# Patient Record
Sex: Female | Born: 1985 | Race: Black or African American | Hispanic: No | Marital: Single | Smoking: Current some day smoker
Health system: Southern US, Community
[De-identification: ages and names within clinical notes are randomized; demographics above are authoritative.]

## PROBLEM LIST (undated history)

## (undated) ENCOUNTER — Inpatient Hospital Stay (HOSPITAL_COMMUNITY): Payer: Self-pay

## (undated) DIAGNOSIS — R569 Unspecified convulsions: Secondary | ICD-10-CM

## (undated) DIAGNOSIS — J45909 Unspecified asthma, uncomplicated: Secondary | ICD-10-CM

## (undated) DIAGNOSIS — R87629 Unspecified abnormal cytological findings in specimens from vagina: Secondary | ICD-10-CM

## (undated) DIAGNOSIS — Z8619 Personal history of other infectious and parasitic diseases: Secondary | ICD-10-CM

## (undated) HISTORY — DX: Personal history of other infectious and parasitic diseases: Z86.19

## (undated) HISTORY — DX: Unspecified abnormal cytological findings in specimens from vagina: R87.629

---
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
## 2006-02-21 ENCOUNTER — Emergency Department (HOSPITAL_COMMUNITY): Admission: EM | Admit: 2006-02-21 | Discharge: 2006-02-21 | Payer: Self-pay | Admitting: Emergency Medicine

## 2006-07-02 ENCOUNTER — Emergency Department (HOSPITAL_COMMUNITY): Admission: EM | Admit: 2006-07-02 | Discharge: 2006-07-02 | Payer: Self-pay | Admitting: Emergency Medicine

## 2006-08-29 ENCOUNTER — Emergency Department (HOSPITAL_COMMUNITY): Admission: EM | Admit: 2006-08-29 | Discharge: 2006-08-29 | Payer: Self-pay | Admitting: Emergency Medicine

## 2006-09-16 ENCOUNTER — Ambulatory Visit (HOSPITAL_COMMUNITY): Admission: EM | Admit: 2006-09-16 | Discharge: 2006-09-16 | Payer: Self-pay | Admitting: Obstetrics and Gynecology

## 2006-09-20 ENCOUNTER — Emergency Department (HOSPITAL_COMMUNITY): Admission: EM | Admit: 2006-09-20 | Discharge: 2006-09-20 | Payer: Self-pay | Admitting: Emergency Medicine

## 2006-10-30 ENCOUNTER — Emergency Department (HOSPITAL_COMMUNITY): Admission: EM | Admit: 2006-10-30 | Discharge: 2006-10-30 | Payer: Self-pay | Admitting: Emergency Medicine

## 2006-11-19 ENCOUNTER — Emergency Department (HOSPITAL_COMMUNITY): Admission: EM | Admit: 2006-11-19 | Discharge: 2006-11-19 | Payer: Self-pay | Admitting: Physician Assistant

## 2007-08-25 ENCOUNTER — Emergency Department (HOSPITAL_COMMUNITY): Admission: EM | Admit: 2007-08-25 | Discharge: 2007-08-25 | Payer: Self-pay | Admitting: Emergency Medicine

## 2007-09-16 ENCOUNTER — Emergency Department (HOSPITAL_COMMUNITY): Admission: EM | Admit: 2007-09-16 | Discharge: 2007-09-16 | Payer: Self-pay | Admitting: Emergency Medicine

## 2007-10-01 ENCOUNTER — Emergency Department (HOSPITAL_COMMUNITY): Admission: EM | Admit: 2007-10-01 | Discharge: 2007-10-01 | Payer: Self-pay | Admitting: Emergency Medicine

## 2008-03-24 ENCOUNTER — Emergency Department (HOSPITAL_COMMUNITY): Admission: EM | Admit: 2008-03-24 | Discharge: 2008-03-24 | Payer: Self-pay | Admitting: Emergency Medicine

## 2008-07-22 ENCOUNTER — Emergency Department (HOSPITAL_COMMUNITY): Admission: EM | Admit: 2008-07-22 | Discharge: 2008-07-22 | Payer: Self-pay | Admitting: Emergency Medicine

## 2008-11-19 ENCOUNTER — Other Ambulatory Visit: Admission: RE | Admit: 2008-11-19 | Discharge: 2008-11-19 | Payer: Self-pay | Admitting: Obstetrics and Gynecology

## 2009-01-20 ENCOUNTER — Other Ambulatory Visit: Payer: Self-pay | Admitting: Emergency Medicine

## 2009-01-20 ENCOUNTER — Inpatient Hospital Stay (HOSPITAL_COMMUNITY): Admission: AD | Admit: 2009-01-20 | Discharge: 2009-01-20 | Payer: Self-pay | Admitting: Obstetrics & Gynecology

## 2009-01-20 ENCOUNTER — Ambulatory Visit: Payer: Self-pay | Admitting: Obstetrics and Gynecology

## 2009-02-20 ENCOUNTER — Ambulatory Visit: Payer: Self-pay | Admitting: Obstetrics and Gynecology

## 2009-02-20 ENCOUNTER — Inpatient Hospital Stay (HOSPITAL_COMMUNITY): Admission: AD | Admit: 2009-02-20 | Discharge: 2009-02-23 | Payer: Self-pay | Admitting: Obstetrics and Gynecology

## 2009-10-10 ENCOUNTER — Emergency Department (HOSPITAL_COMMUNITY): Admission: EM | Admit: 2009-10-10 | Discharge: 2009-10-10 | Payer: Self-pay | Admitting: Emergency Medicine

## 2010-02-11 ENCOUNTER — Ambulatory Visit: Payer: Self-pay | Admitting: Family Medicine

## 2010-02-11 ENCOUNTER — Observation Stay (HOSPITAL_COMMUNITY): Admission: AD | Admit: 2010-02-11 | Discharge: 2010-02-12 | Payer: Self-pay | Admitting: Obstetrics and Gynecology

## 2010-03-26 ENCOUNTER — Inpatient Hospital Stay (HOSPITAL_COMMUNITY)
Admission: AD | Admit: 2010-03-26 | Discharge: 2010-03-26 | Payer: Self-pay | Source: Home / Self Care | Attending: Obstetrics & Gynecology | Admitting: Obstetrics & Gynecology

## 2010-04-06 ENCOUNTER — Inpatient Hospital Stay (HOSPITAL_COMMUNITY)
Admission: RE | Admit: 2010-04-06 | Discharge: 2010-04-09 | Payer: Self-pay | Source: Home / Self Care | Attending: Obstetrics and Gynecology | Admitting: Obstetrics and Gynecology

## 2010-05-03 NOTE — Discharge Summary (Addendum)
  NAMEMERLIN, EGE                ACCOUNT NO.:  [id number]  MEDICAL RECORD NO.:  [id number]          PATIENT TYPE:  INP  LOCATION:  0802                          FACILITY:  [facility]  PHYSICIAN:  Emeterio Reeve, MD       DATE OF BIRTH:  03-Feb-1986  DATE OF ADMISSION:  04/06/2010 DATE OF DISCHARGE:  04/09/2010                              DISCHARGE SUMMARY   ADMITTING DIAGNOSIS:  Admission for repeat cesarean section.  HISTORY OF PRESENT ILLNESS:  The patient is a 25 year old gravida 97, now para 29, female who at 59 weeks estimated gestational age, who was admitted through Short Stay and subsequently postoperatively for a repeat low transverse cesarean section.  The patient has had 4 previous cesarean section, this is her fifth repeat, scheduled.  She had been offered a tubal ligation, but had declined that at the time of this cesarean section.  The several operative report is dictated by Dr. Mallory Shirk.  The patient had a preop evaluation which found her hemoglobin and hematocrit to be 11 and 34 respectively.  A postop hemoglobin and hematocrit on April 07, 2010, was 11.3 and 32.8.  The patient delivered a viable infant female and she is breast feeding, desires an IUD and is agreeable to Depo bridge.  DISCHARGE DIAGNOSIS:  Status post repeat low-transverse cesarean section at term pregnancy which is delivered.  Postoperative complications include none.  PLAN:  The patient will be discharged home on Percocet, Motrin, prenatal vitamins, Colace, and iron.  The patient is asked to follow up in approximately 4 weeks postpartum to [facility], so she can discuss and orient her plan to have her IUD placed.  She agrees to having a Depo shot at the time of discharge.    ______________________________ Lynelle Smoke, DO   ______________________________ Emeterio Reeve, MD    SH/MEDQ  D:  04/09/2010  T:  04/10/2010  Job:  [id number]  Electronically Signed by Lynelle Smoke MD on  05/03/2010 09:13:44 AM Electronically Signed by Emeterio Reeve MD on 05/03/2010 11:42:37 AM

## 2010-06-09 ENCOUNTER — Emergency Department (HOSPITAL_COMMUNITY)
Admission: EM | Admit: 2010-06-09 | Discharge: 2010-06-09 | Disposition: A | Discharge status: Home / Self Care | Payer: Medicaid Other | Attending: Emergency Medicine | Admitting: Emergency Medicine

## 2010-06-09 DIAGNOSIS — IMO0001 Reserved for inherently not codable concepts without codable children: Secondary | ICD-10-CM | POA: Insufficient documentation

## 2010-06-09 DIAGNOSIS — J111 Influenza due to unidentified influenza virus with other respiratory manifestations: Secondary | ICD-10-CM | POA: Insufficient documentation

## 2010-06-09 DIAGNOSIS — R112 Nausea with vomiting, unspecified: Secondary | ICD-10-CM | POA: Insufficient documentation

## 2010-06-09 LAB — URINALYSIS, ROUTINE W REFLEX MICROSCOPIC
Bilirubin Urine: NEGATIVE
Nitrite: NEGATIVE
Protein, ur: NEGATIVE mg/dL
Specific Gravity, Urine: 1.025 (ref 1.005–1.030)
Urine Glucose, Fasting: NEGATIVE mg/dL
pH: 6 (ref 5.0–8.0)

## 2010-06-09 LAB — URINE MICROSCOPIC-ADD ON

## 2010-06-21 LAB — CBC
HCT: 32.8 % — ABNORMAL LOW (ref 36.0–46.0)
Hemoglobin: 11.3 g/dL — ABNORMAL LOW (ref 12.0–15.0)
MCH: 32.8 pg (ref 26.0–34.0)
MCH: 33.4 pg (ref 26.0–34.0)
MCHC: 33.4 g/dL (ref 30.0–36.0)
MCV: 98 fL (ref 78.0–100.0)
Platelets: 197 10*3/uL (ref 150–400)
Platelets: 212 10*3/uL (ref 150–400)
RBC: 3.38 MIL/uL — ABNORMAL LOW (ref 3.87–5.11)
WBC: 10 10*3/uL (ref 4.0–10.5)

## 2010-06-21 LAB — SURGICAL PCR SCREEN
MRSA, PCR: NEGATIVE
Staphylococcus aureus: NEGATIVE

## 2010-06-21 LAB — GC/CHLAMYDIA PROBE AMP, GENITAL
Chlamydia, DNA Probe: POSITIVE — AB
GC Probe Amp, Genital: NEGATIVE

## 2010-06-21 LAB — TYPE AND SCREEN: Antibody Screen: NEGATIVE

## 2010-06-21 LAB — WET PREP, GENITAL
Trich, Wet Prep: NONE SEEN
Yeast Wet Prep HPF POC: NONE SEEN

## 2010-06-22 LAB — COMPREHENSIVE METABOLIC PANEL
ALT: 20 U/L (ref 0–35)
AST: 29 U/L (ref 0–37)
Albumin: 3 g/dL — ABNORMAL LOW (ref 3.5–5.2)
Alkaline Phosphatase: 85 U/L (ref 39–117)
BUN: 3 mg/dL — ABNORMAL LOW (ref 6–23)
Chloride: 109 mEq/L (ref 96–112)
Creatinine, Ser: 0.51 mg/dL (ref 0.4–1.2)
GFR calc Af Amer: >60 mL/min (ref >60)
Sodium: 139 mEq/L (ref 135–145)
Total Protein: 6.7 g/dL (ref 6.0–8.3)

## 2010-06-22 LAB — DIFFERENTIAL
Basophils Relative: 0 % (ref 0–1)
Lymphocytes Relative: 24 % (ref 12–46)
Monocytes Absolute: 0.9 10*3/uL (ref 0.1–1.0)
Neutro Abs: 6.5 10*3/uL (ref 1.7–7.7)

## 2010-06-22 LAB — URINALYSIS, DIPSTICK ONLY
Bilirubin Urine: NEGATIVE
Specific Gravity, Urine: 1.01 (ref 1.005–1.030)
Urobilinogen, UA: 0.2 mg/dL (ref 0.0–1.0)
pH: 6.5 (ref 5.0–8.0)

## 2010-06-22 LAB — CBC
Hemoglobin: 12.2 g/dL (ref 12.0–15.0)
MCH: 33.2 pg (ref 26.0–34.0)
MCV: 97.8 fL (ref 78.0–100.0)
Platelets: 266 10*3/uL (ref 150–400)
WBC: 9.9 10*3/uL (ref 4.0–10.5)

## 2010-06-27 LAB — HCG, QUANTITATIVE, PREGNANCY: hCG, Beta Chain, Quant, S: 69783 m[IU]/mL — ABNORMAL HIGH (ref <5)

## 2010-06-27 LAB — URINALYSIS, ROUTINE W REFLEX MICROSCOPIC
Glucose, UA: NEGATIVE mg/dL
Ketones, ur: NEGATIVE mg/dL
Urobilinogen, UA: 0.2 mg/dL (ref 0.0–1.0)
pH: 6 (ref 5.0–8.0)

## 2010-06-27 LAB — URINE CULTURE

## 2010-06-27 LAB — PREGNANCY, URINE: Preg Test, Ur: POSITIVE

## 2010-06-27 LAB — URINE MICROSCOPIC-ADD ON

## 2010-07-10 ENCOUNTER — Emergency Department (HOSPITAL_COMMUNITY)
Admission: EM | Admit: 2010-07-10 | Discharge: 2010-07-10 | Disposition: A | Discharge status: Home / Self Care | Payer: Medicaid Other | Attending: Emergency Medicine | Admitting: Emergency Medicine

## 2010-07-10 DIAGNOSIS — T783XXA Angioneurotic edema, initial encounter: Secondary | ICD-10-CM | POA: Insufficient documentation

## 2010-07-10 DIAGNOSIS — X58XXXA Exposure to other specified factors, initial encounter: Secondary | ICD-10-CM | POA: Insufficient documentation

## 2010-07-10 DIAGNOSIS — R220 Localized swelling, mass and lump, head: Secondary | ICD-10-CM | POA: Insufficient documentation

## 2010-07-14 LAB — CBC
Hemoglobin: 10.6 g/dL — ABNORMAL LOW (ref 12.0–15.0)
MCV: 102.6 fL — ABNORMAL HIGH (ref 78.0–100.0)
Platelets: 266 10*3/uL (ref 150–400)
RDW: 13.4 % (ref 11.5–15.5)
WBC: 10.7 10*3/uL — ABNORMAL HIGH (ref 4.0–10.5)
WBC: 11.4 10*3/uL — ABNORMAL HIGH (ref 4.0–10.5)

## 2010-07-14 LAB — TYPE AND SCREEN
ABO/RH(D): O POS
Antibody Screen: NEGATIVE

## 2010-07-14 LAB — RPR: RPR Ser Ql: NONREACTIVE

## 2010-07-15 LAB — URINALYSIS, ROUTINE W REFLEX MICROSCOPIC
Nitrite: POSITIVE — AB
Specific Gravity, Urine: 1.01 (ref 1.005–1.030)
Urobilinogen, UA: 0.2 mg/dL (ref 0.0–1.0)
pH: 7 (ref 5.0–8.0)

## 2010-07-15 LAB — URINE MICROSCOPIC-ADD ON

## 2010-07-15 LAB — URINE CULTURE

## 2010-07-21 LAB — URINE CULTURE: Colony Count: >=100000

## 2010-07-21 LAB — URINALYSIS, ROUTINE W REFLEX MICROSCOPIC
Bilirubin Urine: NEGATIVE
Glucose, UA: NEGATIVE mg/dL
Ketones, ur: NEGATIVE mg/dL
Nitrite: NEGATIVE
Protein, ur: >300 mg/dL — AB
Specific Gravity, Urine: >1.03 — ABNORMAL HIGH (ref 1.005–1.030)
Urobilinogen, UA: 0.2 mg/dL (ref 0.0–1.0)
pH: 6 (ref 5.0–8.0)

## 2010-07-21 LAB — PREGNANCY, URINE: Preg Test, Ur: POSITIVE

## 2010-07-21 LAB — URINE MICROSCOPIC-ADD ON

## 2010-08-24 NOTE — Consult Note (Signed)
NAME:  Janet Dixon, Janet Dixon                ACCOUNT NO.:  [id number]   MEDICAL RECORD NO.:  [id number]          PATIENT TYPE:  OBV   LOCATION:  LDR2                          FACILITY:  [facility]   PHYSICIAN:  Jonnie Kind, M.D. DATE OF BIRTH:  11-Dec-1985   DATE OF CONSULTATION:  DATE OF DISCHARGE:  09/16/2006                                 CONSULTATION   HISTORY OF PRESENT ILLNESS:  A 25 year old gravida 3, para 1, prior C-  section x2 at approximately 16 weeks, 6 days pregnancy, presents with  acute onset of abdominal pain while incarcerated for an FTA (failure to  appear.) She developed pain and discomfort, became hyper-ventilatory and  due to the perceived discomfort, is brought via EMS to the emergency  room and brought to labor delivery. She denies bleeding, gushing fluid,  or any vaginal changes. The pain is in the suprapubic area. Abdomen is  mildly uncomfortable in the upper abdomen, attributable by patient of  her stomach upset earlier today. There is no CVA tenderness. The  uterus itself was not tender but suprapubic area around the area of the  incision is more tender. The fetal heart rate is documented in normal  range. Pelvic exam deferred. Urinalysis shows grossly positive  contamination, malodorous, indicating urinary tract infection. Will  treat with Macrobid 100 mg b.i.d. x7 days and Tylenol #3 for headache,  status post hyperventilation.   FOLLOWUP:  The patient is being released on her own recognizance by  court and will be going home with the many family members who visited  her.    Addendum:  Urinalysis returns showing + TRICHOMONAS after patient went  home, and will require followup through office at followup appts.  Angelyn Punt      Jonnie Kind, M.D.  Electronically Signed     JVF/MEDQ  D:  09/16/2006  T:  09/16/2006  Job:  [id number]

## 2010-08-27 NOTE — Op Note (Signed)
Janet Dixon, Janet Dixon                ACCOUNT NO.:  [id number]   MEDICAL RECORD NO.:  [id number]          PATIENT TYPE:  INP   LOCATION:  A413                          FACILITY:  [facility]   PHYSICIAN:  Jonnie Kind, M.D. DATE OF BIRTH:  1985/12/01   DATE OF PROCEDURE:  03/22/2005  DATE OF DISCHARGE:                                 OPERATIVE REPORT   PREOPERATIVE DIAGNOSIS:  Pregnancy, 38 weeks 2 days, early labor, prior  Cesarean section, not for trial of labor.   POSTOPERATIVE DIAGNOSIS:  Pregnancy, 38 weeks 2 days, delivered.   PROCEDURE:  Repeat low transverse cervical Cesarean section.   SURGEON:  Jonnie Kind, M.D.   ASSISTANTPhilipp Ovens, C.S.T.   ANESTHESIA:  Spinal, Olena Heckle, C.R.N.A.   COMPLICATIONS:  None.   FINDINGS:  A 6-pound 4.9-ounce female, Apgars 9 and 9, clear amniotic fluid.  Vertex out of pelvis. Thi patient is not a VBAC candidate ever.   DETAILS OF PROCEDURE:  The patient was taken to the operating room, prepped  and draped for lower abdominal surgery after spinal anesthesia was  introduced, Foley catheter was inserted. Transverse lower abdominal incision  was performed, removing the old cicatrix. She has minimal subcutaneous fat.  The fascia was opened transversely, peritoneum opened in the midline, and  bladder flap developed. The bladder was quite high on the lower uterine  segment but can be pulled inferiorly enough to open the transverse lower  uterine segment incision using index finger traction to extend the initial  nick transversely. Fetal vertex was rotated through the incision, required  vacuum extraction to deliver it through the abdominal wall, and the infant  was passed to Dr. Micah Flesher. Halm in excellent condition. Apgars of 9 and 9  were assigned; see his notes for further details.   Cord blood samples were obtained including a blood gas and the placenta  delivered intact, crede massage with central insertion of the cord into the  placenta, three-vessel cord being confirmed.   Uterine irrigation with antibiotic solution was performed. The uterus had  minimal bleeding during this portion of the procedure. The uterine incision  was closed using single layer of running locking closure 0 chromic with good  hemostasis. Two-0 chromic was used to reapproximate the bladder. Anterior  peritoneum was closed with 2-0 chromic, the fascia closed with 0 Vicryl. The  subcutaneous tissue approximated with three interrupted sutures of 2-0 plain  and then stable closure of the skin completed the procedure. Estimated blood  loss was very low at 450 cc.      Jonnie Kind, M.D.  Electronically Signed     JVF/MEDQ  D:  03/22/2005  T:  03/22/2005  Job:  [id number]   cc:   [place]

## 2010-08-27 NOTE — Op Note (Signed)
NAME:  Janet Dixon, CONDRON                          ACCOUNT NO.:  [id number]   MEDICAL RECORD NO.:  [id number]                   PATIENT TYPE:  INP   LOCATION:  A403                                 FACILITY:  [facility]   PHYSICIAN:  Jonnie Kind, M.D.              DATE OF BIRTH:  29-Sep-1985   DATE OF PROCEDURE:  09/08/2003  DATE OF DISCHARGE:                                 OPERATIVE REPORT   PREOPERATIVE DIAGNOSES:  Pregnancy, 40-1/[redacted] weeks gestation, failure to  progress, (failed induction).  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   POSTOPERATIVE DIAGNOSES:  Pregnancy, 40-1/[redacted] weeks gestation, failure to  progress, (failed induction).  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   PROCEDURE:  Primary low transverse cervical cesarean section.   SURGEON:  Jonnie Kind, M.D.   ASSISTANT:  None.   ANESTHESIA:  Spinal, Lovell Sheehan, CRNA.   COMPLICATIONS:  None.   FINDINGS:  Healthy female infant, 7 pounds 8.1 ounces.  Apgars 9 and 9.   INDICATIONS:  A 25 year old female admitted the night before for induction  of labor, given Cytotec to improve a prodromal labor pattern.  She was begun  on Pitocin induction on the morning at 4 a.m. and by 7 a.m., the baby was  not tolerating labor with possible lates noted by the nurses.  Given that no  absolutely no cervical change has occurred despite good and strong non  amenable contractions, we felt like this confirmed our prior suspected  pelvic disproportion.  She was taken therefore to the operating room.  The  vertex never entered the pelvis.   DETAILS OF PROCEDURE:  The patient was taken to the operating room, prepped  and draped in the usual fashion with the Foley catheter in place.  Spinal  anesthesia was introduced and we used a Pfannenstiel-type incision was  performed with a bladder flap developed anteriorly.  The lower uterine  segment was extremely well developed and thinned  out.  A transverse incision  was made, extended laterally using index finger traction and the fetal  vertex rotated into the incision and guided by vacuum extractor while fundal  pressure was applied.  The patient tolerated the procedure well and was  delivered easily.  Cord was clamped.  The infant passed to Dr. Anitra Lauth for  subsequent care.  See his notes dictated elsewhere.   Placenta delivered intact.  She also at presentation membranes removed  intact.  The uterine irrigation was followed by a single layer of running  locking closure of the bladder flap.  The incision was quite low just inside  the internal os.  A 2-0 chromic closure of the bladder flap was performed  next.  The abdomen again irrigated as the uterus had  been, then we closed the anterior peritoneum with 2-0 chromic, the fascia  with 0 Vicryl, the subcutaneous fatty tissues with interrupted 2-0 plain and  the staple closure of the skin completed the procedure.  The patient  tolerated things well, went to the recovery room in good condition.      ___________________________________________                                            Jonnie Kind, M.D.   JVF/MEDQ  D:  09/08/2003  T:  09/08/2003  Job:  [id number]   cc:   Tammi Sou, M.D.  [place]  [place] [address]  Fax: [phone]

## 2010-08-27 NOTE — H&P (Signed)
Janet Dixon, Janet Dixon                ACCOUNT NO.:  [id number]   MEDICAL RECORD NO.:  [id number]          PATIENT TYPE:  OIB   LOCATION:  LDR2                          FACILITY:  [facility]   PHYSICIAN:  Jonnie Kind, M.D. DATE OF BIRTH:  08-20-1985   DATE OF ADMISSION:  12/22/2004  DATE OF DISCHARGE:  LH                                HISTORY & PHYSICAL   REASON FOR ADMISSION:  Pregnancy at 25 weeks with headache.  States that  headache is in the temporal area.  It began this morning.  She was nauseated  from it earlier, but since admission and being in a darkened room, headache  has improved.  The patient was given two Tylox approximately 5 minutes ago  and has yet to be able to see if it is going to completely abate the  headache at present time.  She is positive for GC in August and has yet to  take her medication, so we are going to give her Rocephin 500 IM and her  significant other was incarcerated in the prison system, and I discussed  with her the very high risk of HIV in the prison system and the fact that  she has a sexually transmitted disease further increases her risk for HIV,  that she needed to be tested now and again in six months.  Also, her partner  needs to be tested and again in six months.  She needs to make her partner  wear a condom.   PHYSICAL EXAMINATION:  VITAL SIGNS:  Stable.  GENERAL APPEARANCE:  Alert and oriented x3.   PLAN:  We are going to Rocephin 500 IM. Gave her two Tylox for the headache  which is already improving and discharge her home in one hour.      Daiva Nakayama, Tonita Phoenix      Jonnie Kind, M.D.  Electronically Signed    DL/MEDQ  D:  12/22/2004  T:  12/22/2004  Job:  [id number]   cc:   [facility]

## 2010-08-27 NOTE — H&P (Signed)
NAME:  Janet Dixon, CAGE                          ACCOUNT NO.:  [id number]   MEDICAL RECORD NO.:  [id number]                   PATIENT TYPE:  INP   LOCATION:  A417                                 FACILITY:  [facility]   PHYSICIAN:  Jonnie Kind, M.D.              DATE OF BIRTH:  [birth year]   DATE OF ADMISSION:  09/07/2003  DATE OF DISCHARGE:                                HISTORY & PHYSICAL   ADMISSION DIAGNOSES:  1. Pregnancy at 40-1/[redacted] weeks gestation.  2. Impending post dates.  3. Borderline pelvis.  4. Uncertain prognosis for vaginal delivery.   HISTORY OF PRESENT ILLNESS:  This 25 year old female gravida 1, para 0, AB  0, LMP November 12, 2002 ?, placing menstrual EDC ? Aug 19, 2003.  Ultrasound  [facility] Sep 04, 2003 was based on 22-week ultrasound, which was patient's  initial prenatal care in January.  The patient is now 40-1/2 weeks by that  ultrasound criteria.  She has an adolescent pelvis.  The presenting part is  at the pelvic inlet.  The estimated fetal weight is 7-1/2 to 8 pounds.  She  is admitted to attempt to put her into labor.  Given the clinically  suspicious elevation of the presenting parts, start of labor, cervix closed,  -3, ballottable vertex, our plans are to attempt Cytotec tonight x 2 and  Pitocin in the a.m.  If evidence of inadequate potential for vaginal  delivery occurs, we have discussed the potential for cesarean section.   PAST MEDICAL HISTORY:  Benign.   PAST SURGICAL HISTORY:  Negative.   ALLERGIES:  No known drug allergies.   HABITS:  Cigarettes:  1 to 2 cigarettes per day.  Alcohol and recreational  drugs denied.   PHYSICAL EXAMINATION:  VITAL SIGNS:  Height 5 feet 1 inch, weight 175 which  is a 27-pound weight gain.  Blood pressure 120/70.  GENERAL:  Healthy-appearing, anxious African-American female.  Alert and  oriented x 3.  PELVIC:  Fundal height 40 cm, vertex and presenting part high.  There is a  flat sensitivus with the presenting part at  the pelvic inlet.  Cervix is  soft, closed, -3, ballottable, vertex.   LABORATORY DATA:  Blood type O positive. Rubella immunity present.  Hemoglobin 11, hematocrit 35.  Hepatitis, HIV, GC, chlamydia all negative.  Group B strep negative.  Glucose tolerance test 85 mg at 28 weeks.   PLAN:  The patient plans to bottle feed, and baby will go to Dr. Karie Kirks  after hospital care.  The patient did not attend childbirth classes and is  quite anxious.     ___________________________________________                                         Jonnie Kind, M.D.   JVF/MEDQ  D:  09/07/2003  T:  09/07/2003  Job:  [id number]   cc:   Tammi Sou, M.D.  [place]  [place] [address]  Fax: [place] Karie Kirks, M.D.  [address] [place], [place] [address]  Fax: [phone]

## 2010-08-27 NOTE — Discharge Summary (Signed)
NAMESERENNA, DEROY                ACCOUNT NO.:  [id number]   MEDICAL RECORD NO.:  [id number]          PATIENT TYPE:  INP   LOCATION:  A413                          FACILITY:  [facility]   PHYSICIAN:  Jonnie Kind, M.D. DATE OF BIRTH:  [birth year]   DATE OF ADMISSION:  03/22/2005  DATE OF DISCHARGE:  12/15/2006LH                                 DISCHARGE SUMMARY   ADMISSION DIAGNOSES:  Pregnancy, 38-1/[redacted] weeks gestation, repeat Cesarean  section, not for trial of labor, early labor.   DISCHARGE DIAGNOSES:  Pregnancy, 38-1/[redacted] weeks gestation, repeat Cesarean  section, not for trial of labor, early labor, delivered.   PROCEDURE:  March 22, 2005, repeat low transverse cervical Cesarean  section, delivering a healthy female infant, Apgars 9 and 9.   DISCHARGE MEDICATIONS:  1.  Tylox 1 q.4h. p.r.n. pain, dispensed 20.  2.  Motrin 800 mg 30 tablets 1 q.8h. for cramps and discomfort.   FOLLOW UP:  Follow up in three days for staple removal and incision check.  The patient is breast feeding. Contraceptive undecided.   HOSPITAL SUMMARY:  An 25 year old female scheduled for Cesarean section on  December 14, presented instead  in early labor on the morning of March 22, 2005. Received terbutaline doses x2, was subsequently taken to the OR 6  a.m. on December 12 for repeat Cesarean section. Infant was a healthy female  infant, Apgars 9 and 9.   EBL 450 cc at Cesarean section. Hemoglobin preoperative was 12.7, hematocrit  37.6; postoperative was 11.4 and 32.5% with normal white count. She had  excellent connection with the baby. Her partner was very supportive. Breast  feeding went well. She was stable for discharge on postoperative day #3 for  followup in routine fashion in three days.      Jonnie Kind, M.D.  Electronically Signed     JVF/MEDQ  D:  03/25/2005  T:  03/25/2005  Job:  [id number]   cc:   [facility]

## 2010-08-27 NOTE — Discharge Summary (Signed)
NAME:  Janet Dixon, Janet Dixon                          ACCOUNT NO.:  [id number]   MEDICAL RECORD NO.:  [id number]                   PATIENT TYPE:  INP   LOCATION:  A403                                 FACILITY:  [facility]   PHYSICIAN:  Jonnie Kind, M.D.              DATE OF BIRTH:  Mar 24, 1986   DATE OF ADMISSION:  09/07/2003  DATE OF DISCHARGE:  09/10/2003                                 DISCHARGE SUMMARY   ADMISSION DIAGNOSES:  1. Pregnancy at 40-1/2 weeks' gestation impending postdates.  2. Borderline pelvis with uncertain prognosis for vaginal delivery.   DISCHARGE DIAGNOSES:  1. Pregnancy at 40-1/2 weeks' gestation, delivered, impending postdates.  2. 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   PROCEDURES:  1. Cytotec cervical ripening, unsuccessful.  2. Pitocin induction of labor.  3. Primary low transverse Cesarean section on Sep 08, 2003.   HOSPITAL COURSE:  A 25 year old, primiparous female at 40-1/2 weeks'  gestation was admitted to attempt cervical ripening even though her  prognosis for delivery was doubtful.  She is 5 feet 1 inch, 175 pounds with  a narrow pelvis and a very flat symphysis pubis.  Efforts at cervical  ripening with Cytotec were performed and then Pitocin begun in the morning.  The patient and the baby did not tolerate the Pitocin with a good  contraction resulting in decelerations in the fetal heart rate.  This was  felt due to head compression on the pelvic inlet.  She was taken to the OR  where a Cesarean section was performed showing the very thinned out lower  uterine segment complete with closed cervix.  Vertex never really entered  the pelvic inlet.  A healthy infant was delivered with Apgar's 9 and 9,  weight 7 pounds 8.1 ounces.   It is felt that she would not be a candidate for vaginal birth after  Cesarean in the future based on the clinical presentation and outcome at  this time.   Postpartum course was uneventful with the  patient stable for discharge on  postpartum day #2, tolerating a regular diet, bottle-feeding with maternal  blood type O positive and antibody screen negative.  Discharge hemoglobin 12  and hematocrit 35.  Mother and infant were in good condition with good  family support ordered.  Staple removal in five days.     ___________________________________________                                         Jonnie Kind, M.D.   JVF/MEDQ  D:  09/10/2003  T:  09/10/2003  Job:  [id number]   cc:   Tammi Sou, M.D.  [place]  [place] [address]  Fax: [phone]

## 2010-08-27 NOTE — H&P (Signed)
Janet Dixon, Janet Dixon                ACCOUNT NO.:  [id number]   MEDICAL RECORD NO.:  [id number]          PATIENT TYPE:  INP   LOCATION:  A413                          FACILITY:  [facility]   PHYSICIAN:  Jonnie Kind, M.D. DATE OF BIRTH:  12/21/1985   DATE OF ADMISSION:  DATE OF DISCHARGE:  LH                                HISTORY & PHYSICAL   ADMITTING DIAGNOSIS:  Repeat cesarean section, pregnancy 38.5 weeks'  gestation.   DATE OF SURGERY:  March 24, 2005 at [facility].   HISTORY OF PRESENT ILLNESS:  This 25 year old female, gravida 2, para 1,  AB0, LMP June 27, 2004, placing [facility] at April 03, 2005, with an  ultrasound suggesting [facility] of March 26, 2005 and March 26, 2005 based  on first trimester scans.  She is admitted after pregnancy course followed  through ten prenatal visits with appropriate weight gain and fundal  height/growth.  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  She is not considered a candidate for attempted VBAC.  She  understands the technical aspects of the procedure.   PAST MEDICAL HISTORY:  Benign.   SURGICAL HISTORY:  C-section in 2005, vertex never entered the pelvis.   ALLERGIES:  None known.   SOCIAL HISTORY:  Single.  Partner Bufford Spikes, this is his second also,  age 41.   PRENATAL LABORATORY:  Include blood type O positive.  Urine drug screen  positive for THC initially.  Rubella immunity present.  Hemoglobin 12,  hematocrit 37.  Hepatitis, HIV, Chlamydia all negative.  HSV-2 negative.  INITIAL GONORRHEA CULTURE WAS POSITIVE WITH THE PATIENT TREATED, PROOF OF  CURE NEGATIVE AND SUBSEQUENT NEGATIVE CULTURE, February 15, 2005.   PHYSICAL EXAMINATION:  VITAL SIGNS:  Height 5 feet 3 inches, weight 144,  blood pressure 98/60.  GENERAL:  Shows a petite, small framed, African American female, alert.  HEENT:  Pupils equal, round, and reactive.  Extraocular movements intact.  CARDIOVASCULAR:  Unremarkable.  ABDOMEN:  With a 37-cm fundal height.  Vertex presentation, ballotable.  Easily palpable above symphysis pubis.  GENITOURINARY:  External genitalia within normal limits.  PELVIC:  Deferred at this time but negative cultures at 33 weeks and in  August.   PLAN:  Repeat cesarean section, spinal anesthesia - March 24, 2005.      Jonnie Kind, M.D.  Electronically Signed     JVF/MEDQ  D:  03/15/2005  T:  03/15/2005  Job:  [id number]   cc:   Jonnie Kind, M.D.  Fax: [phone]

## 2011-01-05 LAB — URINALYSIS, ROUTINE W REFLEX MICROSCOPIC
Bilirubin Urine: NEGATIVE
Nitrite: POSITIVE — AB
Specific Gravity, Urine: 1.025
Urobilinogen, UA: 0.2

## 2011-01-05 LAB — URINE MICROSCOPIC-ADD ON

## 2011-01-05 LAB — PREGNANCY, URINE: Preg Test, Ur: NEGATIVE

## 2011-01-06 LAB — URINALYSIS, ROUTINE W REFLEX MICROSCOPIC
Glucose, UA: NEGATIVE
Ketones, ur: NEGATIVE
pH: 6

## 2011-01-06 LAB — URINE MICROSCOPIC-ADD ON

## 2011-01-13 LAB — URINALYSIS, ROUTINE W REFLEX MICROSCOPIC
Glucose, UA: NEGATIVE mg/dL
Specific Gravity, Urine: 1.025 (ref 1.005–1.030)
pH: 7.5 (ref 5.0–8.0)

## 2011-01-13 LAB — URINE CULTURE: Colony Count: >=100000

## 2011-01-13 LAB — URINE MICROSCOPIC-ADD ON

## 2011-01-24 LAB — BASIC METABOLIC PANEL
BUN: 3 — ABNORMAL LOW
Chloride: 108
Glucose, Bld: 79
Potassium: 3.8
Sodium: 133 — ABNORMAL LOW

## 2011-01-24 LAB — CBC
HCT: 35.8 — ABNORMAL LOW
Hemoglobin: 12.1
MCV: 97.8
Platelets: 283
RDW: 13.8
WBC: 13.4 — ABNORMAL HIGH

## 2011-01-24 LAB — URINALYSIS, ROUTINE W REFLEX MICROSCOPIC
Bilirubin Urine: NEGATIVE
Ketones, ur: NEGATIVE
Nitrite: POSITIVE — AB
Urobilinogen, UA: 0.2

## 2011-01-24 LAB — DIFFERENTIAL
Eosinophils Absolute: 0.1
Eosinophils Relative: 1
Lymphs Abs: 2
Monocytes Absolute: 0.7

## 2011-01-24 LAB — URINE CULTURE

## 2011-01-27 LAB — URINALYSIS, ROUTINE W REFLEX MICROSCOPIC
Glucose, UA: NEGATIVE
Hgb urine dipstick: NEGATIVE
Hgb urine dipstick: NEGATIVE
Nitrite: POSITIVE — AB
Specific Gravity, Urine: 1.025
Specific Gravity, Urine: >1.03 — ABNORMAL HIGH
Urobilinogen, UA: 1
Urobilinogen, UA: 1
pH: 6
pH: 6.5

## 2011-01-27 LAB — CBC
MCHC: 34.9
MCV: 96
Platelets: 374
WBC: 8

## 2011-01-27 LAB — DIFFERENTIAL
Eosinophils Relative: 1
Lymphocytes Relative: 24
Lymphs Abs: 1.9
Monocytes Absolute: 0.5

## 2011-01-27 LAB — RAPID URINE DRUG SCREEN, HOSP PERFORMED
Amphetamines: NOT DETECTED
Barbiturates: NOT DETECTED

## 2011-01-27 LAB — URINE MICROSCOPIC-ADD ON

## 2011-01-27 LAB — COMPREHENSIVE METABOLIC PANEL
AST: 21
Albumin: 3.1 — ABNORMAL LOW
Calcium: 9
Creatinine, Ser: 0.5
GFR calc Af Amer: >60

## 2011-07-01 ENCOUNTER — Emergency Department (HOSPITAL_COMMUNITY)
Admission: EM | Admit: 2011-07-01 | Discharge: 2011-07-02 | Disposition: A | Discharge status: Home / Self Care | Payer: Self-pay | Attending: Emergency Medicine | Admitting: Emergency Medicine

## 2011-07-01 ENCOUNTER — Encounter (HOSPITAL_COMMUNITY): Payer: Self-pay | Admitting: *Deleted

## 2011-07-01 DIAGNOSIS — A499 Bacterial infection, unspecified: Secondary | ICD-10-CM | POA: Insufficient documentation

## 2011-07-01 DIAGNOSIS — F17200 Nicotine dependence, unspecified, uncomplicated: Secondary | ICD-10-CM | POA: Insufficient documentation

## 2011-07-01 DIAGNOSIS — R109 Unspecified abdominal pain: Secondary | ICD-10-CM | POA: Insufficient documentation

## 2011-07-01 DIAGNOSIS — R300 Dysuria: Secondary | ICD-10-CM | POA: Insufficient documentation

## 2011-07-01 DIAGNOSIS — R350 Frequency of micturition: Secondary | ICD-10-CM | POA: Insufficient documentation

## 2011-07-01 DIAGNOSIS — N390 Urinary tract infection, site not specified: Secondary | ICD-10-CM | POA: Insufficient documentation

## 2011-07-01 DIAGNOSIS — N760 Acute vaginitis: Secondary | ICD-10-CM | POA: Insufficient documentation

## 2011-07-01 DIAGNOSIS — R3915 Urgency of urination: Secondary | ICD-10-CM | POA: Insufficient documentation

## 2011-07-01 DIAGNOSIS — B9689 Other specified bacterial agents as the cause of diseases classified elsewhere: Secondary | ICD-10-CM | POA: Insufficient documentation

## 2011-07-01 NOTE — ED Notes (Signed)
Pt reports bilateral flank pain and whitish vag d/c for the past 3 week, pt also thinks she might be pregnant

## 2011-07-02 LAB — URINALYSIS, ROUTINE W REFLEX MICROSCOPIC
Bilirubin Urine: NEGATIVE
Glucose, UA: NEGATIVE mg/dL
pH: 6 (ref 5.0–8.0)

## 2011-07-02 LAB — URINE MICROSCOPIC-ADD ON

## 2011-07-02 LAB — WET PREP, GENITAL: Yeast Wet Prep HPF POC: NONE SEEN

## 2011-07-02 MED ORDER — HYDROCODONE-ACETAMINOPHEN 5-325 MG PO TABS: 2.0000 | ORAL_TABLET | ORAL | Status: AC | PRN

## 2011-07-02 MED ORDER — METRONIDAZOLE 500 MG PO TABS: 500.0000 mg | ORAL_TABLET | Freq: Two times a day (BID) | ORAL | Status: AC

## 2011-07-02 MED ORDER — SULFAMETHOXAZOLE-TMP DS 800-160 MG PO TABS
1.0000 | ORAL_TABLET | Freq: Once | ORAL | Status: AC
  Administered 2011-07-02: 1 via ORAL
  Filled 2011-07-02: qty 1

## 2011-07-02 MED ORDER — METRONIDAZOLE 500 MG PO TABS
500.0000 mg | ORAL_TABLET | Freq: Once | ORAL | Status: AC
  Administered 2011-07-02: 500 mg via ORAL
  Filled 2011-07-02: qty 1

## 2011-07-02 MED ORDER — SULFAMETHOXAZOLE-TMP DS 800-160 MG PO TABS: 1.0000 | ORAL_TABLET | Freq: Two times a day (BID) | ORAL | Status: AC

## 2011-07-02 NOTE — Discharge Instructions (Signed)
Bacterial Vaginosis Bacterial vaginosis (BV) is a vaginal infection where the normal balance of bacteria in the vagina is disrupted. The normal balance is then replaced by an overgrowth of certain bacteria. There are several different kinds of bacteria that can cause BV. BV is the most common vaginal infection in women of childbearing age. CAUSES   The cause of BV is not fully understood. BV develops when there is an increase or imbalance of harmful bacteria.   Some activities or behaviors can upset the normal balance of bacteria in the vagina and put women at increased risk including:   Having a new sex partner or multiple sex partners.   Douching.   Using an intrauterine device (IUD) for contraception.   It is not clear what role sexual activity plays in the development of BV. However, women that have never had sexual intercourse are rarely infected with BV.  Women do not get BV from toilet seats, bedding, swimming pools or from touching objects around them.  SYMPTOMS   Grey vaginal discharge.   A fish-like odor with discharge, especially after sexual intercourse.   Itching or burning of the vagina and vulva.   Burning or pain with urination.   Some women have no signs or symptoms at all.  DIAGNOSIS  Your caregiver must examine the vagina for signs of BV. Your caregiver will perform lab tests and look at the sample of vaginal fluid through a microscope. They will look for bacteria and abnormal cells (clue cells), a pH test higher than 4.5, and a positive amine test all associated with BV.  RISKS AND COMPLICATIONS   Pelvic inflammatory disease (PID).   Infections following gynecology surgery.   Developing HIV.   Developing herpes virus.  TREATMENT  Sometimes BV will clear up without treatment. However, all women with symptoms of BV should be treated to avoid complications, especially if gynecology surgery is planned. Female partners generally do not need to be treated. However,  BV may spread between female sex partners so treatment is helpful in preventing a recurrence of BV.   BV may be treated with antibiotics. The antibiotics come in either pill or vaginal cream forms. Either can be used with nonpregnant or pregnant women, but the recommended dosages differ. These antibiotics are not harmful to the baby.   BV can recur after treatment. If this happens, a second round of antibiotics will often be prescribed.   Treatment is important for pregnant women. If not treated, BV can cause a premature delivery, especially for a pregnant woman who had a premature birth in the past. All pregnant women who have symptoms of BV should be checked and treated.   For chronic reoccurrence of BV, treatment with a type of prescribed gel vaginally twice a week is helpful.  HOME CARE INSTRUCTIONS   Finish all medication as directed by your caregiver.   Do not have sex until treatment is completed.   Tell your sexual partner that you have a vaginal infection. They should see their caregiver and be treated if they have problems, such as a mild rash or itching.   Practice safe sex. Use condoms. Only have 1 sex partner.  PREVENTION  Basic prevention steps can help reduce the risk of upsetting the natural balance of bacteria in the vagina and developing BV:  Do not have sexual intercourse (be abstinent).   Do not douche.   Use all of the medicine prescribed for treatment of BV, even if the signs and symptoms go away.  Tell your sex partner if you have BV. That way, they can be treated, if needed, to prevent reoccurrence.  SEEK MEDICAL CARE IF:   Your symptoms are not improving after 3 days of treatment.   You have increased discharge, pain, or fever.  MAKE SURE YOU:   Understand these instructions.   Will watch your condition.   Will get help right away if you are not doing well or get worse.  FOR MORE INFORMATION  Division of STD Prevention (DSTDP), Centers for Disease  Control and Prevention: [employer] [place] (ASHA): www.ashastd.org  Document Released: 03/28/2005 Document Revised: 03/17/2011 Document Reviewed: 09/18/2008 [facility] Patient Information 2012 [place].Urinary Tract Infection Infections of the urinary tract can start in several places. A bladder infection (cystitis), a kidney infection (pyelonephritis), and a prostate infection (prostatitis) are different types of urinary tract infections (UTIs). They usually get better if treated with medicines (antibiotics) that kill germs. Take all the medicine until it is gone. You or your child may feel better in a few days, but TAKE ALL MEDICINE or the infection may not respond and may become more difficult to treat. HOME CARE INSTRUCTIONS   Drink enough water and fluids to keep the urine clear or pale yellow. Cranberry juice is especially recommended, in addition to large amounts of water.   Avoid caffeine, tea, and carbonated beverages. They tend to irritate the bladder.   Alcohol may irritate the prostate.   Only take over-the-counter or prescription medicines for pain, discomfort, or fever as directed by your caregiver.  To prevent further infections:  Empty the bladder often. Avoid holding urine for long periods of time.   After a bowel movement, women should cleanse from front to back. Use each tissue only once.   Empty the bladder before and after sexual intercourse.  FINDING OUT THE RESULTS OF YOUR TEST Not all test results are available during your visit. If your or your child's test results are not back during the visit, make an appointment with your caregiver to find out the results. Do not assume everything is normal if you have not heard from your caregiver or the medical facility. It is important for you to follow up on all test results. SEEK MEDICAL CARE IF:   There is back pain.   Your baby is older than 3 months with a rectal temperature of 100.5  F (38.1 C) or higher for more than 1 day.   Your or your child's problems (symptoms) are no better in 3 days. Return sooner if you or your child is getting worse.  SEEK IMMEDIATE MEDICAL CARE IF:   There is severe back pain or lower abdominal pain.   You or your child develops chills.   You have a fever.   Your baby is older than 3 months with a rectal temperature of 102 F (38.9 C) or higher.   Your baby is 34 months old or younger with a rectal temperature of 100.4 F (38 C) or higher.   There is nausea or vomiting.   There is continued burning or discomfort with urination.  MAKE SURE YOU:   Understand these instructions.   Will watch your condition.   Will get help right away if you are not doing well or get worse.  Document Released: 01/05/2005 Document Revised: 03/17/2011 Document Reviewed: 08/10/2006 [facility] Patient Information 2012 [place].  RESOURCE GUIDE  Dental Problems  Patients with Medicaid: [facility]  [place] Phone:  [phone]                                                  Phone:  [phone]  If unable to pay or uninsured, contact:  [facility] or [facility]. to become qualified for the adult dental clinic.  Chronic Pain Problems Contact [facility]  [phone] Patients need to be referred by their primary care doctor.  Insufficient Money for Medicine Contact [employer]:  call "211" or [place] [phone].  No Primary Care Doctor Call [facility]  [phone] Other agencies that provide inexpensive medical care    [place]  [phone]    [facility]  [place]  [phone]    [facility]  [phone]    [facility]  [place]   [place]  [phone] [place]   [phone] (emergency services [phone])  Substance Abuse [facility]  [phone] [facility] [phone] [facility] [place] [phone] [place] [phone] Residential & Outpatient Substance Abuse Program  [phone]  Abuse/Neglect [place] [phone] [place] [phone] (After Hours)  Emergency [place] [phone]  [place] [facility] the [place] [phone] [place] [phone]  MRSA Hotline #:   475-502-4824    [facility] Dept. [address]      [place] Phone:  [phone]                                   Phone:  [phone]                 Phone:  [place] Phone:  [place] [phone] (After Hours)

## 2011-07-02 NOTE — ED Provider Notes (Signed)
History     CSN: [id number]  Arrival date & time 07/01/11  2314   First MD Initiated Contact with Patient 07/02/11 0112      Chief Complaint  Patient presents with  . Flank Pain    (Consider location/radiation/quality/duration/timing/severity/associated sxs/prior treatment) Patient is a 26 y.o. female presenting with dysuria and vaginal discharge. The history is provided by the patient.  Dysuria  This is a new problem. The current episode started more than 1 week ago. The problem occurs every urination. The problem has been gradually worsening. The quality of the pain is described as burning and aching. The pain is moderate. There has been no fever. She is sexually active. There is no history of pyelonephritis. Associated symptoms include discharge, frequency, possible pregnancy and urgency. Pertinent negatives include no chills, no sweats, no nausea, no vomiting, no hematuria, no hesitancy and no flank pain. She has tried nothing for the symptoms.  Vaginal Discharge This is a new problem. The current episode started more than 1 week ago. The problem occurs constantly. The problem has not changed since onset.Pertinent negatives include no chest pain, no abdominal pain, no headaches and no shortness of breath. The symptoms are aggravated by nothing. She has tried nothing for the symptoms.    History reviewed. No pertinent past medical history.  Past Surgical History  Procedure Date  . Cesarean section     No family history on file.  History  Substance Use Topics  . Smoking status: Current Everyday Smoker  . Smokeless tobacco: Not on file  . Alcohol Use: No    OB History    Grav Para Term Preterm Abortions TAB SAB Ect Mult Living                  Review of Systems  Constitutional: Negative for fever, chills, diaphoresis, activity change, appetite change and fatigue.  HENT: Negative for sore throat.   Eyes: Negative for discharge, redness and itching.  Respiratory:  Negative.  Negative for shortness of breath.   Cardiovascular: Negative.  Negative for chest pain.  Gastrointestinal: Negative for nausea, vomiting, abdominal pain, diarrhea and constipation.  Genitourinary: Positive for dysuria, urgency, frequency and vaginal discharge. Negative for hesitancy, hematuria, flank pain, vaginal bleeding, difficulty urinating and vaginal pain.  Musculoskeletal: Negative for back pain and joint swelling.  Skin: Negative.   Neurological: Negative for headaches.  Psychiatric/Behavioral: Negative.     Allergies  Benadryl allergy; Flexeril; and Motrin ib  Home Medications   Current Outpatient Rx  Name Route Sig Dispense Refill  . ALBUTEROL IN Inhalation Inhale into the lungs.    Marland Kitchen HYDROCODONE-ACETAMINOPHEN 5-325 MG PO TABS Oral Take 2 tablets by mouth every 4 (four) hours as needed for pain. 12 tablet 0  . METRONIDAZOLE 500 MG PO TABS Oral Take 1 tablet (500 mg total) by mouth 2 (two) times daily. For 7 days to treat bacterial vaginosis 14 tablet 0  . SULFAMETHOXAZOLE-TMP DS 800-160 MG PO TABS Oral Take 1 tablet by mouth 2 (two) times daily. For seven days to treat urinary tract infection 14 tablet 0    BP 122/60  Pulse 60  Temp(Src) 97.8 F (36.6 C) (Oral)  Resp 20  Ht 5' 2"  (1.575 m)  Wt 138 lb (62.596 kg)  BMI 25.24 kg/m2  SpO2 100%  LMP 03/29/2011  Physical Exam  Nursing note and vitals reviewed. Constitutional: She is oriented to person, place, and time. She appears well-nourished. No distress.  HENT:  Head: Normocephalic and atraumatic.  Mouth/Throat: Oropharynx is clear and moist.  Eyes: Conjunctivae and EOM are normal. Pupils are equal, round, and reactive to light. Right eye exhibits no discharge. Left eye exhibits no discharge.  Neck: Normal range of motion.  Cardiovascular: Normal rate and regular rhythm.   Pulmonary/Chest: Effort normal and breath sounds normal. No respiratory distress.  Abdominal: Soft. Bowel sounds are normal. She  exhibits no distension, no fluid wave, no ascites and no mass. There is tenderness in the suprapubic area. There is no rigidity, no rebound, no guarding and no CVA tenderness.  Genitourinary: Uterus normal. Cervix exhibits no motion tenderness and no friability. Right adnexum displays no mass, no tenderness and no fullness. Left adnexum displays no mass, no tenderness and no fullness. No erythema, tenderness or bleeding around the vagina. No foreign body around the vagina. No signs of injury around the vagina. Vaginal discharge found.  Musculoskeletal: Normal range of motion. She exhibits no edema and no tenderness.  Neurological: She is alert and oriented to person, place, and time. She exhibits normal muscle tone. Coordination normal.  Skin: Skin is warm and dry. No rash noted. She is not diaphoretic. No erythema. No pallor.  Psychiatric: She has a normal mood and affect. Her behavior is normal.    ED Course  Procedures (including critical care time)  Labs Reviewed  URINALYSIS, ROUTINE W REFLEX MICROSCOPIC - Abnormal; Notable for the following:    Color, Urine AMBER (*) BIOCHEMICALS MAY BE AFFECTED BY COLOR   APPearance CLOUDY (*)    Specific Gravity, Urine >1.030 (*)    Hgb urine dipstick LARGE (*)    Ketones, ur TRACE (*)    Protein, ur 100 (*)    Nitrite POSITIVE (*)    Leukocytes, UA MODERATE (*)    All other components within normal limits  URINE MICROSCOPIC-ADD ON - Abnormal; Notable for the following:    Squamous Epithelial / LPF FEW (*)    Bacteria, UA MANY (*)    All other components within normal limits  WET PREP, GENITAL - Abnormal; Notable for the following:    Clue Cells Wet Prep HPF POC MANY (*)    WBC, Wet Prep HPF POC MANY (*)    All other components within normal limits  POCT PREGNANCY, URINE  URINE CULTURE  GC/CHLAMYDIA PROBE AMP, GENITAL   No results found.   1. Urinary tract infection   2. Bacterial vaginosis       MDM  The patient appears to have an  uncomplicated urinary tract infection or cystitis as well as bacterial vaginosis. The patient's examination does not suggest PID, cervicitis, or pelvic abscess. The patient is well-appearing and nontoxic.        Charlena Cross, MD 07/02/11 [phone]

## 2011-07-04 LAB — URINE CULTURE

## 2011-07-04 LAB — GC/CHLAMYDIA PROBE AMP, GENITAL: Chlamydia, DNA Probe: NEGATIVE

## 2011-07-05 NOTE — ED Notes (Signed)
+   Urine  Treated per protocol MD; Sensitive to same

## 2012-08-14 ENCOUNTER — Emergency Department: Payer: Self-pay | Admitting: Emergency Medicine

## 2012-08-14 LAB — URINALYSIS, COMPLETE
Bilirubin,UR: NEGATIVE
Glucose,UR: NEGATIVE mg/dL (ref 0–75)
Nitrite: POSITIVE
Ph: 6 (ref 4.5–8.0)
Protein: NEGATIVE
RBC,UR: 1 /HPF (ref 0–5)
Specific Gravity: 1.011 (ref 1.003–1.030)
WBC UR: 7 /HPF (ref 0–5)

## 2012-08-14 LAB — CBC
MCH: 32.9 pg (ref 26.0–34.0)
MCHC: 33.8 g/dL (ref 32.0–36.0)
MCV: 98 fL (ref 80–100)
Platelet: 323 10*3/uL (ref 150–440)
RBC: 4.38 10*6/uL (ref 3.80–5.20)
RDW: 14.5 % (ref 11.5–14.5)
WBC: 7.4 10*3/uL (ref 3.6–11.0)

## 2012-09-12 ENCOUNTER — Encounter: Payer: Self-pay | Admitting: *Deleted

## 2012-09-13 ENCOUNTER — Ambulatory Visit: Payer: Self-pay | Admitting: Adult Health

## 2012-09-19 ENCOUNTER — Emergency Department (HOSPITAL_COMMUNITY)
Admission: EM | Admit: 2012-09-19 | Discharge: 2012-09-19 | Disposition: A | Discharge status: Home / Self Care | Payer: Self-pay

## 2012-09-19 NOTE — ED Notes (Signed)
Called for triage no answer

## 2012-09-21 ENCOUNTER — Emergency Department (HOSPITAL_COMMUNITY)
Admission: EM | Admit: 2012-09-21 | Discharge: 2012-09-21 | Discharge status: Against Medical Advice | Payer: Medicaid Other | Attending: Emergency Medicine | Admitting: Emergency Medicine

## 2012-09-21 ENCOUNTER — Encounter (HOSPITAL_COMMUNITY): Payer: Self-pay | Admitting: *Deleted

## 2012-09-21 DIAGNOSIS — M7989 Other specified soft tissue disorders: Secondary | ICD-10-CM | POA: Insufficient documentation

## 2012-09-21 DIAGNOSIS — F17200 Nicotine dependence, unspecified, uncomplicated: Secondary | ICD-10-CM | POA: Insufficient documentation

## 2012-09-21 DIAGNOSIS — Z3202 Encounter for pregnancy test, result negative: Secondary | ICD-10-CM | POA: Insufficient documentation

## 2012-09-21 DIAGNOSIS — M25569 Pain in unspecified knee: Secondary | ICD-10-CM | POA: Insufficient documentation

## 2012-09-21 NOTE — ED Notes (Signed)
Pt c/o left knee pain and swelling x 2 days.

## 2012-09-21 NOTE — ED Notes (Signed)
Patient reports that she would like to come back tomorrow to get her knee assessed.

## 2012-11-01 ENCOUNTER — Ambulatory Visit: Payer: Self-pay | Admitting: Advanced Practice Midwife

## 2013-01-01 ENCOUNTER — Ambulatory Visit: Payer: Self-pay

## 2013-07-18 ENCOUNTER — Telehealth: Payer: Self-pay | Admitting: *Deleted

## 2013-07-18 NOTE — Telephone Encounter (Signed)
Pt states has had 5 c-section (2005-2011). Pt states desires pregnancy x 2.5 years with no success. Call transferred to front staff for an appointment to be made.

## 2013-07-18 NOTE — Telephone Encounter (Addendum)
Pt state had c-section in 2011, placed on hold and line got disconnected. Attempted to return dropped call x 1 with no answer/mail box full.

## 2013-11-04 ENCOUNTER — Ambulatory Visit: Payer: Self-pay | Admitting: Obstetrics and Gynecology

## 2013-11-04 NOTE — Progress Notes (Signed)
Patient ID: Janet Dixon, female   DOB: [birth year], 28 y.o.   MRN: [id number] No show for appt.! [email or web address]

## 2014-02-10 ENCOUNTER — Encounter (HOSPITAL_COMMUNITY): Payer: Self-pay | Admitting: *Deleted

## 2014-03-14 ENCOUNTER — Encounter (HOSPITAL_COMMUNITY): Payer: Self-pay | Admitting: *Deleted

## 2014-03-14 ENCOUNTER — Emergency Department (HOSPITAL_COMMUNITY)
Admission: EM | Admit: 2014-03-14 | Discharge: 2014-03-15 | Discharge status: Against Medical Advice | Payer: Medicaid Other | Attending: Emergency Medicine | Admitting: Emergency Medicine

## 2014-03-14 ENCOUNTER — Emergency Department (HOSPITAL_COMMUNITY): Payer: Medicaid Other

## 2014-03-14 DIAGNOSIS — R569 Unspecified convulsions: Secondary | ICD-10-CM | POA: Insufficient documentation

## 2014-03-14 DIAGNOSIS — Z8619 Personal history of other infectious and parasitic diseases: Secondary | ICD-10-CM | POA: Insufficient documentation

## 2014-03-14 DIAGNOSIS — F1410 Cocaine abuse, uncomplicated: Secondary | ICD-10-CM | POA: Insufficient documentation

## 2014-03-14 DIAGNOSIS — N390 Urinary tract infection, site not specified: Secondary | ICD-10-CM | POA: Insufficient documentation

## 2014-03-14 DIAGNOSIS — F1210 Cannabis abuse, uncomplicated: Secondary | ICD-10-CM | POA: Insufficient documentation

## 2014-03-14 DIAGNOSIS — R251 Tremor, unspecified: Secondary | ICD-10-CM | POA: Insufficient documentation

## 2014-03-14 DIAGNOSIS — Z720 Tobacco use: Secondary | ICD-10-CM | POA: Insufficient documentation

## 2014-03-14 DIAGNOSIS — R079 Chest pain, unspecified: Secondary | ICD-10-CM

## 2014-03-14 DIAGNOSIS — Z3202 Encounter for pregnancy test, result negative: Secondary | ICD-10-CM | POA: Insufficient documentation

## 2014-03-14 DIAGNOSIS — R0789 Other chest pain: Secondary | ICD-10-CM | POA: Insufficient documentation

## 2014-03-14 HISTORY — DX: Unspecified convulsions: R56.9

## 2014-03-14 MED ORDER — LORAZEPAM 2 MG/ML IJ SOLN
0.5000 mg | Freq: Once | INTRAMUSCULAR | Status: AC
  Administered 2014-03-15: 0.5 mg via INTRAVENOUS

## 2014-03-14 MED ORDER — SODIUM CHLORIDE 0.9 % IV BOLUS (SEPSIS)
1000.0000 mL | Freq: Once | INTRAVENOUS | Status: AC
  Administered 2014-03-15: 1000 mL via INTRAVENOUS

## 2014-03-14 NOTE — ED Provider Notes (Signed)
CSN: [id number]     Arrival date & time 03/14/14  2325 History  This chart was scribed for Janet Essex, MD by Edison Simon, ED Scribe. This patient was seen in room APA14/APA14 and the patient's care was started at 11:35 PM.    Chief Complaint  Patient presents with  . Seizures   The history is provided by the patient and a relative. No language interpreter was used.    HPI Comments: Janet Dixon is a 28 y.o. female with history of seizure disorder who presents to the Emergency Department complaining of seizures today. Per sister, her last seizure was a few minutes ago; she states the patient's eyes rolled back into her head and she had generalizes shaking. Her sister states she does not know when her last seizure was previous to today; she also does not know what happened earlier today because she just got home from work, but states the patient was with a friend most of the day. The patient reports associated chest pain with onset 3 days ago, worse with palpation. She states her pain is better after sleeping and is worse with a medication which she cannot name. She reports associated vision blurriness. She states she is prescribed phenobarbitol for seizures, but she has been out for 2 weeks. Per sister, she does not have a PCP and was prescribed  phenobarbital prescription from a hospital in [place] a few months ago. Her sister states she has not had recent illness. She denies other medical problems. She states the patient smokes cigarettes. The patient denies headache, possible pregnancy, abdominal pain. She states her last period was last week.  Past Medical History  Diagnosis Date  . History of gonorrhea   . History of trichomoniasis   . History of chlamydia   . Seizures    Past Surgical History  Procedure Laterality Date  . Cesarean section     Family History  Problem Relation Age of Onset  . Cancer Other   . Hypertension Other   . CAD Other    History  Substance Use Topics   . Smoking status: Current Every Day Smoker  . Smokeless tobacco: Not on file  . Alcohol Use: No   OB History    Gravida Para Term Preterm AB TAB SAB Ectopic Multiple Living   5 4 4  1  1         Review of Systems   A complete 10 system review of systems was obtained and all systems are negative except as noted in the HPI and PMH.    Allergies  Macrobid; Diphenhydramine hcl; Ibuprofen; and Flexeril  Home Medications   Prior to Admission medications   Medication Sig Start Date End Date Taking? Authorizing Provider  albuterol (PROVENTIL HFA;VENTOLIN HFA) 108 (90 BASE) MCG/ACT inhaler Inhale 2 puffs into the lungs every 6 (six) hours as needed for wheezing or shortness of breath.    Historical Provider, MD   BP 112/79 mmHg  Pulse 80  Temp(Src) 97.6 F (36.4 C) (Rectal)  Resp 15  SpO2 100%  LMP 02/28/2014 Physical Exam  Constitutional: She appears well-developed and well-nourished. No distress.  Tearful, uncooperative  HENT:  Head: Normocephalic and atraumatic.  Mouth/Throat: Oropharynx is clear and moist. No oropharyngeal exudate.  Eyes: Conjunctivae and EOM are normal. Pupils are equal, round, and reactive to light.  Neck: Normal range of motion. Neck supple.  No meningismus.  Cardiovascular: Normal rate, regular rhythm, normal heart sounds and intact distal pulses.   No  murmur heard. Pulmonary/Chest: Effort normal and breath sounds normal. No respiratory distress. She exhibits tenderness.  L chest wall tenderness  Abdominal: Soft. There is no tenderness. There is no rebound and no guarding.  Musculoskeletal: Normal range of motion. She exhibits no edema or tenderness.  Neurological: She is alert. No cranial nerve deficit. She exhibits normal muscle tone. Coordination normal.  Cranial nerves 2-12 intact, 5/5 strength throughout, oriented x2 Shivering Repeats "I need my medicine" uncooperative  Skin: Skin is warm.  Psychiatric: She has a normal mood and affect. Her  behavior is normal.  Nursing note and vitals reviewed.   ED Course  Procedures (including critical care time)  DIAGNOSTIC STUDIES: Oxygen Saturation is 100% on room air, normal by my interpretation.    COORDINATION OF CARE: 11:45 PM Discussed treatment plan with patient at beside, including head CT, heart tracing, and x-ray. The patient agrees with the plan and has no further questions at this time.  1:22 AM Per family member, she has been using phenobarbitol for a few years, but she does not like taking it because it makes her feel bad. He states she has "silent seizures" and states she was very quiet and still but sometimes has shaking.  Labs Review Labs Reviewed  CBC WITH DIFFERENTIAL - Abnormal; Notable for the following:    Lymphs Abs 4.2 (*)    All other components within normal limits  COMPREHENSIVE METABOLIC PANEL - Abnormal; Notable for the following:    Potassium 3.2 (*)    Total Bilirubin 0.2 (*)    Anion gap 18 (*)    All other components within normal limits  URINE RAPID DRUG SCREEN (HOSP PERFORMED) - Abnormal; Notable for the following:    Cocaine POSITIVE (*)    Tetrahydrocannabinol POSITIVE (*)    All other components within normal limits  URINALYSIS, ROUTINE W REFLEX MICROSCOPIC - Abnormal; Notable for the following:    Specific Gravity, Urine <1.005 (*)    Nitrite POSITIVE (*)    Leukocytes, UA SMALL (*)    All other components within normal limits  PHENOBARBITAL LEVEL - Abnormal; Notable for the following:    Phenobarbital <2.4 (*)    All other components within normal limits  SALICYLATE LEVEL - Abnormal; Notable for the following:    Salicylate Lvl <8.2 (*)    All other components within normal limits  URINE MICROSCOPIC-ADD ON - Abnormal; Notable for the following:    Bacteria, UA MANY (*)    All other components within normal limits  BASIC METABOLIC PANEL - Abnormal; Notable for the following:    Potassium 3.6 (*)    All other components within normal  limits  TROPONIN I  ETHANOL  PREGNANCY, URINE  ACETAMINOPHEN LEVEL  CK  TROPONIN I  CBG MONITORING, ED    Imaging Review Dg Chest 2 View  03/15/2014   CLINICAL DATA:  Witnessed seizure, chest pain  EXAM: CHEST  2 VIEW  COMPARISON:  02/11/2010  FINDINGS: Lungs are clear.  No pleural effusion or pneumothorax.  The heart is normal in size.  Visualized osseous structures are within normal limits.  IMPRESSION: No evidence of acute cardiopulmonary disease.   Electronically Signed   By: Julian Hy M.D.   On: 03/15/2014 02:54   Ct Head Wo Contrast  03/15/2014   CLINICAL DATA:  Initial evaluation for seizure.  EXAM: CT HEAD WITHOUT CONTRAST  TECHNIQUE: Contiguous axial images were obtained from the base of the skull through the vertex without intravenous contrast.  COMPARISON:  None.  FINDINGS: There is no acute intracranial hemorrhage or infarct. No mass lesion or midline shift. Gray-white matter differentiation is well maintained. Ventricles are normal in size without evidence of hydrocephalus. CSF containing spaces are within normal limits. No extra-axial fluid collection.  The calvarium is intact.  Orbital soft tissues are within normal limits.  The paranasal sinuses and mastoid air cells are well pneumatized and free of fluid.  Scalp soft tissues are unremarkable.  IMPRESSION: Normal head CT with no acute intracranial process.   Electronically Signed   By: Jeannine Boga M.D.   On: 03/15/2014 02:51     EKG Interpretation None      MDM   Final diagnoses:  Seizure  Chest pain  Cocaine abuse   Patient from home with seizure activity witnessed by sister. Apparently has history of seizure disorder and takes phenobarbital but she is noncompliant. Patient is altered and complaining of chest pain that is reproducible.  EKG is nonischemic. Urinalysis is positive for infection. Pregnancy test negative. Phenobarbital level not detectable. Drug screen positive for marijuana and  cocaine.  CT negative. Patient more alert and cooperative.  She is oriented x3, following commands, moving all extremities. Her boyfriend states patient has "silent seizures" and doesn't like taking her phenobarbital because it "makes her worse".  He is unable to tell me who prescribes her phenobarbital or what dose. He feels her chest pain was so bad that it caused her to have a seizure.  Patient admits to using cocaine yesterday morning around 9 AM. She still has ongoing chest pain. She states her chest has been hurting for 2 days previous to using the cocaine and it has been constant. Her chest pain got worse after using cocaine yesterday. Her EKG shows no acute ischemia. Her troponins are negative 2.  Patient is insisting that she needs to leave and go home. She continues to complain of chest pain. It is reproducible. She is unable to give any details about her possible seizure disorder but admits she does not take her phenobarbital. She understands that she could be at risk for having cocaine-related myocardial infarction or arrhythmia. She could also be at risk for more seizures given her noncompliance.  Planned to discuss with Neurology but patient insistent on leaving. She will leave [place] and appears to have capacity to make that decision. She is alert and oriented 3 and ambulatory. Keflex called to pharmacy for UTI.    Date: 03/15/2014  Rate: 85  Rhythm: normal sinus rhythm  QRS Axis: normal  Intervals: normal  ST/T Wave abnormalities: nonspecific ST/T changes  Conduction Disutrbances:none  Narrative Interpretation: artifact  Old EKG Reviewed: none available   I personally performed the services described in this documentation, which was scribed in my presence. The recorded information has been reviewed and is accurate.     Janet Essex, MD 03/15/14 [phone]

## 2014-03-14 NOTE — ED Notes (Signed)
Sister states that pts eyes rolled back in her head. Pt c/o chest pain and seems confused.

## 2014-03-15 ENCOUNTER — Other Ambulatory Visit: Payer: Self-pay

## 2014-03-15 LAB — COMPREHENSIVE METABOLIC PANEL
ALT: 13 U/L (ref 0–35)
AST: 18 U/L (ref 0–37)
Albumin: 4.3 g/dL (ref 3.5–5.2)
Alkaline Phosphatase: 85 U/L (ref 39–117)
Anion gap: 18 — ABNORMAL HIGH (ref 5–15)
BUN: 9 mg/dL (ref 6–23)
CALCIUM: 10.1 mg/dL (ref 8.4–10.5)
CO2: 19 mEq/L (ref 19–32)
CREATININE: 0.83 mg/dL (ref 0.50–1.10)
Chloride: 105 mEq/L (ref 96–112)
GFR calc non Af Amer: >90 mL/min (ref >90)
Glucose, Bld: 88 mg/dL (ref 70–99)
Potassium: 3.2 mEq/L — ABNORMAL LOW (ref 3.7–5.3)
SODIUM: 142 meq/L (ref 137–147)
TOTAL PROTEIN: 8.2 g/dL (ref 6.0–8.3)
Total Bilirubin: 0.2 mg/dL — ABNORMAL LOW (ref 0.3–1.2)

## 2014-03-15 LAB — URINE MICROSCOPIC-ADD ON

## 2014-03-15 LAB — BASIC METABOLIC PANEL
Anion gap: 12 (ref 5–15)
BUN: 7 mg/dL (ref 6–23)
CHLORIDE: 109 meq/L (ref 96–112)
CO2: 23 meq/L (ref 19–32)
Calcium: 8.6 mg/dL (ref 8.4–10.5)
Creatinine, Ser: 0.79 mg/dL (ref 0.50–1.10)
GFR calc Af Amer: >90 mL/min (ref >90)
GFR calc non Af Amer: >90 mL/min (ref >90)
GLUCOSE: 93 mg/dL (ref 70–99)
POTASSIUM: 3.6 meq/L — AB (ref 3.7–5.3)
Sodium: 144 mEq/L (ref 137–147)

## 2014-03-15 LAB — RAPID URINE DRUG SCREEN, HOSP PERFORMED
AMPHETAMINES: NOT DETECTED
Barbiturates: NOT DETECTED
Benzodiazepines: NOT DETECTED
COCAINE: POSITIVE — AB
Opiates: NOT DETECTED
Tetrahydrocannabinol: POSITIVE — AB

## 2014-03-15 LAB — CBC WITH DIFFERENTIAL/PLATELET
Basophils Absolute: 0 10*3/uL (ref 0.0–0.1)
Basophils Relative: 0 % (ref 0–1)
EOS ABS: 0.1 10*3/uL (ref 0.0–0.7)
Eosinophils Relative: 1 % (ref 0–5)
HCT: 40.4 % (ref 36.0–46.0)
Hemoglobin: 14 g/dL (ref 12.0–15.0)
Lymphocytes Relative: 40 % (ref 12–46)
Lymphs Abs: 4.2 10*3/uL — ABNORMAL HIGH (ref 0.7–4.0)
MCH: 32.6 pg (ref 26.0–34.0)
MCHC: 34.7 g/dL (ref 30.0–36.0)
MCV: 94.2 fL (ref 78.0–100.0)
Monocytes Absolute: 0.7 10*3/uL (ref 0.1–1.0)
Monocytes Relative: 6 % (ref 3–12)
Neutro Abs: 5.3 10*3/uL (ref 1.7–7.7)
Neutrophils Relative %: 53 % (ref 43–77)
PLATELETS: 385 10*3/uL (ref 150–400)
RBC: 4.29 MIL/uL (ref 3.87–5.11)
RDW: 13.4 % (ref 11.5–15.5)
WBC: 10.3 10*3/uL (ref 4.0–10.5)

## 2014-03-15 LAB — CK: Total CK: 105 U/L (ref 7–177)

## 2014-03-15 LAB — TROPONIN I: Troponin I: <0.3 ng/mL (ref <0.30)

## 2014-03-15 LAB — PHENOBARBITAL LEVEL: Phenobarbital: <2.4 ug/mL — ABNORMAL LOW (ref 15.0–40.0)

## 2014-03-15 LAB — URINALYSIS, ROUTINE W REFLEX MICROSCOPIC
Bilirubin Urine: NEGATIVE
Glucose, UA: NEGATIVE mg/dL
Hgb urine dipstick: NEGATIVE
Ketones, ur: NEGATIVE mg/dL
Nitrite: POSITIVE — AB
PH: 6.5 (ref 5.0–8.0)
Protein, ur: NEGATIVE mg/dL
Specific Gravity, Urine: <1.005 — ABNORMAL LOW (ref 1.005–1.030)
Urobilinogen, UA: 0.2 mg/dL (ref 0.0–1.0)

## 2014-03-15 LAB — PREGNANCY, URINE: Preg Test, Ur: NEGATIVE

## 2014-03-15 LAB — ETHANOL: Alcohol, Ethyl (B): <11 mg/dL (ref 0–11)

## 2014-03-15 LAB — SALICYLATE LEVEL

## 2014-03-15 LAB — ACETAMINOPHEN LEVEL

## 2014-03-15 MED ORDER — KETOROLAC TROMETHAMINE 30 MG/ML IJ SOLN
30.0000 mg | Freq: Once | INTRAMUSCULAR | Status: AC
  Administered 2014-03-15: 30 mg via INTRAVENOUS

## 2014-03-15 MED ORDER — CEFTRIAXONE SODIUM 1 G IJ SOLR
1.0000 g | Freq: Once | INTRAMUSCULAR | Status: AC
  Administered 2014-03-15: 1 g via INTRAVENOUS

## 2014-03-15 NOTE — ED Notes (Addendum)
Pts boyfriend stating that pt needs to leave because someone needs the car to go to work. EDP notified and will go into see pt. Pt requesting something to eat.

## 2014-03-15 NOTE — ED Notes (Signed)
Pt upset because she thinks that the DR thinks she is a drug addict.

## 2014-03-15 NOTE — Discharge Instructions (Signed)
Chest Pain (Nonspecific) You are leaving against medical advice.  You could be at risk for having heart attack or arrhythmia.  Return to the ED if you wish to be reevaluated. It is often hard to give a specific diagnosis for the cause of chest pain. There is always a chance that your pain could be related to something serious, such as a heart attack or a blood clot in the lungs. You need to follow up with your health care provider for further evaluation. CAUSES   Heartburn.  Pneumonia or bronchitis.  Anxiety or stress.  Inflammation around your heart (pericarditis) or lung (pleuritis or pleurisy).  A blood clot in the lung.  A collapsed lung (pneumothorax). It can develop suddenly on its own (spontaneous pneumothorax) or from trauma to the chest.  Shingles infection (herpes zoster virus). The chest wall is composed of bones, muscles, and cartilage. Any of these can be the source of the pain.  The bones can be bruised by injury.  The muscles or cartilage can be strained by coughing or overwork.  The cartilage can be affected by inflammation and become sore (costochondritis). DIAGNOSIS  Lab tests or other studies may be needed to find the cause of your pain. Your health care provider may have you take a test called an ambulatory electrocardiogram (ECG). An ECG records your heartbeat patterns over a 24-hour period. You may also have other tests, such as:  Transthoracic echocardiogram (TTE). During echocardiography, sound waves are used to evaluate how blood flows through your heart.  Transesophageal echocardiogram (TEE).  Cardiac monitoring. This allows your health care provider to monitor your heart rate and rhythm in real time.  Holter monitor. This is a portable device that records your heartbeat and can help diagnose heart arrhythmias. It allows your health care provider to track your heart activity for several days, if needed.  Stress tests by exercise or by giving medicine that  makes the heart beat faster. TREATMENT   Treatment depends on what may be causing your chest pain. Treatment may include:  Acid blockers for heartburn.  Anti-inflammatory medicine.  Pain medicine for inflammatory conditions.  Antibiotics if an infection is present.  You may be advised to change lifestyle habits. This includes stopping smoking and avoiding alcohol, caffeine, and chocolate.  You may be advised to keep your head raised (elevated) when sleeping. This reduces the chance of acid going backward from your stomach into your esophagus. Most of the time, nonspecific chest pain will improve within 2-3 days with rest and mild pain medicine.  HOME CARE INSTRUCTIONS   If antibiotics were prescribed, take them as directed. Finish them even if you start to feel better.  For the next few days, avoid physical activities that bring on chest pain. Continue physical activities as directed.  Do not use any tobacco products, including cigarettes, chewing tobacco, or electronic cigarettes.  Avoid drinking alcohol.  Only take medicine as directed by your health care provider.  Follow your health care provider's suggestions for further testing if your chest pain does not go away.  Keep any follow-up appointments you made. If you do not go to an appointment, you could develop lasting (chronic) problems with pain. If there is any problem keeping an appointment, call to reschedule. SEEK MEDICAL CARE IF:   Your chest pain does not go away, even after treatment.  You have a rash with blisters on your chest.  You have a fever. SEEK IMMEDIATE MEDICAL CARE IF:   You have increased  chest pain or pain that spreads to your arm, neck, jaw, back, or abdomen.  You have shortness of breath.  You have an increasing cough, or you cough up blood.  You have severe back or abdominal pain.  You feel nauseous or vomit.  You have severe weakness.  You faint.  You have chills. This is an  emergency. Do not wait to see if the pain will go away. Get medical help at once. Call your local emergency services (911 in [place] Do not drive yourself to the hospital. MAKE SURE YOU:   Understand these instructions.  Will watch your condition.  Will get help right away if you are not doing well or get worse. Document Released: 01/05/2005 Document Revised: 04/02/2013 Document Reviewed: 11/01/2007 [facility] Patient Information 2015 [place], [place]. This information is not intended to replace advice given to you by your health care provider. Make sure you discuss any questions you have with your health care provider.

## 2014-03-17 LAB — CBG MONITORING, ED: Glucose-Capillary: 99 mg/dL (ref 70–99)

## 2014-04-23 ENCOUNTER — Encounter (HOSPITAL_COMMUNITY): Payer: Self-pay | Admitting: *Deleted

## 2014-04-23 ENCOUNTER — Emergency Department (HOSPITAL_COMMUNITY): Payer: Medicaid Other

## 2014-04-23 ENCOUNTER — Emergency Department (HOSPITAL_COMMUNITY)
Admission: EM | Admit: 2014-04-23 | Discharge: 2014-04-23 | Disposition: A | Discharge status: Home / Self Care | Payer: Self-pay | Attending: Emergency Medicine | Admitting: Emergency Medicine

## 2014-04-23 DIAGNOSIS — R197 Diarrhea, unspecified: Secondary | ICD-10-CM

## 2014-04-23 DIAGNOSIS — Z79899 Other long term (current) drug therapy: Secondary | ICD-10-CM | POA: Insufficient documentation

## 2014-04-23 DIAGNOSIS — O26899 Other specified pregnancy related conditions, unspecified trimester: Secondary | ICD-10-CM

## 2014-04-23 DIAGNOSIS — N390 Urinary tract infection, site not specified: Secondary | ICD-10-CM

## 2014-04-23 DIAGNOSIS — Z8619 Personal history of other infectious and parasitic diseases: Secondary | ICD-10-CM | POA: Insufficient documentation

## 2014-04-23 DIAGNOSIS — R109 Unspecified abdominal pain: Secondary | ICD-10-CM

## 2014-04-23 DIAGNOSIS — Z3A01 Less than 8 weeks gestation of pregnancy: Secondary | ICD-10-CM | POA: Insufficient documentation

## 2014-04-23 DIAGNOSIS — F17210 Nicotine dependence, cigarettes, uncomplicated: Secondary | ICD-10-CM | POA: Insufficient documentation

## 2014-04-23 DIAGNOSIS — O99331 Smoking (tobacco) complicating pregnancy, first trimester: Secondary | ICD-10-CM | POA: Insufficient documentation

## 2014-04-23 DIAGNOSIS — R1110 Vomiting, unspecified: Secondary | ICD-10-CM

## 2014-04-23 DIAGNOSIS — O2341 Unspecified infection of urinary tract in pregnancy, first trimester: Secondary | ICD-10-CM | POA: Insufficient documentation

## 2014-04-23 LAB — I-STAT CHEM 8, ED
BUN: 6 mg/dL (ref 6–23)
CALCIUM ION: 1.27 mmol/L — AB (ref 1.12–1.23)
CREATININE: 0.6 mg/dL (ref 0.50–1.10)
Chloride: 102 mEq/L (ref 96–112)
GLUCOSE: 80 mg/dL (ref 70–99)
HCT: 45 % (ref 36.0–46.0)
Hemoglobin: 15.3 g/dL — ABNORMAL HIGH (ref 12.0–15.0)
Potassium: 3.5 mmol/L (ref 3.5–5.1)
Sodium: 139 mmol/L (ref 135–145)
TCO2: 18 mmol/L (ref 0–100)

## 2014-04-23 LAB — URINALYSIS, ROUTINE W REFLEX MICROSCOPIC
Bilirubin Urine: NEGATIVE
Glucose, UA: NEGATIVE mg/dL
Ketones, ur: 40 mg/dL — AB
NITRITE: NEGATIVE
Protein, ur: 30 mg/dL — AB
Urobilinogen, UA: 0.2 mg/dL (ref 0.0–1.0)
pH: 6 (ref 5.0–8.0)

## 2014-04-23 LAB — URINE MICROSCOPIC-ADD ON

## 2014-04-23 LAB — I-STAT BETA HCG BLOOD, ED (MC, WL, AP ONLY)

## 2014-04-23 LAB — WET PREP, GENITAL
TRICH WET PREP: NONE SEEN
WBC, Wet Prep HPF POC: NONE SEEN
Yeast Wet Prep HPF POC: NONE SEEN

## 2014-04-23 MED ORDER — ONDANSETRON 8 MG PO TBDP
8.0000 mg | ORAL_TABLET | Freq: Once | ORAL | Status: AC
  Administered 2014-04-23: 8 mg via ORAL
  Filled 2014-04-23: qty 1

## 2014-04-23 MED ORDER — AZITHROMYCIN 250 MG PO TABS
1000.0000 mg | ORAL_TABLET | Freq: Once | ORAL | Status: AC
  Administered 2014-04-23: 1000 mg via ORAL
  Filled 2014-04-23: qty 4

## 2014-04-23 MED ORDER — CEFTRIAXONE SODIUM 250 MG IJ SOLR
250.0000 mg | Freq: Once | INTRAMUSCULAR | Status: AC
  Administered 2014-04-23: 250 mg via INTRAMUSCULAR
  Filled 2014-04-23: qty 250

## 2014-04-23 MED ORDER — LIDOCAINE HCL (PF) 1 % IJ SOLN
INTRAMUSCULAR | Status: AC
  Filled 2014-04-23: qty 5

## 2014-04-23 MED ORDER — ONDANSETRON HCL 4 MG/2ML IJ SOLN: 4.0000 mg | Freq: Once | INTRAMUSCULAR | Status: DC

## 2014-04-23 MED ORDER — ONDANSETRON HCL 8 MG PO TABS: 8.0000 mg | ORAL_TABLET | Freq: Three times a day (TID) | ORAL | Status: DC | PRN

## 2014-04-23 MED ORDER — CEPHALEXIN 500 MG PO CAPS: 500.0000 mg | ORAL_CAPSULE | Freq: Four times a day (QID) | ORAL | Status: DC

## 2014-04-23 MED ORDER — SODIUM CHLORIDE 0.9 % IV BOLUS (SEPSIS)
1000.0000 mL | Freq: Once | INTRAVENOUS | Status: AC
  Administered 2014-04-23: 1000 mL via INTRAVENOUS

## 2014-04-23 NOTE — Discharge Instructions (Signed)

## 2014-04-23 NOTE — ED Notes (Signed)
IV d/ced at this time to right ac.

## 2014-04-23 NOTE — ED Notes (Signed)
Pt states she is pregnant but unsure how far along. LMP 03/04/2014. Pt states vomiting and diarrhea x 4 days.

## 2014-04-23 NOTE — ED Notes (Signed)
[name] made aware of orders.

## 2014-04-23 NOTE — ED Provider Notes (Signed)
CSN: [id number]     Arrival date & time 04/23/14  1130 History  This chart was scribed for Sharyon Cable, MD by Edison Simon, ED Scribe. This patient was seen in room APA19/APA19 and the patient's care was started at 12:26 PM.    Chief Complaint  Patient presents with  . Emesis During Pregnancy   Patient is a 29 y.o. female presenting with vomiting. The history is provided by the patient. No language interpreter was used.  Emesis Severity:  Moderate Duration:  5 days Timing:  Intermittent Quality:  Unable to specify Feeding tolerance: neither fluids nor solids. Progression:  Worsening Chronicity:  New Recent urination:  Normal Context: not post-tussive and not self-induced   Relieved by:  Nothing Worsened by:  Nothing tried Ineffective treatments:  None tried Associated symptoms: abdominal pain, chills and diarrhea   Associated symptoms: no headaches   Abdominal pain:    Location:  LLQ and RLQ   Quality:  Dull   Severity:  Mild   Onset quality:  Sudden   Duration:  2 days   Timing:  Constant Diarrhea:    Number of occurrences:  2-3   Duration:  5 days Risk factors: pregnant now     HPI Comments: Janet Dixon is a 29 y.o. pregnant female who presents to the Emergency Department complaining of vomiting with onset 5 days ago. She reports associated abdominal pain with onset yesterday, decreased appetite, diarrhea with 2-3 counts per day, and lightheadedness when standing and talking. She states she is unable to tolerate solids or fluids, including ice chips and clear fluids. She states that the smell of grease makes her abdomen hurt. She states abdominal pain is not worse with diarrhea. She states she has had 2 positive pregnancy tests but does not know how far along she is. She states she has 5 children and has never had similar symptoms previously. She denies fever, headache, cough, vaginal bleeding, or blood in stool.  Past Medical History  Diagnosis Date  . History of  gonorrhea   . History of trichomoniasis   . History of chlamydia   . Seizures    Past Surgical History  Procedure Laterality Date  . Cesarean section     Family History  Problem Relation Age of Onset  . Cancer Other   . Hypertension Other   . CAD Other    History  Substance Use Topics  . Smoking status: Current Every Day Smoker  . Smokeless tobacco: Not on file  . Alcohol Use: No   OB History    Gravida Para Term Preterm AB TAB SAB Ectopic Multiple Living   5 4 4  1  1         Review of Systems  Constitutional: Positive for chills. Negative for fever.  Respiratory: Negative for cough.   Gastrointestinal: Positive for nausea, vomiting, abdominal pain and diarrhea. Negative for blood in stool.  Genitourinary: Negative for vaginal bleeding.  Neurological: Positive for light-headedness. Negative for headaches.  All other systems reviewed and are negative.     Allergies  Macrobid; Diphenhydramine hcl; Ibuprofen; and Flexeril  Home Medications   Prior to Admission medications   Medication Sig Start Date End Date Taking? Authorizing Provider  albuterol (PROVENTIL HFA;VENTOLIN HFA) 108 (90 BASE) MCG/ACT inhaler Inhale 2 puffs into the lungs every 6 (six) hours as needed for wheezing or shortness of breath.   Yes Historical Provider, MD   BP 130/71 mmHg  Pulse 62  Temp(Src) 99.1 F (37.3  C) (Oral)  Resp 16  Ht 5' 1"  (1.549 m)  Wt 125 lb (56.7 kg)  BMI 23.63 kg/m2  SpO2 98%  LMP 03/04/2014 Physical Exam  Nursing note and vitals reviewed.   CONSTITUTIONAL: Well developed/well nourished HEAD: Normocephalic/atraumatic EYES: EOMI/PERRL ENMT: Mucous membranes moist NECK: supple no meningeal signs SPINE/BACK:entire spine nontender CV: S1/S2 noted, no murmurs/rubs/gallops noted LUNGS: Lungs are clear to auscultation bilaterally, no apparent distress ABDOMEN: soft, mild lower abdominal tenderness, no rebound or guarding, bowel sounds noted throughout abdomen GU:no  cva tenderness. +CMT noted.  No signfiicant discharge and no vaginal bleeding.  Chaperone persent NEURO: Pt is awake/alert/appropriate, moves all extremitiesx4.  No facial droop.   EXTREMITIES: pulses normal/equal, full ROM SKIN: warm, color normal PSYCH: no abnormalities of mood noted, alert and oriented to situation  ED Course  Procedures   EMERGENCY DEPARTMENT [name] PREGNANCY "Study: Limited Ultrasound of the Pelvis"  INDICATIONS:Pregnancy(required) and Pelvic pain Multiple views of the uterus and pelvic cavity are obtained with a multi-frequency probe.  APPROACH:Transabdominal   PERFORMED BY: Myself  IMAGES ARCHIVED?: Yes  LIMITATIONS: Body habitus and Decompressed bladder  PREGNANCY FREE FLUID: None  PREGNANCY UTERUS FINDINGS:Gestational sac noted   PREGNANCY FINDINGS: No yolk sac noted  INTERPRETATION: no yolk sac   COMMENT(Estimate of Gestational Age):  Will proceed with formal US as no yolk sac identified and significant limitations noted during exam.    DIAGNOSTIC STUDIES: Oxygen Saturation is 98% on room air, normal by my interpretation.    COORDINATION OF CARE: 12:33 PM Discussed treatment plan with patient at beside, the patient agrees with the plan and has no further questions at this time. Pt reports concern for exposure to STD with recent sexual partners - will treat with rocephin/azithromycin  3:09 PM Pt advised to f/u with OBGYN Will treat for UTI Discussed need to quit smoking and avoid all etoh/drugs Pt is appropriate for d/c home Labs Review Labs Reviewed  URINALYSIS, ROUTINE W REFLEX MICROSCOPIC - Abnormal; Notable for the following:    Specific Gravity, Urine >1.030 (*)    Hgb urine dipstick SMALL (*)    Ketones, ur 40 (*)    Protein, ur 30 (*)    Leukocytes, UA MODERATE (*)    All other components within normal limits  URINE MICROSCOPIC-ADD ON - Abnormal; Notable for the following:    Squamous Epithelial / LPF FEW (*)    Bacteria, UA  MANY (*)    All other components within normal limits  I-STAT CHEM 8, ED - Abnormal; Notable for the following:    Calcium, Ion 1.27 (*)    Hemoglobin 15.3 (*)    All other components within normal limits  I-STAT BETA HCG BLOOD, ED ([facility], [facility], [facility] ONLY) - Abnormal; Notable for the following:    I-stat hCG, quantitative >2000.0 (*)    All other components within normal limits  WET PREP, GENITAL  RPR  HIV ANTIBODY (ROUTINE TESTING)  GC/CHLAMYDIA PROBE AMP ([place])    Imaging Review US Ob Comp Less 14 Wks  04/23/2014   CLINICAL DATA:  Pelvic pain with nausea and vomiting for 5 days. Quantitative beta HCG level is greater than 2000.  EXAM: OBSTETRIC <14 WK [name] AND TRANSVAGINAL OB US  TECHNIQUE: Both transabdominal and transvaginal ultrasound examinations were performed for complete evaluation of the gestation as well as the maternal uterus, adnexal regions, and pelvic cul-de-sac. Transvaginal technique was performed to assess early pregnancy.  COMPARISON:  None.  FINDINGS: Intrauterine gestational sac: Visualized/normal in  shape.  Yolk sac:  Present  Embryo:  Present  Cardiac Activity: Present  Heart Rate:  169 bpm  CRL:   8.9  mm   7 w 0 d                  [name] EDC: 12/10/2014  Maternal uterus/adnexae:  Small subchorionic hemorrhage.  Normal right ovary.  Normal left ovary.  Trace free pelvic fluid.  IMPRESSION: Single living intrauterine embryo estimated at 7 weeks and 0 days gestation.  Small subchorionic hemorrhage.  Normal ovaries.  Trace free pelvic fluid.   Electronically Signed   By: Kalman Jewels M.D.   On: 04/23/2014 14:55   US Ob Transvaginal  04/23/2014   CLINICAL DATA:  Pelvic pain with nausea and vomiting for 5 days. Quantitative beta HCG level is greater than 2000.  EXAM: OBSTETRIC <14 WK [name] AND TRANSVAGINAL OB US  TECHNIQUE: Both transabdominal and transvaginal ultrasound examinations were performed for complete evaluation of the gestation as well as the maternal uterus, adnexal  regions, and pelvic cul-de-sac. Transvaginal technique was performed to assess early pregnancy.  COMPARISON:  None.  FINDINGS: Intrauterine gestational sac: Visualized/normal in shape.  Yolk sac:  Present  Embryo:  Present  Cardiac Activity: Present  Heart Rate:  169 bpm  CRL:   8.9  mm   7 w 0 d                  [name] EDC: 12/10/2014  Maternal uterus/adnexae:  Small subchorionic hemorrhage.  Normal right ovary.  Normal left ovary.  Trace free pelvic fluid.  IMPRESSION: Single living intrauterine embryo estimated at 7 weeks and 0 days gestation.  Small subchorionic hemorrhage.  Normal ovaries.  Trace free pelvic fluid.   Electronically Signed   By: Kalman Jewels M.D.   On: 04/23/2014 14:55      Medications  lidocaine (PF) (XYLOCAINE) 1 % injection (not administered)  ondansetron (ZOFRAN-ODT) disintegrating tablet 8 mg (8 mg Oral Given 04/23/14 1154)  sodium chloride 0.9 % bolus 1,000 mL (1,000 mLs Intravenous New Bag/Given 04/23/14 1245)  cefTRIAXone (ROCEPHIN) injection 250 mg (250 mg Intramuscular Given 04/23/14 1341)  azithromycin (ZITHROMAX) tablet 1,000 mg (1,000 mg Oral Given 04/23/14 1341)    MDM   Final diagnoses:  Abdominal pain affecting pregnancy  UTI (lower urinary tract infection)  Vomiting and diarrhea    Nursing notes including past medical history and social history reviewed and considered in documentation Labs/vital reviewed myself and considered during evaluation    I personally performed the services described in this documentation, which was scribed in my presence. The recorded information has been reviewed and is accurate.      Sharyon Cable, MD 04/23/14 [phone]

## 2014-04-23 NOTE — ED Notes (Signed)
MD at bedside. 

## 2014-04-24 LAB — HIV ANTIBODY (ROUTINE TESTING W REFLEX): HIV 1/HIV 2 AB: NONREACTIVE

## 2014-04-24 LAB — GC/CHLAMYDIA PROBE AMP (~~LOC~~) NOT AT ARMC
CHLAMYDIA, DNA PROBE: NEGATIVE
Neisseria Gonorrhea: POSITIVE — AB

## 2014-04-24 LAB — RPR: RPR: NONREACTIVE

## 2014-04-25 ENCOUNTER — Encounter: Payer: Self-pay | Admitting: Obstetrics and Gynecology

## 2014-04-25 ENCOUNTER — Ambulatory Visit (INDEPENDENT_AMBULATORY_CARE_PROVIDER_SITE_OTHER): Payer: Medicaid Other | Admitting: Obstetrics and Gynecology

## 2014-04-25 DIAGNOSIS — Z331 Pregnant state, incidental: Secondary | ICD-10-CM

## 2014-04-25 DIAGNOSIS — Z1389 Encounter for screening for other disorder: Secondary | ICD-10-CM | POA: Diagnosis not present

## 2014-04-25 DIAGNOSIS — O219 Vomiting of pregnancy, unspecified: Secondary | ICD-10-CM

## 2014-04-25 MED ORDER — ONDANSETRON HCL 8 MG PO TABS: 8.0000 mg | ORAL_TABLET | Freq: Three times a day (TID) | ORAL | Status: DC | PRN

## 2014-04-25 NOTE — Progress Notes (Signed)
Patient ID: Janet Dixon, female   DOB: Nov 18, 1985, 29 y.o.   MRN: [id number] Unknown Estimated Date of Delivery: None noted.  Blood pressure 110/76, height 5' 1"  (1.549 m), weight 125 lb (56.7 kg), last menstrual period 03/04/2014.   refer to the ob flow sheet for FH and FHR, also BP, Wt, Urine results: negative  Patient reports +good fetal movement, denies any bleeding and no rupture of membranes symptoms or regular contractions. Patient complaints: The patient would like Zofran to treat persistent nausea. Rx for 8 mg tabs written.  Questions were answered. Assessment: Unknown, [id number]  Plan:  Continued routine obstetrical care F/u in 2 weeks   This chart was scribed for Jonnie Kind, MD by Donato Schultz, ED Scribe. This patient was seen in Room 1 and the patient's care was started at 12:49 PM.

## 2014-04-25 NOTE — Progress Notes (Signed)
Patient ID: Janet Dixon, female   DOB: Aug 23, 1985, 29 y.o.   MRN: [id number] Pt here today for nausea and vomiting. Pt states that she can't keep anything down and she tries to eat but can't. Pt states that she was given the dissolving tablets but they didn't help the tablets help her more.

## 2014-04-28 ENCOUNTER — Telehealth: Payer: Self-pay | Admitting: Obstetrics and Gynecology

## 2014-04-28 ENCOUNTER — Telehealth: Payer: Self-pay | Admitting: *Deleted

## 2014-04-28 MED ORDER — ONDANSETRON HCL 8 MG PO TABS: 8.0000 mg | ORAL_TABLET | Freq: Three times a day (TID) | ORAL | Status: DC | PRN

## 2014-04-28 NOTE — Telephone Encounter (Signed)
Pt stats that she has been vomiting today and last time she threw up noticed a little bit of blood at the end.  I informed her that it is not uncommon to have some blood if been getting sick a lot due to the irritation.  I informed her that the Zofran has been resent to the pharmacy and she states that it helps her a tremendous amount.  Also advised pt to push fluids and try some lemon in her water, advised if symptoms worsen to notify us or go to [facility].  Pt verbalized understanding.

## 2014-04-28 NOTE — Telephone Encounter (Signed)
Pt stated that she lost her Rx for Zofran. The pt stated that she had lost her paper Rx and needed a new one. Her Rx was resent to [facility].

## 2014-05-01 ENCOUNTER — Encounter: Payer: Self-pay | Admitting: Advanced Practice Midwife

## 2014-05-01 DIAGNOSIS — G40909 Epilepsy, unspecified, not intractable, without status epilepticus: Secondary | ICD-10-CM | POA: Insufficient documentation

## 2014-05-07 ENCOUNTER — Telehealth (HOSPITAL_BASED_OUTPATIENT_CLINIC_OR_DEPARTMENT_OTHER): Payer: Self-pay | Admitting: Emergency Medicine

## 2014-05-07 NOTE — Telephone Encounter (Signed)
Positive Gonorrhea Treated with Rocephin and Zithromax per protocol MD [facility] faxed  05/07/14 @ 1328 patient notified: ID verified. patient notified of positive Gonorrhea and that treatment was given while in ED with Rocephin and Zithromax. STD instructions provided, patient verbalized understanding.

## 2014-05-08 ENCOUNTER — Ambulatory Visit: Payer: Self-pay | Admitting: Advanced Practice Midwife

## 2014-05-09 ENCOUNTER — Other Ambulatory Visit: Payer: Self-pay | Admitting: Obstetrics & Gynecology

## 2014-05-09 DIAGNOSIS — O3680X0 Pregnancy with inconclusive fetal viability, not applicable or unspecified: Secondary | ICD-10-CM

## 2014-05-12 ENCOUNTER — Ambulatory Visit (INDEPENDENT_AMBULATORY_CARE_PROVIDER_SITE_OTHER): Payer: Medicaid Other

## 2014-05-12 ENCOUNTER — Other Ambulatory Visit: Payer: Self-pay | Admitting: Obstetrics & Gynecology

## 2014-05-12 DIAGNOSIS — O3680X0 Pregnancy with inconclusive fetal viability, not applicable or unspecified: Secondary | ICD-10-CM | POA: Diagnosis not present

## 2014-05-12 DIAGNOSIS — O99321 Drug use complicating pregnancy, first trimester: Secondary | ICD-10-CM

## 2014-05-12 DIAGNOSIS — F1910 Other psychoactive substance abuse, uncomplicated: Secondary | ICD-10-CM

## 2014-05-12 NOTE — Progress Notes (Signed)
U/S(9+6wks)-single IUP with +FCA noted, FHR-169 bpm, CRL c/w LMP dates, cx appears closed, bilateral adnexa appears WNL

## 2014-05-13 ENCOUNTER — Telehealth: Payer: Self-pay | Admitting: Advanced Practice Midwife

## 2014-05-13 ENCOUNTER — Other Ambulatory Visit: Payer: Self-pay | Admitting: *Deleted

## 2014-05-15 ENCOUNTER — Telehealth: Payer: Self-pay | Admitting: *Deleted

## 2014-05-15 MED ORDER — ONDANSETRON HCL 8 MG PO TABS: 8.0000 mg | ORAL_TABLET | Freq: Three times a day (TID) | ORAL | Status: DC | PRN

## 2014-05-15 NOTE — Telephone Encounter (Signed)
Rx sent to pt's pharmacy per verbal order from Dr. Glo Herring. Pt aware that Rx has been refilled.

## 2014-05-20 ENCOUNTER — Encounter: Payer: Self-pay | Admitting: Women's Health

## 2014-05-26 ENCOUNTER — Encounter: Payer: Self-pay | Admitting: Adult Health

## 2014-06-30 ENCOUNTER — Telehealth: Payer: Self-pay | Admitting: *Deleted

## 2014-06-30 ENCOUNTER — Other Ambulatory Visit: Payer: Self-pay | Admitting: Obstetrics and Gynecology

## 2014-06-30 MED ORDER — ONDANSETRON HCL 8 MG PO TABS: 8.0000 mg | ORAL_TABLET | Freq: Three times a day (TID) | ORAL | Status: DC | PRN

## 2014-06-30 NOTE — Telephone Encounter (Signed)
zofran refilled.

## 2014-06-30 NOTE — Telephone Encounter (Signed)
refil ondansetron x 3

## 2014-07-22 ENCOUNTER — Encounter: Payer: Self-pay | Admitting: Advanced Practice Midwife

## 2014-07-22 ENCOUNTER — Other Ambulatory Visit (HOSPITAL_COMMUNITY)
Admission: RE | Admit: 2014-07-22 | Discharge: 2014-07-22 | Disposition: A | Discharge status: Home / Self Care | Payer: Medicaid Other | Source: Ambulatory Visit | Attending: Advanced Practice Midwife | Admitting: Advanced Practice Midwife

## 2014-07-22 ENCOUNTER — Ambulatory Visit (INDEPENDENT_AMBULATORY_CARE_PROVIDER_SITE_OTHER): Payer: Medicaid Other | Admitting: Advanced Practice Midwife

## 2014-07-22 VITALS — BP 110/58 | HR 84 | Wt 140.8 lb

## 2014-07-22 DIAGNOSIS — Z3492 Encounter for supervision of normal pregnancy, unspecified, second trimester: Secondary | ICD-10-CM

## 2014-07-22 DIAGNOSIS — Z369 Encounter for antenatal screening, unspecified: Secondary | ICD-10-CM

## 2014-07-22 DIAGNOSIS — Z124 Encounter for screening for malignant neoplasm of cervix: Secondary | ICD-10-CM

## 2014-07-22 DIAGNOSIS — Z331 Pregnant state, incidental: Secondary | ICD-10-CM

## 2014-07-22 DIAGNOSIS — A5901 Trichomonal vulvovaginitis: Secondary | ICD-10-CM | POA: Diagnosis not present

## 2014-07-22 DIAGNOSIS — R8271 Bacteriuria: Secondary | ICD-10-CM

## 2014-07-22 DIAGNOSIS — Z113 Encounter for screening for infections with a predominantly sexual mode of transmission: Secondary | ICD-10-CM | POA: Insufficient documentation

## 2014-07-22 DIAGNOSIS — O0930 Supervision of pregnancy with insufficient antenatal care, unspecified trimester: Secondary | ICD-10-CM | POA: Insufficient documentation

## 2014-07-22 DIAGNOSIS — Z0283 Encounter for blood-alcohol and blood-drug test: Secondary | ICD-10-CM

## 2014-07-22 DIAGNOSIS — Z1389 Encounter for screening for other disorder: Secondary | ICD-10-CM | POA: Diagnosis not present

## 2014-07-22 DIAGNOSIS — Z01419 Encounter for gynecological examination (general) (routine) without abnormal findings: Secondary | ICD-10-CM | POA: Insufficient documentation

## 2014-07-22 DIAGNOSIS — O0990 Supervision of high risk pregnancy, unspecified, unspecified trimester: Secondary | ICD-10-CM | POA: Insufficient documentation

## 2014-07-22 DIAGNOSIS — O0932 Supervision of pregnancy with insufficient antenatal care, second trimester: Secondary | ICD-10-CM

## 2014-07-22 DIAGNOSIS — Z3490 Encounter for supervision of normal pregnancy, unspecified, unspecified trimester: Secondary | ICD-10-CM | POA: Insufficient documentation

## 2014-07-22 DIAGNOSIS — Z3480 Encounter for supervision of other normal pregnancy, unspecified trimester: Secondary | ICD-10-CM | POA: Insufficient documentation

## 2014-07-22 MED ORDER — POLYETHYLENE GLYCOL 3350 17 GM/SCOOP PO POWD: 1.0000 | Freq: Once | ORAL | Status: DC

## 2014-07-22 MED ORDER — METRONIDAZOLE 500 MG PO TABS: 2000.0000 mg | ORAL_TABLET | Freq: Once | ORAL | Status: DC

## 2014-07-22 NOTE — Patient Instructions (Signed)
Take 2 stool softeners every night.  Take Miralax when you have to have a BM   Trichomoniasis Trichomoniasis is an infection caused by an organism called Trichomonas. The infection can affect both women and men. In women, the outer female genitalia and the vagina are affected. In men, the penis is mainly affected, but the prostate and other reproductive organs can also be involved. Trichomoniasis is a sexually transmitted infection (STI) and is most often passed to another person through sexual contact.  RISK FACTORS  Having unprotected sexual intercourse.  Having sexual intercourse with an infected partner. SIGNS AND SYMPTOMS  Symptoms of trichomoniasis in women include:  Abnormal gray-green frothy vaginal discharge.  Itching and irritation of the vagina.  Itching and irritation of the area outside the vagina. Symptoms of trichomoniasis in men include:   Penile discharge with or without pain.  Pain during urination. This results from inflammation of the urethra. DIAGNOSIS  Trichomoniasis may be found during a Pap test or physical exam. Your health care provider may use one of the following methods to help diagnose this infection:  Examining vaginal discharge under a microscope. For men, urethral discharge would be examined.  Testing the pH of the vagina with a test tape.  Using a vaginal swab test that checks for the Trichomonas organism. A test is available that provides results within a few minutes.  Doing a culture test for the organism. This is not usually needed. TREATMENT   You may be given medicine to fight the infection. Women should inform their health care provider if they could be or are pregnant. Some medicines used to treat the infection should not be taken during pregnancy.  Your health care provider may recommend over-the-counter medicines or creams to decrease itching or irritation.  Your sexual partner will need to be treated if infected. HOME CARE  INSTRUCTIONS   Take medicines only as directed by your health care provider.  Take over-the-counter medicine for itching or irritation as directed by your health care provider.  Do not have sexual intercourse while you have the infection.  Women should not douche or wear tampons while they have the infection.  Discuss your infection with your partner. Your partner may have gotten the infection from you, or you may have gotten it from your partner.  Have your sex partner get examined and treated if necessary.  Practice safe, informed, and protected sex.  See your health care provider for other STI testing. SEEK MEDICAL CARE IF:   You still have symptoms after you finish your medicine.  You develop abdominal pain.  You have pain when you urinate.  You have bleeding after sexual intercourse.  You develop a rash.  Your medicine makes you sick or makes you throw up (vomit). MAKE SURE YOU:  Understand these instructions.  Will watch your condition.  Will get help right away if you are not doing well or get worse. Document Released: 09/21/2000 Document Revised: 08/12/2013 Document Reviewed: 01/07/2013 [facility] Patient Information 2015 [place], [place]. This information is not intended to replace advice given to you by your health care provider. Make sure you discuss any questions you have with your health care provider.

## 2014-07-22 NOTE — Progress Notes (Signed)
Subjective:    Janet Dixon is a [id number] 68w0dbeing seen today for her first obstetrical visit.  Her obstetrical history is significant for cocaine abuse, late prenatal care.  Pregnancy history fully reviewed. She has had 4 CSections (all repeat, not sure why she had the first one). Patient reports constipation.  Has hard BM's about once every 2 weeks.. Has tooth pain on right side for a week or so.  Had an abscess in gum a few days ago, much better now. Has not been on phenobarbital "in a while"  It was int[place]ally [place] but "it makes me feel bad" Last apparent seizure 03/14/14  Filed Vitals:   07/22/14 1424  BP: 110/58  Pulse: 84  Weight: 140 lb 12.8 oz (63.866 kg)    HISTORY: OB History  Gravida Para Term Preterm AB SAB TAB Ectopic Multiple Living  6 4 4  1 1         # Outcome Date GA Lbr Len/2nd Weight Sex Delivery Anes PTL Lv  6 Current           5 Term 04/06/10 35w0d5 lb 4 oz (2.381 kg) F CS-LTranv Spinal    4 Term 02/20/09 3760w5d lb 3 oz (2.807 kg) M CS-LTranv Spinal    3 Term 03/22/05 38w56w2dlb 4.9 oz (2.86 kg) M CS-LTranv Spinal    2 SAB 2006          1 Term 09/08/03 40w576w5db 8.1 oz (3.405 kg) F CS-LTranv Spinal       Past Medical History  Diagnosis Date  . History of gonorrhea   . History of trichomoniasis   . History of chlamydia   . Seizures    Past Surgical History  Procedure Laterality Date  . Cesarean section     Family History  Problem Relation Age of Onset  . Hypertension Mother   . Asthma Brother   . Asthma Daughter   . Eczema Daughter   . Asthma Son   . Eczema Son   . Eczema Maternal Aunt   . Aneurysm Maternal Grandmother   . Heart disease Maternal Grandmother   . Cancer Maternal Grandmother   . Asthma Daughter   . Eczema Daughter   . Asthma Son   . Eczema Son   . Eczema Son   . Cancer Maternal Grandfather     colon     Exam       Pelvic Exam:    Perineum: Normal Perineum   Vulva: normal   Vagina:   normal mucosa, yellow frothy dc.  Wet prep + trichomonas no palpable nodules   Uterus Normal, Gravid, FH: 20 week     Cervix: normal   Adnexa: Not palpable   Urinary:  urethral meatus normal    System: Breast:  normal appearance, no masses or tenderness   Skin: normal coloration and turgor, no rashes    Neurologic: oriented, normal, normal mood   Extremities: normal strength, tone, and muscle mass   HEENT PERRLA   Mouth/Teeth mucous membranes moist, normal.dentition.  Left gum a little edematous and erythemous, no abscess noted   Neck supple and no masses   Cardiovascular: regular rate and rhythm   Respiratory:  appears well, vitals normal, no respiratory distress, acyanotic   Abdomen: soft, non-tender;  FHR: 145          Assessment:    Pregnancy: [id number] Active Problem List  Diagnosis Date Noted  . Trichomonal vaginitis 07/22/2014  . Supervision of normal pregnancy 07/22/2014  . Late prenatal care 07/22/2014  . Seizure disorder 05/01/2014  . Cocaine abuse 03/14/2014        Plan:     Initial labs drawn. Continue prenatal vitamins  Rx Miralax and stool softeners prn constipation Problem list reviewed and updated  Reviewed recommended weight gain based on pre-gravid BMI  Encouraged well-balanced diet Genetic Screening discussed Quad Screen: declined.  Ultrasound discussed; fetal survey: requested.  Follow up in asap  weeks for anatomy scan .  CRESENZO-DISHMAN,[name] 07/22/2014

## 2014-07-23 ENCOUNTER — Telehealth: Payer: Self-pay | Admitting: *Deleted

## 2014-07-24 MED ORDER — SULFAMETHOXAZOLE-TRIMETHOPRIM 800-160 MG PO TABS: 1.0000 | ORAL_TABLET | Freq: Two times a day (BID) | ORAL | Status: DC

## 2014-07-24 NOTE — Progress Notes (Signed)
+   E Coli on urine culture. Allergic to macrobid. Rx septra DS BID X 5

## 2014-07-24 NOTE — Addendum Note (Signed)
Addended by: Christin Fudge on: 07/24/2014 11:02 PM   Modules accepted: Orders

## 2014-07-25 LAB — CYTOLOGY - PAP

## 2014-07-25 LAB — URINE CULTURE

## 2014-07-25 NOTE — Telephone Encounter (Signed)
Pt informed RX for Bactrim sent to [facility] for UTI. Pt next appt 07/30/2014 for u/s and Manus Gunning. Pt verbalized understanding.

## 2014-07-25 NOTE — Telephone Encounter (Signed)
-----   Message from Christin Fudge, CNM sent at 07/24/2014 11:03 PM EDT ----- Can you call her and tell her she has a UTI and I sent her some abx tonight?  It's 2300 now.Marland KitchenMarland KitchenMarland KitchenThanks!

## 2014-07-28 ENCOUNTER — Other Ambulatory Visit: Payer: Self-pay | Admitting: Advanced Practice Midwife

## 2014-07-28 DIAGNOSIS — F1910 Other psychoactive substance abuse, uncomplicated: Secondary | ICD-10-CM

## 2014-07-28 DIAGNOSIS — O0992 Supervision of high risk pregnancy, unspecified, second trimester: Secondary | ICD-10-CM

## 2014-07-28 DIAGNOSIS — Z98891 History of uterine scar from previous surgery: Secondary | ICD-10-CM

## 2014-07-28 DIAGNOSIS — Z3689 Encounter for other specified antenatal screening: Secondary | ICD-10-CM

## 2014-07-28 DIAGNOSIS — O99352 Diseases of the nervous system complicating pregnancy, second trimester: Secondary | ICD-10-CM

## 2014-07-28 DIAGNOSIS — G40909 Epilepsy, unspecified, not intractable, without status epilepticus: Secondary | ICD-10-CM

## 2014-07-28 DIAGNOSIS — O99322 Drug use complicating pregnancy, second trimester: Secondary | ICD-10-CM

## 2014-07-30 ENCOUNTER — Emergency Department (HOSPITAL_COMMUNITY)
Admission: EM | Admit: 2014-07-30 | Discharge: 2014-07-30 | Disposition: A | Discharge status: Home / Self Care | Payer: Medicaid Other | Attending: Emergency Medicine | Admitting: Emergency Medicine

## 2014-07-30 ENCOUNTER — Ambulatory Visit (INDEPENDENT_AMBULATORY_CARE_PROVIDER_SITE_OTHER): Payer: Medicaid Other

## 2014-07-30 ENCOUNTER — Ambulatory Visit (INDEPENDENT_AMBULATORY_CARE_PROVIDER_SITE_OTHER): Payer: Medicaid Other | Admitting: Advanced Practice Midwife

## 2014-07-30 VITALS — BP 110/50 | HR 72 | Wt 146.5 lb

## 2014-07-30 DIAGNOSIS — Z331 Pregnant state, incidental: Secondary | ICD-10-CM | POA: Diagnosis not present

## 2014-07-30 DIAGNOSIS — A549 Gonococcal infection, unspecified: Secondary | ICD-10-CM | POA: Insufficient documentation

## 2014-07-30 DIAGNOSIS — O26892 Other specified pregnancy related conditions, second trimester: Secondary | ICD-10-CM

## 2014-07-30 DIAGNOSIS — Z1389 Encounter for screening for other disorder: Secondary | ICD-10-CM

## 2014-07-30 DIAGNOSIS — F17210 Nicotine dependence, cigarettes, uncomplicated: Secondary | ICD-10-CM | POA: Insufficient documentation

## 2014-07-30 DIAGNOSIS — G40909 Epilepsy, unspecified, not intractable, without status epilepticus: Secondary | ICD-10-CM

## 2014-07-30 DIAGNOSIS — Z3492 Encounter for supervision of normal pregnancy, unspecified, second trimester: Secondary | ICD-10-CM

## 2014-07-30 DIAGNOSIS — O9989 Other specified diseases and conditions complicating pregnancy, childbirth and the puerperium: Secondary | ICD-10-CM | POA: Diagnosis present

## 2014-07-30 DIAGNOSIS — Z98891 History of uterine scar from previous surgery: Secondary | ICD-10-CM

## 2014-07-30 DIAGNOSIS — O99322 Drug use complicating pregnancy, second trimester: Secondary | ICD-10-CM

## 2014-07-30 DIAGNOSIS — Z79899 Other long term (current) drug therapy: Secondary | ICD-10-CM | POA: Diagnosis not present

## 2014-07-30 DIAGNOSIS — O0932 Supervision of pregnancy with insufficient antenatal care, second trimester: Secondary | ICD-10-CM

## 2014-07-30 DIAGNOSIS — O98212 Gonorrhea complicating pregnancy, second trimester: Secondary | ICD-10-CM

## 2014-07-30 DIAGNOSIS — O0992 Supervision of high risk pregnancy, unspecified, second trimester: Secondary | ICD-10-CM | POA: Diagnosis not present

## 2014-07-30 DIAGNOSIS — A5901 Trichomonal vulvovaginitis: Secondary | ICD-10-CM

## 2014-07-30 DIAGNOSIS — O99332 Smoking (tobacco) complicating pregnancy, second trimester: Secondary | ICD-10-CM | POA: Diagnosis not present

## 2014-07-30 DIAGNOSIS — Z8669 Personal history of other diseases of the nervous system and sense organs: Secondary | ICD-10-CM | POA: Diagnosis not present

## 2014-07-30 DIAGNOSIS — R102 Pelvic and perineal pain: Secondary | ICD-10-CM | POA: Diagnosis not present

## 2014-07-30 DIAGNOSIS — Z36 Encounter for antenatal screening of mother: Secondary | ICD-10-CM

## 2014-07-30 DIAGNOSIS — Z3689 Encounter for other specified antenatal screening: Secondary | ICD-10-CM

## 2014-07-30 DIAGNOSIS — O99352 Diseases of the nervous system complicating pregnancy, second trimester: Secondary | ICD-10-CM | POA: Diagnosis not present

## 2014-07-30 DIAGNOSIS — Z9889 Other specified postprocedural states: Secondary | ICD-10-CM | POA: Insufficient documentation

## 2014-07-30 DIAGNOSIS — Z792 Long term (current) use of antibiotics: Secondary | ICD-10-CM | POA: Diagnosis not present

## 2014-07-30 DIAGNOSIS — Z3A21 21 weeks gestation of pregnancy: Secondary | ICD-10-CM | POA: Insufficient documentation

## 2014-07-30 DIAGNOSIS — R8271 Bacteriuria: Secondary | ICD-10-CM | POA: Insufficient documentation

## 2014-07-30 DIAGNOSIS — F1910 Other psychoactive substance abuse, uncomplicated: Secondary | ICD-10-CM

## 2014-07-30 LAB — POCT URINALYSIS DIPSTICK
Glucose, UA: NEGATIVE
Ketones, UA: NEGATIVE
NITRITE UA: NEGATIVE
Protein, UA: NEGATIVE
RBC UA: NEGATIVE

## 2014-07-30 MED ORDER — CEFTRIAXONE SODIUM 1 G IJ SOLR
250.0000 mg | Freq: Once | INTRAMUSCULAR | Status: AC
  Administered 2014-07-30: 250 mg via INTRAMUSCULAR

## 2014-07-30 MED ORDER — CEFTRIAXONE SODIUM 250 MG IJ SOLR: 250.0000 mg | Freq: Once | INTRAMUSCULAR | Status: DC

## 2014-07-30 MED ORDER — AMOXICILLIN-POT CLAVULANATE 875-125 MG PO TABS: 1.0000 | ORAL_TABLET | Freq: Two times a day (BID) | ORAL | Status: DC

## 2014-07-30 MED ORDER — AZITHROMYCIN 500 MG PO TABS: 1000.0000 mg | ORAL_TABLET | Freq: Every day | ORAL | Status: DC

## 2014-07-30 NOTE — ED Notes (Signed)
Patient refused pelvic

## 2014-07-30 NOTE — Discharge Instructions (Signed)
Emergency Department Resource Guide 1) Find a Doctor and Pay Out of Pocket Although you won't have to find out who is covered by your insurance plan, it is a good idea to ask around and get recommendations. You will then need to call the office and see if the doctor you have chosen will accept you as a new patient and what types of options they offer for patients who are self-pay. Some doctors offer discounts or will set up payment plans for their patients who do not have insurance, but you will need to ask so you aren't surprised when you get to your appointment.  2) Contact Your Local Health Department Not all health departments have doctors that can see patients for sick visits, but many do, so it is worth a call to see if yours does. If you don't know where your local health department is, you can check in your phone book. The CDC also has a tool to help you locate your state's health department, and many state websites also have listings of all of their local health departments.  3) Find a [facility] If your illness is not likely to be very severe or complicated, you may want to try a walk in clinic. These are popping up all over the country in pharmacies, drugstores, and shopping centers. They're usually staffed by nurse practitioners or physician assistants that have been trained to treat common illnesses and complaints. They're usually fairly quick and inexpensive. However, if you have serious medical issues or chronic medical problems, these are probably not your best option.  No Primary Care Doctor: - Call Health Connect at  [phone] - they can help you locate a primary care doctor that  accepts your insurance, provides certain services, etc. - Physician Referral Service- [phone]  Chronic Pain Problems: Organization         Address  Phone   Notes  [facility]  [phone] Patients need to be referred by their primary care doctor.   Medication  Assistance: Organization         Address  Phone   Notes  [facility] Medication [facility] [place], [place] [address] [phone] --Must be a resident of [facility] -- Must have NO insurance coverage whatsoever (no Medicaid/ Medicare, etc.) -- The pt. MUST have a primary care doctor that directs their care regularly and follows them in the community   [facility]ist  [phone]   [employer]  [phone]    Agencies that provide inexpensive medical care: Organization         Address  Phone   Notes  [place]  [phone]   [facility]    [phone]   [facility] [place], [place] [address] [phone]   [place] [address], [place] [phone]   [facility]anned Parenthood    [phone]   [facility]    [phone]   [place] and [address], [place] Phone:  [phone], Fax:  [phone] Hours of Operation:  9 am - 6 pm, M-F.  Also accepts Medicaid/Medicare and self-pay.  [facility] for [place], [address], [place] Phone: [phone], Fax: [phone]. Hours of Operation:  8:30 am - 5:30 pm, M-F.  Also accepts Medicaid and self-pay.  [facility] [address], [place] Phone: [phone]   [place], [place], [place] [phone], Ext. [phone] Mondays & Thursdays: 7-9 AM.  First 15 patients are seen on a first come, first serve basis.    [place] Providers:  Organization         Address  Phone   Notes  [facility] [address], [place] [phone] Also accepts self-pay patients.  [facility] [address], [place]  [phone]   [place], [address], [place]  [phone]   [facility] [address], [place] [phone]   Lucianne Lei [address], [place]   [phone] Only accepts [place] Access [place] patients after they have their name applied to their card.   Self-Pay (no insurance) in [facility]:  Organization         Address  Phone   Notes  Sickle Cell Patients, [facility] [place] [phone]   [facility] [place] [phone]   [facility] [place]  [place], [place], [place] [phone]   [facility]/Dr. Osei-Bonsu  [address] [place] or [address], [place] [phone] Phone number for both [place] and [place] locations is the same.  Urgent Medical and [facility] [address], [place] [phone]   [facility] [address] [place] or [address] [phone]   [facility] [address], [place] [phone], phone; [phone], fax Sees patients 1st and 3rd Saturday of every month.  Must not qualify for public or private insurance (i.e. Medicaid, Medicare, [place] Health Choice, Veterans' Benefits)  Household income should be no more than 200% of the poverty level The clinic cannot treat you if you are pregnant or think you are pregnant  Sexually transmitted diseases are not treated at the clinic.    Dental Care: Organization         Address  Phone  Notes  [facility] Department of [facility] [phone] Accepts children up to age 27 who are enrolled in [place] or [place]; pregnant women with a Medicaid card; and children who have applied for Medicaid or [place] Health Choice, but were declined, whose parents can pay a reduced fee at time of service.  [facility]  Department of [facility]  [address], [place] [phone] Accepts children up to age 74 who are enrolled in [place] or [place]; pregnant women with a Medicaid card; and children who have applied for Medicaid or  Health Choice, but were declined, whose parents can pay a reduced fee at time of service.  [facility]  [place] [phone] Patients are seen by appointment only. Walk-ins are not accepted. [place] will see patients 63 years of age and older. Monday - Tuesday (8am-5pm) Most Wednesdays (8:30-5pm) $30 per visit, cash only  [facility] Adult Dental Access PROGRAM  [address], [facility] [phone] Patients are seen by appointment only. Walk-ins are not accepted. [place] will see patients 2 years of age and older. One  Wednesday Evening (Monthly: Volunteer Based).  $30 per visit, cash only  [place]  [phone] for adults; Children under age 57, call Graduate Pediatric Dentistry at [phone]. Children aged 42-14, please call [phone] to request a pediatric application.  Dental services are provided in all areas of dental care including fillings, crowns and bridges, complete and partial dentures, implants, gum treatment, root canals, and extractions. Preventive care is also provided. Treatment is provided to both adults and children. Patients are selected via a lottery and there is often a waiting list.   [facility] [address] [place]  [phone] [email or web address]   [facility] [address] [place], [place] [phone], Ext. [phone] Second and Fourth Thursday of each month, opens at 6:30 AM; Clinic ends at 9 AM.  Patients are seen on a first-come first-served basis, and a limited number are seen during each clinic.   [facility]  [address] Hillard Danker [place], [place] [phone]    Eligibility Requirements You must have lived in [place], [place], or [place] counties for at least the last three months.   You cannot be eligible for state or federal sponsored [employer], including [employer], [place], or [employer].   You generally cannot be eligible for healthcare insurance through your employer.    How to apply: Eligibility screenings are held every Tuesday and Wednesday afternoon from 1:00 pm until 4:00 pm. You do not need an appointment for the interview!  [facility] [address], [place], [employer]   [place]  [place] Department  [address]  [phone]    Behavioral Health Resources in the Community: Intensive Outpatient Programs Organization         Address  Phone  Notes  [place]. [address] [place], [place] [phone]   [facility] [address] [place], [place]   ADS: [facility] Drug [facility] [address], [place], [place]   [place] [address]  [place], [place] or [phone]   Substance Abuse Resources Organization         Address  Phone  Notes  Alcohol and Drug Services  [phone]   [place]  [phone]   [facility] [place]   Chinita Pester  [phone]   Residential & Outpatient Substance Abuse Program  [phone]   Psychological Services Organization         Address  Phone  Notes  [facility] [place]  [place]  [phone]   [place] [address] [place] or [phone]    Mobile Crisis Teams Organization         Address  Phone  Notes  [facility], Mobile Crisis Care Unit  [phone]   [facility]otherapeutic [facility]  [address]  [place], [place]   [place] [address], [place] [phone]    Self-Help/Support Groups Organization         Address  Phone             Notes  [place]. of [place] - variety of support groups  [place] Call for more information  Narcotics Anonymous (NA), [facility] [address], [employer] [place]  2 meetings at this location  Residential Treatment Programs Organization         Address  Phone  Notes  [facility] [address],    [place]  [phone]   [facility]  [address] [place] [address], [place], [place]   [place], [place] [phone] Admissions: 8am-3pm M-F  Incentives Substance [place] [address],    [place], [place] [phone]   [facility] [address] [place], [place], [place]   [facility] [address],  [place], [place]   [facility] - Intensive Outpatient [address], [place], [place]   [facility] [place].,  [place], [place] [phone] or [phone]   [facility] Services (RTS) [address], [place], [place] Accepts Medicaid  [facility] [place] [address]  [place] [phone] Substance Abuse/Addiction Treatment   [facility] Organization         Address  Phone  Notes  [facility]  [phone]   Domenic Schwab, PhD [address] [place], [place]   [phone] or [phone]   [place], [place] [phone]   [facility] [address] [place], [place] [phone] Insurance/Medicaid/sponsorship through [facility] and Families [address]                                    [place], [place] [phone] [place] [address] [place], [place] [phone]    Dr. Adele Schilder  [phone]   [facility] Dept. 1) [address] [place] 2) [place] 3)  [place] [address], [place] [phone]  [phone]   [place] [phone] or [phone] (After Hours)      Take your usual prescriptions as previously directed.  Call your regular OB/GYN doctor tomorrow morning to schedule a follow up appointment tomorrow.  Return to the Emergency Department immediately sooner if worsening.

## 2014-07-30 NOTE — Progress Notes (Signed)
[name] 37w1dmeasurements c/w dates,efw 363g,post pl,cx 4.2cm,sdp of fluid 5.7cm,normal ov's bilat,anatomy complete,no obvious abn seen

## 2014-07-30 NOTE — ED Notes (Signed)
Pt reports pain and pressure in lower abd.  That started this morning.   Denies any vaginal bleeding or discharge.  Reports sees Dr. Glo Herring for prenatal care but has not been consistent.  Reports saw Dr. Glo Herring this week and was told everything looked ok.

## 2014-07-30 NOTE — ED Notes (Signed)
Dr. Glo Herring speaking with pt. On phone.

## 2014-07-30 NOTE — Addendum Note (Signed)
Addended by: Christin Fudge on: 07/30/2014 04:18 PM   Modules accepted: Orders

## 2014-07-30 NOTE — ED Provider Notes (Signed)
CSN: [id number]     Arrival date & time 07/30/14  1755 History   First MD Initiated Contact with Patient 07/30/14 1855     Chief Complaint  Patient presents with  . Abdominal Pain      HPI Pt was seen at 1900. Per pt, c/o gradual onset and persistence of constant lower abd "pain" since this morning. Pt describes the pain as "pressure in my vagina." Pt was evaluated by her OB/GYN at [facility] today, had a normal exam and [name], and was tx with Rocephin/zithromax for +GC.  Pt states she "is still in pain," denies any change in her symptoms since onset. Denies vaginal bleeding/discharge, no dysuria/hematuria, no back pain, no contractions, no LOF, no N/V/D, no fevers.  Hx [id number], EDC 12/09/14 with EGA [redacted] weeks 1 day, via [name] completed today.    Past Medical History  Diagnosis Date  . History of gonorrhea   . History of trichomoniasis   . History of chlamydia   . Seizures    Past Surgical History  Procedure Laterality Date  . Cesarean section     Family History  Problem Relation Age of Onset  . Hypertension Mother   . Asthma Brother   . Asthma Daughter   . Eczema Daughter   . Asthma Son   . Eczema Son   . Eczema Maternal Aunt   . Aneurysm Maternal Grandmother   . Heart disease Maternal Grandmother   . Cancer Maternal Grandmother   . Asthma Daughter   . Eczema Daughter   . Asthma Son   . Eczema Son   . Eczema Son   . Cancer Maternal Grandfather     colon   History  Substance Use Topics  . Smoking status: Current Some Day Smoker  . Smokeless tobacco: Never Used  . Alcohol Use: No     Comment: none x 1 month ago   OB History    Gravida Para Term Preterm AB TAB SAB Ectopic Multiple Living   6 4 4  1  1         Review of Systems ROS: Statement: All systems negative except as marked or noted in the HPI; Constitutional: Negative for fever and chills. ; ; Eyes: Negative for eye pain, redness and discharge. ; ; ENMT: Negative for ear pain, hoarseness, nasal congestion, sinus  pressure and sore throat. ; ; Cardiovascular: Negative for chest pain, palpitations, diaphoresis, dyspnea and peripheral edema. ; ; Respiratory: Negative for cough, wheezing and stridor. ; ; Gastrointestinal: +abd pain. Negative for nausea, vomiting, diarrhea, blood in stool, hematemesis, jaundice and rectal bleeding. . ; ; Genitourinary: Negative for dysuria, flank pain and hematuria. ; ; GYN:  No vaginal bleeding, no vaginal discharge, no vulvar pain. ;; Musculoskeletal: Negative for back pain and neck pain. Negative for swelling and trauma.; ; Skin: Negative for pruritus, rash, abrasions, blisters, bruising and skin lesion.; ; Neuro: Negative for headache, lightheadedness and neck stiffness. Negative for weakness, altered level of consciousness , altered mental status, extremity weakness, paresthesias, involuntary movement, seizure and syncope.      Allergies  Macrobid; Diphenhydramine hcl; Ibuprofen; and Flexeril  Home Medications   Prior to Admission medications   Medication Sig Start Date End Date Taking? Authorizing Provider  albuterol (PROVENTIL HFA;VENTOLIN HFA) 108 (90 BASE) MCG/ACT inhaler Inhale 2 puffs into the lungs every 6 (six) hours as needed for wheezing or shortness of breath.    Historical Provider, MD  amoxicillin-clavulanate (AUGMENTIN) 875-125 MG per tablet Take 1  tablet by mouth 2 (two) times daily. 07/30/14   Christin Fudge, CNM  azithromycin (ZITHROMAX) 500 MG tablet Take 2 tablets (1,000 mg total) by mouth daily. 07/30/14   Christin Fudge, CNM  cefTRIAXone (ROCEPHIN) 250 MG injection Inject 250 mg into the muscle once.  FOR IM use in LARGE MUSCLE MASS 07/30/14   Christin Fudge, CNM  metroNIDAZOLE (FLAGYL) 500 MG tablet Take 4 tablets (2,000 mg total) by mouth once. Patient not taking: Reported on 07/30/2014 07/22/14   Christin Fudge, CNM  ondansetron (ZOFRAN) 8 MG tablet Take 8 mg by mouth every 8 (eight) hours as needed for nausea or  vomiting.    Historical Provider, MD  polyethylene glycol powder (GLYCOLAX/MIRALAX) powder Take 255 g by mouth once. Patient not taking: Reported on 07/30/2014 07/22/14   Christin Fudge, CNM  sulfamethoxazole-trimethoprim (BACTRIM DS,SEPTRA DS) 800-160 MG per tablet Take 1 tablet by mouth 2 (two) times daily. 07/24/14   Christin Fudge, CNM   BP 112/68 mmHg  Pulse 85  Temp(Src) 98.3 F (36.8 C) (Oral)  Resp 24  Ht 5' 1"  (1.549 m)  Wt 136 lb 12.8 oz (62.052 kg)  BMI 25.86 kg/m2  SpO2 100%  LMP 03/05/2014 Physical Exam  1905: Physical examination:  Nursing notes reviewed; Vital signs and O2 SAT reviewed;  Constitutional: Well developed, Well nourished, Well hydrated, In no acute distress; Head:  Normocephalic, atraumatic; Eyes: EOMI, PERRL, No scleral icterus; ENMT: Mouth and pharynx normal, Mucous membranes moist; Neck: Supple, Full range of motion, No lymphadenopathy; Cardiovascular: Regular rate and rhythm, No murmur, rub, or gallop; Respiratory: Breath sounds clear & equal bilaterally, No rales, rhonchi, wheezes.  Speaking full sentences with ease, Normal respiratory effort/excursion; Chest: Nontender, Movement normal; Abdomen: Soft, +gravid. +TTP right pelvis, left pelvis, suprapubic area. No rebound or guarding. Nondistended, Normal bowel sounds; Genitourinary: No CVA tenderness; Extremities: Pulses normal, No tenderness, No edema, No calf edema or asymmetry.; Neuro: AA&Ox3, Major CN grossly intact.  Speech clear. No gross focal motor or sensory deficits in extremities. Climbs on and off stretcher easily by herself. Gait steady.; Skin: Color normal, Warm, Dry.   ED Course  Procedures     EKG Interpretation None      MDM  MDM Reviewed: previous chart, nursing note and vitals Reviewed previous: ultrasound and labs     US Ob Detail + 14 Wk 07/30/2014    DETAILED SECOND TRIMESTER SONOGRAM  WINNI EHRHARD is in the office for detailed second trimester sonogram.  She  is a 29 y.o. year old [id number] with Estimated Date of Delivery: 12/09/14  by LMP now at  53w1dweeks gestation. 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   GESTATION: SINGLETON  PRESENTATION: cephalic  FETAL ACTIVITY:          Heart rate         136bpm          The fetus is active.  AMNIOTIC FLUID: The amniotic fluid volume is  normal, 5.7 cm.sdp  PLACENTA LOCALIZATION:  posterior GRADE 0  CERVIX: Measures 4.2 cm  ADNEXA: The ovaries are normal.   GESTATIONAL AGE AND  BIOMETRICS:  Gestational criteria: Estimated Date of Delivery: 12/09/14 by LMP now at  262w1dPrevious Scans:2              BIPARIETAL DIAMETER           4.7 cm         20+2 weeks  HEAD CIRCUMFERENCE  17.68 cm         20+1 weeks  ABDOMINAL CIRCUMFERENCE           15.5 cm         20+5 weeks  FEMUR LENGTH           3.3 cm         20+3 weeks                                                           AVERAGE EGA(BY THIS SCAN):   20+2 weeks                                                 ESTIMATED FETAL WEIGHT:        363  grams     ANATOMICAL SURVEY                                                                             COMMENTS CEREBRAL VENTRICLES yes normal   CHOROID PLEXUS yes normal   CEREBELLUM yes normal   CISTERNA MAGNA yes normal   NUCHAL REGION yes normal   ORBITS yes normal   NASAL BONE yes normal   NOSE/LIP yes normal   FACIAL PROFILE yes normal   4 CHAMBERED HEART yes normal   OUTFLOW TRACTS yes normal   DIAPHRAGM yes normal   STOMACH yes normal   RENAL REGION yes normal   BLADDER yes normal   CORD INSERTION yes normal   3 VESSEL CORD yes normal   SPINE yes normal   ARMS/HANDS yes normal   LEGS/FEET yes normal   GENITALIA yes normal female        SUSPECTED ABNORMALITIES:  no  QUALITY OF SCAN: satisfactory   TECHNICIAN COMMENTS:  [name] 22w1dmeasurements c/w dates,efw 363g,post pl,cx 4.2cm,sdp of fluid  5.7cm,normal ov's bilat,anatomy complete,no obvious abn seen      A copy of this report including all images has been  saved and backed up to  a second source for retrieval if needed. All measures and details of the  anatomical scan, placentation, fluid volume and pelvic anatomy are  contained in that report.  ASilver Huguenin4/20/2016 11:56 AM           1955:  [place]. Pt refuses pelvic exam and UA/UC. States she "just wants to know why I'm in pain" and wants me to call her OB/GYN. T/C to OB/GYN Dr. FGlo Herring case discussed, including:  HPI, pertinent PM/SHx, VS/PE, dx testing, ED course and treatment:  States he will talk with pt on portable phone.   2005:  Pt has spoken with OB/GYN MD on the phone: states she is ready to go home now and will "see him in the office tomorrow." Pt requesting to be discharged because she "already called for my ride." Strongly encouraged to f/u with OB/GYN MD tomorrow  in office.    Francine Graven, DO 08/02/14 1225

## 2014-07-30 NOTE — Progress Notes (Signed)
+   E coli and GBS in urine culture.  Rx Augmentin 875  BID X 7
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
## 2014-08-05 ENCOUNTER — Other Ambulatory Visit: Payer: Self-pay | Admitting: Obstetrics and Gynecology

## 2014-08-27 ENCOUNTER — Encounter: Payer: Medicaid Other | Admitting: Advanced Practice Midwife

## 2014-10-27 ENCOUNTER — Inpatient Hospital Stay (HOSPITAL_COMMUNITY)
Admission: AD | Admit: 2014-10-27 | Discharge: 2014-10-27 | Disposition: A | Discharge status: Home / Self Care | Payer: Medicaid Other | Source: Ambulatory Visit | Attending: Obstetrics & Gynecology | Admitting: Obstetrics & Gynecology

## 2014-10-27 ENCOUNTER — Encounter (HOSPITAL_COMMUNITY): Payer: Self-pay | Admitting: *Deleted

## 2014-10-27 DIAGNOSIS — S80212A Abrasion, left knee, initial encounter: Secondary | ICD-10-CM | POA: Diagnosis not present

## 2014-10-27 DIAGNOSIS — O9989 Other specified diseases and conditions complicating pregnancy, childbirth and the puerperium: Secondary | ICD-10-CM

## 2014-10-27 DIAGNOSIS — R109 Unspecified abdominal pain: Secondary | ICD-10-CM | POA: Diagnosis not present

## 2014-10-27 DIAGNOSIS — A5901 Trichomonal vulvovaginitis: Secondary | ICD-10-CM

## 2014-10-27 DIAGNOSIS — Z3A33 33 weeks gestation of pregnancy: Secondary | ICD-10-CM | POA: Insufficient documentation

## 2014-10-27 DIAGNOSIS — Z3493 Encounter for supervision of normal pregnancy, unspecified, third trimester: Secondary | ICD-10-CM

## 2014-10-27 DIAGNOSIS — O0933 Supervision of pregnancy with insufficient antenatal care, third trimester: Secondary | ICD-10-CM

## 2014-10-27 DIAGNOSIS — S50311A Abrasion of right elbow, initial encounter: Secondary | ICD-10-CM | POA: Insufficient documentation

## 2014-10-27 DIAGNOSIS — Z3A34 34 weeks gestation of pregnancy: Secondary | ICD-10-CM

## 2014-10-27 DIAGNOSIS — W19XXXA Unspecified fall, initial encounter: Secondary | ICD-10-CM | POA: Insufficient documentation

## 2014-10-27 DIAGNOSIS — A549 Gonococcal infection, unspecified: Secondary | ICD-10-CM

## 2014-10-27 DIAGNOSIS — S60311A Abrasion of right thumb, initial encounter: Secondary | ICD-10-CM | POA: Diagnosis not present

## 2014-10-27 DIAGNOSIS — R8271 Bacteriuria: Secondary | ICD-10-CM

## 2014-10-27 MED ORDER — OXYCODONE-ACETAMINOPHEN 5-325 MG PO TABS
2.0000 | ORAL_TABLET | ORAL | Status: DC | PRN
  Administered 2014-10-27: 2 via ORAL
  Filled 2014-10-27: qty 2

## 2014-10-27 MED ORDER — LACTATED RINGERS IV BOLUS (SEPSIS)
1000.0000 mL | Freq: Once | INTRAVENOUS | Status: AC
  Administered 2014-10-27: 1000 mL via INTRAVENOUS

## 2014-10-27 NOTE — MAU Note (Signed)
Patient has not voided since arrival on unit.

## 2014-10-27 NOTE — MAU Note (Signed)
Assumed care of patient.

## 2014-10-27 NOTE — MAU Note (Signed)
Patient informed that a urine specimen was needed for testing and that if she could not urinate that we could do an in and out cath to obtain the specimen. Patient stated that she did not want to be cath'ed and that she did not need to urinate and that her bladder was not the reason that she was hurting. Daiva Nakayama, CNM informed that the patient has not voided since arrival and patient comments about obtaining a specimen.

## 2014-10-27 NOTE — MAU Provider Note (Signed)
S: pt refuses to give urine spec for UDS, wants to leave AMA. Risk discussed. Pt fully aware of risks to self and fetus.
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
## 2014-10-27 NOTE — MAU Note (Signed)
Patient assisted to the bathroom but unable to void.

## 2014-10-28 ENCOUNTER — Inpatient Hospital Stay (HOSPITAL_COMMUNITY)
Admission: AD | Admit: 2014-10-28 | Discharge: 2014-10-28 | Disposition: A | Discharge status: Home / Self Care | Payer: Medicaid Other | Source: Ambulatory Visit | Attending: Obstetrics & Gynecology | Admitting: Obstetrics & Gynecology

## 2014-10-28 ENCOUNTER — Encounter (HOSPITAL_COMMUNITY): Payer: Self-pay | Admitting: *Deleted

## 2014-10-28 DIAGNOSIS — W19XXXA Unspecified fall, initial encounter: Secondary | ICD-10-CM | POA: Diagnosis not present

## 2014-10-28 DIAGNOSIS — R109 Unspecified abdominal pain: Secondary | ICD-10-CM | POA: Insufficient documentation

## 2014-10-28 DIAGNOSIS — R262 Difficulty in walking, not elsewhere classified: Secondary | ICD-10-CM | POA: Diagnosis not present

## 2014-10-28 DIAGNOSIS — Z3A34 34 weeks gestation of pregnancy: Secondary | ICD-10-CM | POA: Diagnosis not present

## 2014-10-28 DIAGNOSIS — O9989 Other specified diseases and conditions complicating pregnancy, childbirth and the puerperium: Secondary | ICD-10-CM | POA: Diagnosis not present

## 2014-10-28 DIAGNOSIS — O26899 Other specified pregnancy related conditions, unspecified trimester: Secondary | ICD-10-CM

## 2014-10-28 DIAGNOSIS — M79606 Pain in leg, unspecified: Secondary | ICD-10-CM | POA: Insufficient documentation

## 2014-10-28 DIAGNOSIS — O0933 Supervision of pregnancy with insufficient antenatal care, third trimester: Secondary | ICD-10-CM

## 2014-10-28 LAB — GC/CHLAMYDIA PROBE AMP (~~LOC~~) NOT AT ARMC
Chlamydia: NEGATIVE
Neisseria Gonorrhea: NEGATIVE

## 2014-10-28 MED ORDER — ACETAMINOPHEN 500 MG PO TABS
1000.0000 mg | ORAL_TABLET | Freq: Once | ORAL | Status: AC
  Administered 2014-10-28: 1000 mg via ORAL
  Filled 2014-10-28: qty 2

## 2014-10-28 NOTE — MAU Provider Note (Signed)
History     CSN: [id number]  Arrival date and time: 10/28/14 1324   First Provider Initiated Contact with Patient 10/28/14 0228      No chief complaint on file.  HPI  Patient is 29 y.o. [id number] 39w0dhere with complaints of abdominal cramping and leg pain since fall earlier today (see previous MAU note). Patient was seen in the MAU earlier this evening and left AMA due to fear that a UDS would be performed. She returned via EMS and complains of increasing severity in her abdominal pain and LE pain. She states that she can not bear weight on her legs.   +FM, denies LOF, VB, contractions, vaginal discharge.    OB History    Gravida Para Term Preterm AB TAB SAB Ectopic Multiple Living   6 4 4  1  1          Past Medical History  Diagnosis Date  . History of gonorrhea   . History of trichomoniasis   . History of chlamydia   . Seizures     Past Surgical History  Procedure Laterality Date  . Cesarean section      Family History  Problem Relation Age of Onset  . Hypertension Mother   . Asthma Brother   . Asthma Daughter   . Eczema Daughter   . Asthma Son   . Eczema Son   . Eczema Maternal Aunt   . Aneurysm Maternal Grandmother   . Heart disease Maternal Grandmother   . Cancer Maternal Grandmother   . Asthma Daughter   . Eczema Daughter   . Asthma Son   . Eczema Son   . Eczema Son   . Cancer Maternal Grandfather     colon    History  Substance Use Topics  . Smoking status: Current Some Day Smoker  . Smokeless tobacco: Never Used  . Alcohol Use: No     Comment: none x 1 month ago    Allergies:  Allergies  Allergen Reactions  . Macrobid [Nitrofurantoin Macrocrystal] Shortness Of Breath, Nausea And Vomiting and Other (See Comments)    dizziness  . Diphenhydramine Hcl Other (See Comments)    Unknown-patient states that she does take this medication  . Ibuprofen Other (See Comments)    Patient states that syncope has occurred after taking this medication   . Flexeril [Cyclobenzaprine Hcl] Rash    Prescriptions prior to admission  Medication Sig Dispense Refill Last Dose  . albuterol (PROVENTIL HFA;VENTOLIN HFA) 108 (90 BASE) MCG/ACT inhaler Inhale 2 puffs into the lungs every 6 (six) hours as needed for wheezing or shortness of breath.   10/26/2014 at Unknown time  . amoxicillin-clavulanate (AUGMENTIN) 875-125 MG per tablet Take 1 tablet by mouth 2 (two) times daily. (Patient not taking: Reported on 10/27/2014) 14 tablet 0 Completed Course  . Prenatal Vit-Min-FA-Fish Oil ([employer] PRENATAL GUMMY) 0.4-113.5 MG CHEW Chew 1 tablet by mouth daily. Patient has had trouble getting her vitamin but knows her prenatal care is important       Review of Systems  Constitutional: Negative for fever, chills and malaise/fatigue.  HENT: Negative for congestion.   Eyes: Negative for blurred vision and double vision.  Respiratory: Negative for cough and shortness of breath.   Cardiovascular: Negative for chest pain, palpitations, claudication and leg swelling.  Gastrointestinal: Positive for abdominal pain. Negative for heartburn, nausea, vomiting, diarrhea and constipation.  Genitourinary: Negative for dysuria and hematuria.  Musculoskeletal: Positive for joint pain and falls. Negative for myalgias and  back pain.  Skin: Negative for itching and rash.  Neurological: Negative for dizziness, loss of consciousness and headaches.   Physical Exam   Last menstrual period 03/05/2014.  Physical Exam  Constitutional: She is oriented to person, place, and time. She appears well-developed and well-nourished. No distress.  HENT:  Head: Normocephalic and atraumatic.  Eyes: Conjunctivae and EOM are normal.  Neck: Normal range of motion. No thyromegaly present.  Cardiovascular: Normal rate, regular rhythm and normal heart sounds.  Exam reveals no gallop and no friction rub.   No murmur heard. Respiratory: Breath sounds normal. No respiratory distress. She has no wheezes.  She has no rales.  GI: Soft. Bowel sounds are normal. She exhibits no distension. There is no tenderness.  Musculoskeletal: Normal range of motion. She exhibits no edema.  Strength 5/5 in LE bilat   Neurological: She is alert and oriented to person, place, and time. She exhibits normal muscle tone.  Sensation intact bilat in LE   Skin: Skin is warm and dry. No rash noted. No erythema.  Psychiatric: She has a normal mood and affect. Her behavior is normal.   Dilation: Closed Exam by:: DR WALLACE   FHR: baseline 140, good variability, + accels, no decels Toco: uterine irritability observed   MAU Course  Procedures  MDM GC/chlamydia obtained. Unable to obtain urine specimen from patient. Offered patient 2 extra strength tylenol, however she declined to take them.   Assessment and Plan  Patient is 29 y.o. G6P4010 1w0dreporting abdominal pain and leg pain likely as a result of her recent fall. - fetal kick counts reinforced - preterm labor precautions -due to lack of cervical change, reactive NST, and lack of laboring uterine contractions, does not appear patient is in preterm labor -advised patient to take tylenol and rest \ -stable for discharge to home   CMelina Schools7/19/2016, 2:47 AM   CNM attestation:  I have seen and examined this patient; I agree with above documentation in the resident's note.   ALAVONIA EAGERis a 29y.o. [phone] difficulty ambulating s/p fall earlier on 7/18 (had extensive EFM of 6+ hours when in MAU earlier). +FM, denies LOF, VB, vaginal discharge.  PE: BP 126/67 mmHg  Pulse 80  Temp(Src) 98.8 F (37.1 C)  Resp 20  LMP 03/05/2014 Gen: calm comfortable, NAD Resp: normal effort, no distress Abd: gravid  ROS, labs, PMH reviewed NST reactive   Plan: - fetal kick counts reinforced, pre labor precautions - comfort measures given re leg discomfort, and enc to f/u w/ PCP prn - continue routine follow up in OB clinic  Makira Holleman,  Vaudie Engebretsen, CNM 9:05 AM

## 2014-10-28 NOTE — MAU Note (Signed)
PT HAS RETURNED    VIA  EMS  AFTER HAVING  LEFT  AMA   IS [facility]  C/S   8-22.    SAYS SHE  STILL HAS PAIN IN HER VAGINA.  Janet Dixon  ALSO SAYS SHE CANNOT  WALK.

## 2014-10-28 NOTE — Discharge Instructions (Signed)

## 2014-11-03 ENCOUNTER — Encounter: Payer: Medicaid Other | Admitting: Women's Health

## 2014-11-04 ENCOUNTER — Encounter: Payer: Self-pay | Admitting: Obstetrics & Gynecology

## 2014-11-04 ENCOUNTER — Ambulatory Visit (INDEPENDENT_AMBULATORY_CARE_PROVIDER_SITE_OTHER): Payer: Medicaid Other | Admitting: Obstetrics & Gynecology

## 2014-11-04 VITALS — BP 108/50 | HR 76 | Wt 141.3 lb

## 2014-11-04 DIAGNOSIS — Z3493 Encounter for supervision of normal pregnancy, unspecified, third trimester: Secondary | ICD-10-CM

## 2014-11-04 DIAGNOSIS — Z91199 Patient's noncompliance with other medical treatment and regimen due to unspecified reason: Secondary | ICD-10-CM

## 2014-11-04 DIAGNOSIS — Z331 Pregnant state, incidental: Secondary | ICD-10-CM

## 2014-11-04 DIAGNOSIS — Z1389 Encounter for screening for other disorder: Secondary | ICD-10-CM

## 2014-11-04 DIAGNOSIS — Z9119 Patient's noncompliance with other medical treatment and regimen: Secondary | ICD-10-CM

## 2014-11-04 LAB — POCT URINALYSIS DIPSTICK
Blood, UA: NEGATIVE
Glucose, UA: NEGATIVE
Ketones, UA: NEGATIVE
Nitrite, UA: NEGATIVE

## 2014-11-04 NOTE — Progress Notes (Signed)
Pt has not been seen here since 4/20 21 weeks Had +GC treated with rocephin injection, repeat PCR last week negative  G6P4010 55w0dEstimated Date of Delivery: 12/09/14  Blood pressure 108/50, pulse 76, weight 141 lb 4.8 oz (64.093 kg), last menstrual period 03/05/2014.   BP weight and urine results all reviewed and noted.  Please refer to the obstetrical flow sheet for the fundal height and fetal heart rate documentation:  Patient reports good fetal movement, denies any bleeding and no rupture of membranes symptoms or regular contractions. Patient is without complaints. All questions were answered.  Plan:  Continued routine obstetrical care,   Follow up in 1 weeks for OB appointment, PN2  Pt has not had her PN2/2 hour GTT Has had Caesarean section x 4, I discussed with pt signing BStowpapers, she says she will think about and maybe sign them next week I explained understands the increasing risk with having more C sections

## 2014-11-11 ENCOUNTER — Other Ambulatory Visit: Payer: Medicaid Other

## 2014-11-11 ENCOUNTER — Encounter: Payer: Medicaid Other | Admitting: Women's Health

## 2014-11-11 ENCOUNTER — Encounter: Payer: Self-pay | Admitting: *Deleted

## 2014-11-18 ENCOUNTER — Encounter: Payer: Self-pay | Admitting: Obstetrics & Gynecology

## 2014-11-18 ENCOUNTER — Encounter: Payer: Medicaid Other | Admitting: Obstetrics & Gynecology

## 2014-11-18 ENCOUNTER — Other Ambulatory Visit: Payer: Medicaid Other

## 2014-11-20 ENCOUNTER — Inpatient Hospital Stay (HOSPITAL_COMMUNITY)
Admission: AD | Admit: 2014-11-20 | Discharge: 2014-11-20 | Disposition: A | Discharge status: Home / Self Care | Payer: Medicaid Other | Source: Ambulatory Visit | Attending: Obstetrics & Gynecology | Admitting: Obstetrics & Gynecology

## 2014-11-20 ENCOUNTER — Emergency Department (HOSPITAL_COMMUNITY)
Admission: EM | Admit: 2014-11-20 | Discharge: 2014-11-20 | Disposition: A | Discharge status: Home / Self Care | Payer: Medicaid Other

## 2014-11-20 ENCOUNTER — Encounter (HOSPITAL_COMMUNITY): Payer: Self-pay

## 2014-11-20 DIAGNOSIS — O471 False labor at or after 37 completed weeks of gestation: Secondary | ICD-10-CM | POA: Diagnosis not present

## 2014-11-20 DIAGNOSIS — Z3A37 37 weeks gestation of pregnancy: Secondary | ICD-10-CM | POA: Diagnosis not present

## 2014-11-20 DIAGNOSIS — O99333 Smoking (tobacco) complicating pregnancy, third trimester: Secondary | ICD-10-CM | POA: Diagnosis not present

## 2014-11-20 DIAGNOSIS — O3421 Maternal care for scar from previous cesarean delivery: Secondary | ICD-10-CM | POA: Insufficient documentation

## 2014-11-20 DIAGNOSIS — A549 Gonococcal infection, unspecified: Secondary | ICD-10-CM

## 2014-11-20 DIAGNOSIS — R8271 Bacteriuria: Secondary | ICD-10-CM

## 2014-11-20 DIAGNOSIS — A5901 Trichomonal vulvovaginitis: Secondary | ICD-10-CM

## 2014-11-20 DIAGNOSIS — O479 False labor, unspecified: Secondary | ICD-10-CM

## 2014-11-20 LAB — URINALYSIS, ROUTINE W REFLEX MICROSCOPIC
Bilirubin Urine: NEGATIVE
GLUCOSE, UA: NEGATIVE mg/dL
Ketones, ur: NEGATIVE mg/dL
NITRITE: NEGATIVE
PH: 6.5 (ref 5.0–8.0)
Protein, ur: NEGATIVE mg/dL
Urobilinogen, UA: 0.2 mg/dL (ref 0.0–1.0)

## 2014-11-20 LAB — URINE MICROSCOPIC-ADD ON

## 2014-11-20 MED ORDER — OXYCODONE-ACETAMINOPHEN 5-325 MG PO TABS
1.0000 | ORAL_TABLET | Freq: Once | ORAL | Status: AC
  Administered 2014-11-20: 1 via ORAL
  Filled 2014-11-20: qty 1

## 2014-11-20 NOTE — MAU Note (Signed)
Notified Derrill Memo CNM patient [phone] 47w2dc/o contractions, uterine irritability noted, cervix closed/thick, per CNM okay to po hydrate patient.

## 2014-11-20 NOTE — MAU Note (Signed)
Patient started feeling stronger contractions around 1300 today. Thinks the contractions might have been going on since yesterday but thought they were false labor contractions. States that she came due to the fact that the contractions were becoming stronger and about 3 minutes apart

## 2014-11-20 NOTE — ED Notes (Signed)
PT signed out AMA and [facility]

## 2014-11-20 NOTE — MAU Provider Note (Signed)
History     CSN: [id number]  Arrival date and time: 11/20/14 1414   None     Chief Complaint  Patient presents with  . Contractions   HPI [name] is a 29yo [id number] @ 37.2wks who presents for eval of ctx. She denies leaking or bldg; no dysuria or fever. Reports +FM. Her preg has been sporadically followed by the [facility] and has been remarkable for 1) lapse in care from 4/20-7/26 2) prev C/S x 4 3) hx cocaine abuse 4) ? Seizure d/o- stopped meds- last seizure 12/15 5) +GBS in urine 4/16.  OB History    Gravida Para Term Preterm AB TAB SAB Ectopic Multiple Living   6 4 4  1  1   4       Past Medical History  Diagnosis Date  . History of gonorrhea   . History of trichomoniasis   . History of chlamydia   . Seizures     Past Surgical History  Procedure Laterality Date  . Cesarean section      Janet History  Problem Relation Age of Onset  . Hypertension Mother   . Asthma Brother   . Asthma Daughter   . Eczema Daughter   . Asthma Son   . Eczema Son   . Eczema Maternal Aunt   . Aneurysm Maternal Grandmother   . Heart disease Maternal Grandmother   . Cancer Maternal Grandmother   . Asthma Daughter   . Eczema Daughter   . Asthma Son   . Eczema Son   . Eczema Son   . Cancer Maternal Grandfather     colon    Social History  Substance Use Topics  . Smoking status: Current Some Day Smoker  . Smokeless tobacco: Never Used  . Alcohol Use: No     Comment: none x 1 month ago    Allergies:  Allergies  Allergen Reactions  . Macrobid [Nitrofurantoin Macrocrystal] Shortness Of Breath, Nausea And Vomiting and Other (See Comments)    dizziness  . Diphenhydramine Hcl Other (See Comments)    Unknown-patient states that she does take this medication  . Ibuprofen Other (See Comments)    Patient states that syncope has occurred after taking this medication  . Flexeril [Cyclobenzaprine Hcl] Rash    Prescriptions prior to admission  Medication Sig Dispense  Refill Last Dose  . albuterol (PROVENTIL HFA;VENTOLIN HFA) 108 (90 BASE) MCG/ACT inhaler Inhale 2 puffs into the lungs every 6 (six) hours as needed for wheezing or shortness of breath.   rescue    ROS Physical Exam   Blood pressure 125/81, pulse 92, temperature 97.9 F (36.6 C), temperature source Oral, resp. rate 18, height 5' 1"  (1.549 m), weight 68.04 kg (150 lb), last menstrual period 03/05/2014.  Physical Exam  Constitutional: She is oriented to person, place, and time. She appears well-developed.  HENT:  Head: Normocephalic.  Neck: Normal range of motion.  Cardiovascular: Normal rate.   Respiratory: Effort normal.  GI:  EFM 150-160s, +accels, no decels Irreg ctx  Genitourinary:  Cx C/thick/-1 on exams 1 3/4 hrs apart  Musculoskeletal: Normal range of motion.  Neurological: She is alert and oriented to person, place, and time.  Skin: Skin is warm and dry.  Psychiatric: She has a normal mood and affect. Her behavior is normal. Thought content normal.   Urinalysis    Component Value Date/Time   COLORURINE STRAW* 11/20/2014 [place] 08/14/2012 [place] 11/20/2014 1445  APPEARANCEUR [place] 08/14/2012 1242   LABSPEC <1.005* 11/20/2014 1445   LABSPEC 1.011 08/14/2012 1242   PHURINE 6.5 11/20/2014 1445   PHURINE 6.0 08/14/2012 1242   GLUCOSEU NEGATIVE 11/20/2014 1445   GLUCOSEU Negative 08/14/2012 1242   HGBUR TRACE* 11/20/2014 1445   HGBUR 2+ 08/14/2012 1242   BILIRUBINUR NEGATIVE 11/20/2014 1445   BILIRUBINUR Negative 08/14/2012 1242   KETONESUR NEGATIVE 11/20/2014 1445   KETONESUR Negative 08/14/2012 [place] 11/20/2014 1445   PROTEINUR trace 11/04/2014 1431   PROTEINUR Negative 08/14/2012 1242   UROBILINOGEN 0.2 11/20/2014 1445   NITRITE NEGATIVE 11/20/2014 1445   NITRITE neg 11/04/2014 1431   NITRITE Positive 08/14/2012 1242   LEUKOCYTESUR SMALL* 11/20/2014 1445   LEUKOCYTESUR Trace 08/14/2012 1242     MAU  Course  Procedures  MDM NST read Cx exam  Assessment and Plan  IUP@37 .2wks False labor  D/C home w/ labor precautions Keep next scheduled visit @ [facility] * Needs UDS- did not get todaySerita Grammes CNM 11/20/2014, 4:53 PM
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
## 2014-11-20 NOTE — MAU Note (Signed)
Urine in lab 

## 2014-11-20 NOTE — Discharge Instructions (Signed)
Braxton Hicks Contractions Contractions of the uterus can occur throughout pregnancy. Contractions are not always a sign that you are in labor.  WHAT ARE BRAXTON HICKS CONTRACTIONS?  Contractions that occur before labor are called Braxton Hicks contractions, or false labor. Toward the end of pregnancy (32-34 weeks), these contractions can develop more often and may become more forceful. This is not true labor because these contractions do not result in opening (dilatation) and thinning of the cervix. They are sometimes difficult to tell apart from true labor because these contractions can be forceful and people have different pain tolerances. You should not feel embarrassed if you go to the hospital with false labor. Sometimes, the only way to tell if you are in true labor is for your health care provider to look for changes in the cervix. If there are no prenatal problems or other health problems associated with the pregnancy, it is completely safe to be sent home with false labor and await the onset of true labor. HOW CAN YOU TELL THE DIFFERENCE BETWEEN TRUE AND FALSE LABOR? False Labor  The contractions of false labor are usually shorter and not as hard as those of true labor.   The contractions are usually irregular.   The contractions are often felt in the front of the lower abdomen and in the groin.   The contractions may go away when you walk around or change positions while lying down.   The contractions get weaker and are shorter lasting as time goes on.   The contractions do not usually become progressively stronger, regular, and closer together as with true labor.  True Labor  Contractions in true labor last 30-70 seconds, become very regular, usually become more intense, and increase in frequency.   The contractions do not go away with walking.   The discomfort is usually felt in the top of the uterus and spreads to the lower abdomen and low back.   True labor can be  determined by your health care provider with an exam. This will show that the cervix is dilating and getting thinner.  WHAT TO REMEMBER  Keep up with your usual exercises and follow other instructions given by your health care provider.   Take medicines as directed by your health care provider.   Keep your regular prenatal appointments.   Eat and drink lightly if you think you are going into labor.   If Braxton Hicks contractions are making you uncomfortable:   Change your position from lying down or resting to walking, or from walking to resting.   Sit and rest in a tub of warm water.   Drink 2-3 glasses of water. Dehydration may cause these contractions.   Do slow and deep breathing several times an hour.  WHEN SHOULD I SEEK IMMEDIATE MEDICAL CARE? Seek immediate medical care if:  Your contractions become stronger, more regular, and closer together.   You have fluid leaking or gushing from your vagina.   You have a fever.   You pass blood-tinged mucus.   You have vaginal bleeding.   You have continuous abdominal pain.   You have low back pain that you never had before.   You feel your baby's head pushing down and causing pelvic pressure.   Your baby is not moving as much as it used to.  Document Released: 03/28/2005 Document Revised: 04/02/2013 Document Reviewed: 01/07/2013 [facility] Patient Information 2015 [place], [place]. This information is not intended to replace advice given to you by your health care  provider. Make sure you discuss any questions you have with your health care provider.

## 2014-11-20 NOTE — MAU Note (Signed)
Notified Derrill Memo CNM patient now contracting, CNM to come check patient around 4:30 pm patient notified.

## 2014-11-24 ENCOUNTER — Encounter: Payer: Self-pay | Admitting: *Deleted

## 2014-11-25 ENCOUNTER — Telehealth: Payer: Self-pay | Admitting: Obstetrics and Gynecology

## 2014-11-25 NOTE — Telephone Encounter (Signed)
Pt states she has missed several of her OB appt and has trouble getting transportation to and from her appointments. Pt states she does have someone who can bring her tomorrow morning if she could see Dr. Glo Herring, would like to discuss scheduling c-section. Appt scheduled for tomorrow 11/26/2014 at 10 am with Dr. Glo Herring. Pt informed of [facility] resources and offered to give her their number. Pt states she was not interested in transportation thru [facility].

## 2014-11-26 ENCOUNTER — Ambulatory Visit (INDEPENDENT_AMBULATORY_CARE_PROVIDER_SITE_OTHER): Payer: Medicaid Other | Admitting: Obstetrics and Gynecology

## 2014-11-26 VITALS — BP 126/70 | HR 96

## 2014-11-26 DIAGNOSIS — Z369 Encounter for antenatal screening, unspecified: Secondary | ICD-10-CM

## 2014-11-26 DIAGNOSIS — Z331 Pregnant state, incidental: Secondary | ICD-10-CM

## 2014-11-26 DIAGNOSIS — Z1389 Encounter for screening for other disorder: Secondary | ICD-10-CM

## 2014-11-26 DIAGNOSIS — O98219 Gonorrhea complicating pregnancy, unspecified trimester: Secondary | ICD-10-CM | POA: Diagnosis not present

## 2014-11-26 DIAGNOSIS — Z3685 Encounter for antenatal screening for Streptococcus B: Secondary | ICD-10-CM

## 2014-11-26 DIAGNOSIS — R319 Hematuria, unspecified: Secondary | ICD-10-CM

## 2014-11-26 LAB — POCT URINALYSIS DIPSTICK
GLUCOSE UA: NEGATIVE
Ketones, UA: NEGATIVE
NITRITE UA: POSITIVE

## 2014-11-26 MED ORDER — CEFTRIAXONE SODIUM 250 MG IJ SOLR: 250.0000 mg | Freq: Once | INTRAMUSCULAR | Status: DC

## 2014-11-26 MED ORDER — METRONIDAZOLE 500 MG PO TABS: 500.0000 mg | ORAL_TABLET | Freq: Two times a day (BID) | ORAL | Status: AC

## 2014-11-26 MED ORDER — CEFTRIAXONE SODIUM 1 G IJ SOLR
250.0000 mg | Freq: Once | INTRAMUSCULAR | Status: AC
  Administered 2014-11-26: 250 mg via INTRAMUSCULAR

## 2014-11-26 MED ORDER — AZITHROMYCIN 500 MG PO TABS: 1000.0000 mg | ORAL_TABLET | Freq: Once | ORAL | Status: DC
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
## 2014-11-28 LAB — GC/CHLAMYDIA PROBE AMP
CHLAMYDIA, DNA PROBE: NEGATIVE
Neisseria gonorrhoeae by PCR: NEGATIVE

## 2014-11-28 LAB — URINE CULTURE

## 2014-11-28 NOTE — OR Nursing (Signed)
Called and left message for pt to call the OR.

## 2014-11-30 ENCOUNTER — Other Ambulatory Visit: Payer: Self-pay | Admitting: Obstetrics and Gynecology

## 2014-11-30 ENCOUNTER — Telehealth: Payer: Self-pay | Admitting: Obstetrics and Gynecology

## 2014-11-30 DIAGNOSIS — N390 Urinary tract infection, site not specified: Secondary | ICD-10-CM | POA: Insufficient documentation

## 2014-11-30 DIAGNOSIS — O2343 Unspecified infection of urinary tract in pregnancy, third trimester: Secondary | ICD-10-CM

## 2014-11-30 DIAGNOSIS — R319 Hematuria, unspecified: Secondary | ICD-10-CM

## 2014-11-30 LAB — CULTURE, BETA STREP (GROUP B ONLY): STREP GP B CULTURE: NEGATIVE

## 2014-11-30 MED ORDER — NITROFURANTOIN MONOHYD MACRO 100 MG PO CAPS: 100.0000 mg | ORAL_CAPSULE | Freq: Two times a day (BID) | ORAL | Status: DC

## 2014-11-30 NOTE — H&P (Signed)
Janet Dixon is a 29 y.o. female presenting for repeat Cesarean section on 12/02/14. History OB History    Gravida Para Term Preterm AB TAB SAB Ectopic Multiple Living   6 4 4  1  1   4      Past Medical History  Diagnosis Date  . History of gonorrhea   . History of trichomoniasis   . History of chlamydia   . Seizures    Past Surgical History  Procedure Laterality Date  . Cesarean section     Family History: family history includes Aneurysm in her maternal grandmother; Asthma in her brother, daughter, daughter, son, and son; Cancer in her maternal grandfather and maternal grandmother; Eczema in her daughter, daughter, maternal aunt, son, son, and son; Heart disease in her maternal grandmother; Hypertension in her mother. Social History:  reports that she has been smoking.  She has never used smokeless tobacco. She reports that she does not drink alcohol or use illicit drugs.   Prenatal Transfer Tool  Maternal Diabetes: No Genetic Screening: Normal Maternal Ultrasounds/Referrals: Normal Fetal Ultrasounds or other Referrals:  None Maternal Substance Abuse:  Yes:  Type: Cocaine, Other: was negative at last office testing Significant Maternal Medications:  Meds include: Other:  augmentin  For uti e coli Significant Maternal Lab Results:  Lab values include: Other: uti e coli Other Comments:  pos GC in Jan, negative last week GBS pos urine this pregnancy ROS Pt keeps  STI info secret from FOB.   Last menstrual period 03/05/2014. Exam Physical Exam  Constitutional: She appears well-developed and well-nourished.  HENT:  Head: Normocephalic.  Eyes: Pupils are equal, round, and reactive to light.  Cardiovascular: Normal rate.   No murmur heard. Respiratory: Effort normal and breath sounds normal.  GI: Soft.  Gravid uterus c/w dates  Genitourinary: Vagina normal.  Cervix closed. GC/ Chl /GBS collected and negative.     Prenatal labs: ABO, Rh:  o pos Antibody:  neg Rubella:    RPR: Non Reactive (01/13 1321)  HBsAg:    HIV:   neg 04/23/14 GBS:   Pos urine in thispreg,negative last wk  Assessment/Plan: Pregnancy 39 wk, Prior c/s x 3, for repeat NO desire for perm sterilization-- nexplanon Recent UTI e coli Rx augmentin 8/21   Plan repeat cesarean 8.23. 16  @10  am Vandana Haman V 11/30/2014, 4:28 PM

## 2014-11-30 NOTE — Telephone Encounter (Signed)
Left msg for pt to call office Re: Rx called in.

## 2014-12-02 ENCOUNTER — Inpatient Hospital Stay (HOSPITAL_COMMUNITY)
Admission: RE | Admit: 2014-12-02 | Discharge: 2014-12-04 | DRG: 775 | Disposition: A | Discharge status: Home / Self Care | Payer: Medicaid Other | Source: Ambulatory Visit | Attending: Obstetrics and Gynecology | Admitting: Obstetrics and Gynecology

## 2014-12-02 ENCOUNTER — Encounter (HOSPITAL_COMMUNITY)
Admission: RE | Disposition: A | Discharge status: Home / Self Care | Payer: Self-pay | Source: Ambulatory Visit | Attending: Obstetrics and Gynecology

## 2014-12-02 ENCOUNTER — Inpatient Hospital Stay (HOSPITAL_COMMUNITY): Payer: Medicaid Other | Admitting: Certified Registered Nurse Anesthetist

## 2014-12-02 ENCOUNTER — Encounter (HOSPITAL_COMMUNITY): Payer: Self-pay | Admitting: *Deleted

## 2014-12-02 DIAGNOSIS — F1910 Other psychoactive substance abuse, uncomplicated: Secondary | ICD-10-CM | POA: Diagnosis present

## 2014-12-02 DIAGNOSIS — O99324 Drug use complicating childbirth: Secondary | ICD-10-CM | POA: Diagnosis not present

## 2014-12-02 DIAGNOSIS — A549 Gonococcal infection, unspecified: Secondary | ICD-10-CM

## 2014-12-02 DIAGNOSIS — O2343 Unspecified infection of urinary tract in pregnancy, third trimester: Secondary | ICD-10-CM | POA: Diagnosis not present

## 2014-12-02 DIAGNOSIS — O99334 Smoking (tobacco) complicating childbirth: Secondary | ICD-10-CM | POA: Diagnosis present

## 2014-12-02 DIAGNOSIS — Z3493 Encounter for supervision of normal pregnancy, unspecified, third trimester: Secondary | ICD-10-CM

## 2014-12-02 DIAGNOSIS — R8271 Bacteriuria: Secondary | ICD-10-CM

## 2014-12-02 DIAGNOSIS — Z840 Family history of diseases of the skin and subcutaneous tissue: Secondary | ICD-10-CM | POA: Diagnosis not present

## 2014-12-02 DIAGNOSIS — Z809 Family history of malignant neoplasm, unspecified: Secondary | ICD-10-CM | POA: Diagnosis not present

## 2014-12-02 DIAGNOSIS — F1490 Cocaine use, unspecified, uncomplicated: Secondary | ICD-10-CM | POA: Diagnosis present

## 2014-12-02 DIAGNOSIS — B9620 Unspecified Escherichia coli [E. coli] as the cause of diseases classified elsewhere: Secondary | ICD-10-CM | POA: Diagnosis present

## 2014-12-02 DIAGNOSIS — F17210 Nicotine dependence, cigarettes, uncomplicated: Secondary | ICD-10-CM | POA: Diagnosis present

## 2014-12-02 DIAGNOSIS — Z8249 Family history of ischemic heart disease and other diseases of the circulatory system: Secondary | ICD-10-CM | POA: Diagnosis not present

## 2014-12-02 DIAGNOSIS — Z3A39 39 weeks gestation of pregnancy: Secondary | ICD-10-CM | POA: Diagnosis present

## 2014-12-02 DIAGNOSIS — Z87440 Personal history of urinary (tract) infections: Secondary | ICD-10-CM

## 2014-12-02 DIAGNOSIS — Z825 Family history of asthma and other chronic lower respiratory diseases: Secondary | ICD-10-CM

## 2014-12-02 DIAGNOSIS — O3421 Maternal care for scar from previous cesarean delivery: Principal | ICD-10-CM | POA: Diagnosis present

## 2014-12-02 DIAGNOSIS — O0933 Supervision of pregnancy with insufficient antenatal care, third trimester: Secondary | ICD-10-CM

## 2014-12-02 DIAGNOSIS — O99824 Streptococcus B carrier state complicating childbirth: Secondary | ICD-10-CM | POA: Diagnosis present

## 2014-12-02 DIAGNOSIS — A5901 Trichomonal vulvovaginitis: Secondary | ICD-10-CM

## 2014-12-02 LAB — CBC
HCT: 32.2 % — ABNORMAL LOW (ref 36.0–46.0)
HEMOGLOBIN: 10.8 g/dL — AB (ref 12.0–15.0)
MCH: 32.1 pg (ref 26.0–34.0)
MCHC: 33.5 g/dL (ref 30.0–36.0)
MCV: 95.8 fL (ref 78.0–100.0)
PLATELETS: 315 10*3/uL (ref 150–400)
RBC: 3.36 MIL/uL — AB (ref 3.87–5.11)
RDW: 13.6 % (ref 11.5–15.5)
WBC: 11.1 10*3/uL — AB (ref 4.0–10.5)

## 2014-12-02 LAB — TYPE AND SCREEN
ABO/RH(D): O POS
ANTIBODY SCREEN: NEGATIVE

## 2014-12-02 LAB — RPR: RPR: NONREACTIVE

## 2014-12-02 SURGERY — Surgical Case
Anesthesia: Spinal | Site: Abdomen

## 2014-12-02 MED ORDER — SENNOSIDES-DOCUSATE SODIUM 8.6-50 MG PO TABS
2.0000 | ORAL_TABLET | ORAL | Status: DC
  Administered 2014-12-02 – 2014-12-03 (×2): 2 via ORAL
  Filled 2014-12-02 (×2): qty 2

## 2014-12-02 MED ORDER — FENTANYL CITRATE (PF) 100 MCG/2ML IJ SOLN
INTRAMUSCULAR | Status: DC | PRN
  Administered 2014-12-02: 10 ug via INTRAVENOUS

## 2014-12-02 MED ORDER — SIMETHICONE 80 MG PO CHEW
80.0000 mg | CHEWABLE_TABLET | ORAL | Status: DC
  Administered 2014-12-02 – 2014-12-03 (×2): 80 mg via ORAL
  Filled 2014-12-02 (×2): qty 1

## 2014-12-02 MED ORDER — SIMETHICONE 80 MG PO CHEW
80.0000 mg | CHEWABLE_TABLET | Freq: Three times a day (TID) | ORAL | Status: DC
  Administered 2014-12-02 – 2014-12-04 (×6): 80 mg via ORAL
  Filled 2014-12-02 (×6): qty 1

## 2014-12-02 MED ORDER — MORPHINE SULFATE 0.5 MG/ML IJ SOLN
INTRAMUSCULAR | Status: AC
  Filled 2014-12-02: qty 100

## 2014-12-02 MED ORDER — OXYTOCIN 10 UNIT/ML IJ SOLN
40.0000 [IU] | INTRAMUSCULAR | Status: DC | PRN
  Administered 2014-12-02: 40 [IU] via INTRAVENOUS

## 2014-12-02 MED ORDER — FENTANYL CITRATE (PF) 100 MCG/2ML IJ SOLN
INTRAMUSCULAR | Status: AC
  Filled 2014-12-02: qty 4

## 2014-12-02 MED ORDER — SIMETHICONE 80 MG PO CHEW: 80.0000 mg | CHEWABLE_TABLET | ORAL | Status: DC | PRN

## 2014-12-02 MED ORDER — ONDANSETRON HCL 4 MG/2ML IJ SOLN
4.0000 mg | Freq: Three times a day (TID) | INTRAMUSCULAR | Status: DC | PRN
Start: 2014-12-02 — End: 2014-12-04

## 2014-12-02 MED ORDER — FENTANYL CITRATE (PF) 100 MCG/2ML IJ SOLN
25.0000 ug | INTRAMUSCULAR | Status: DC | PRN
  Administered 2014-12-02 (×2): 50 ug via INTRAVENOUS

## 2014-12-02 MED ORDER — WITCH HAZEL-GLYCERIN EX PADS: 1.0000 "application " | MEDICATED_PAD | CUTANEOUS | Status: DC | PRN

## 2014-12-02 MED ORDER — NALOXONE HCL 1 MG/ML IJ SOLN
1.0000 ug/kg/h | INTRAVENOUS | Status: DC | PRN
  Filled 2014-12-02: qty 2

## 2014-12-02 MED ORDER — BUPIVACAINE IN DEXTROSE 0.75-8.25 % IT SOLN
INTRATHECAL | Status: DC | PRN
  Administered 2014-12-02: 1.7 mL via INTRATHECAL

## 2014-12-02 MED ORDER — ACETAMINOPHEN 325 MG PO TABS
650.0000 mg | ORAL_TABLET | ORAL | Status: DC | PRN
  Administered 2014-12-02 (×2): 650 mg via ORAL
  Filled 2014-12-02 (×2): qty 2

## 2014-12-02 MED ORDER — CEFAZOLIN SODIUM-DEXTROSE 2-3 GM-% IV SOLR
2.0000 g | INTRAVENOUS | Status: AC
  Administered 2014-12-02: 2 g via INTRAVENOUS

## 2014-12-02 MED ORDER — NITROFURANTOIN MONOHYD MACRO 100 MG PO CAPS
100.0000 mg | ORAL_CAPSULE | Freq: Two times a day (BID) | ORAL | Status: DC
  Filled 2014-12-02 (×3): qty 1

## 2014-12-02 MED ORDER — PHENYLEPHRINE 8 MG IN D5W 100 ML (0.08MG/ML) PREMIX OPTIME
INJECTION | INTRAVENOUS | Status: AC
  Filled 2014-12-02: qty 100

## 2014-12-02 MED ORDER — SCOPOLAMINE 1 MG/3DAYS TD PT72
1.0000 | MEDICATED_PATCH | Freq: Once | TRANSDERMAL | Status: DC
  Administered 2014-12-02: 1.5 mg via TRANSDERMAL

## 2014-12-02 MED ORDER — OXYCODONE-ACETAMINOPHEN 5-325 MG PO TABS
1.0000 | ORAL_TABLET | ORAL | Status: DC | PRN
  Administered 2014-12-02 – 2014-12-03 (×2): 1 via ORAL
  Filled 2014-12-02 (×2): qty 1

## 2014-12-02 MED ORDER — PHENYLEPHRINE 8 MG IN D5W 100 ML (0.08MG/ML) PREMIX OPTIME
INJECTION | INTRAVENOUS | Status: DC | PRN
  Administered 2014-12-02: 30 ug/min via INTRAVENOUS

## 2014-12-02 MED ORDER — SCOPOLAMINE 1 MG/3DAYS TD PT72
MEDICATED_PATCH | TRANSDERMAL | Status: AC
  Administered 2014-12-02: 1.5 mg via TRANSDERMAL
  Filled 2014-12-02: qty 1

## 2014-12-02 MED ORDER — SCOPOLAMINE 1 MG/3DAYS TD PT72: 1.0000 | MEDICATED_PATCH | Freq: Once | TRANSDERMAL | Status: DC

## 2014-12-02 MED ORDER — FENTANYL CITRATE (PF) 100 MCG/2ML IJ SOLN
INTRAMUSCULAR | Status: AC
  Filled 2014-12-02: qty 2

## 2014-12-02 MED ORDER — CEFAZOLIN SODIUM-DEXTROSE 2-3 GM-% IV SOLR
INTRAVENOUS | Status: AC
  Filled 2014-12-02: qty 50

## 2014-12-02 MED ORDER — DIPHENHYDRAMINE HCL 25 MG PO CAPS: 25.0000 mg | ORAL_CAPSULE | Freq: Four times a day (QID) | ORAL | Status: DC | PRN

## 2014-12-02 MED ORDER — ONDANSETRON HCL 4 MG/2ML IJ SOLN
INTRAMUSCULAR | Status: DC | PRN
  Administered 2014-12-02: 4 mg via INTRAVENOUS

## 2014-12-02 MED ORDER — OXYTOCIN 10 UNIT/ML IJ SOLN
INTRAMUSCULAR | Status: AC
  Filled 2014-12-02: qty 4

## 2014-12-02 MED ORDER — OXYTOCIN 40 UNITS IN LACTATED RINGERS INFUSION - SIMPLE MED: 62.5000 mL/h | INTRAVENOUS | Status: AC

## 2014-12-02 MED ORDER — PRENATAL MULTIVITAMIN CH
1.0000 | ORAL_TABLET | Freq: Every day | ORAL | Status: DC
  Administered 2014-12-02 – 2014-12-04 (×3): 1 via ORAL
  Filled 2014-12-02 (×3): qty 1

## 2014-12-02 MED ORDER — IBUPROFEN 600 MG PO TABS
600.0000 mg | ORAL_TABLET | Freq: Four times a day (QID) | ORAL | Status: DC
  Administered 2014-12-04: 600 mg via ORAL
  Filled 2014-12-02 (×3): qty 1

## 2014-12-02 MED ORDER — DIBUCAINE 1 % RE OINT: 1.0000 "application " | TOPICAL_OINTMENT | RECTAL | Status: DC | PRN

## 2014-12-02 MED ORDER — BUPIVACAINE IN DEXTROSE 0.75-8.25 % IT SOLN
INTRATHECAL | Status: AC
  Filled 2014-12-02: qty 2

## 2014-12-02 MED ORDER — PNEUMOCOCCAL VAC POLYVALENT 25 MCG/0.5ML IJ INJ
0.5000 mL | INJECTION | INTRAMUSCULAR | Status: DC
  Filled 2014-12-02: qty 0.5

## 2014-12-02 MED ORDER — MEPERIDINE HCL 25 MG/ML IJ SOLN: 6.2500 mg | INTRAMUSCULAR | Status: DC | PRN

## 2014-12-02 MED ORDER — ONDANSETRON HCL 4 MG/2ML IJ SOLN: 4.0000 mg | Freq: Once | INTRAMUSCULAR | Status: DC | PRN

## 2014-12-02 MED ORDER — MORPHINE SULFATE (PF) 0.5 MG/ML IJ SOLN
INTRAMUSCULAR | Status: DC | PRN
  Administered 2014-12-02: .2 mg via EPIDURAL

## 2014-12-02 MED ORDER — MENTHOL 3 MG MT LOZG: 1.0000 | LOZENGE | OROMUCOSAL | Status: DC | PRN

## 2014-12-02 MED ORDER — ONDANSETRON HCL 4 MG/2ML IJ SOLN
INTRAMUSCULAR | Status: AC
  Filled 2014-12-02: qty 2

## 2014-12-02 MED ORDER — LACTATED RINGERS IV SOLN: INTRAVENOUS | Status: DC

## 2014-12-02 MED ORDER — OXYCODONE-ACETAMINOPHEN 5-325 MG PO TABS
2.0000 | ORAL_TABLET | ORAL | Status: DC | PRN
  Administered 2014-12-03 – 2014-12-04 (×5): 2 via ORAL
  Filled 2014-12-02 (×5): qty 2

## 2014-12-02 MED ORDER — LANOLIN HYDROUS EX OINT: 1.0000 "application " | TOPICAL_OINTMENT | CUTANEOUS | Status: DC | PRN

## 2014-12-02 MED ORDER — ZOLPIDEM TARTRATE 5 MG PO TABS: 5.0000 mg | ORAL_TABLET | Freq: Every evening | ORAL | Status: DC | PRN

## 2014-12-02 MED ORDER — DEXAMETHASONE SODIUM PHOSPHATE 4 MG/ML IJ SOLN
INTRAMUSCULAR | Status: DC | PRN
  Administered 2014-12-02: 4 mg via INTRAVENOUS

## 2014-12-02 MED ORDER — LACTATED RINGERS IV SOLN
Freq: Once | INTRAVENOUS | Status: AC
  Administered 2014-12-02 (×3): via INTRAVENOUS

## 2014-12-02 MED ORDER — ALBUTEROL SULFATE (2.5 MG/3ML) 0.083% IN NEBU: 3.0000 mL | INHALATION_SOLUTION | Freq: Four times a day (QID) | RESPIRATORY_TRACT | Status: DC | PRN

## 2014-12-02 MED ORDER — TETANUS-DIPHTH-ACELL PERTUSSIS 5-2.5-18.5 LF-MCG/0.5 IM SUSP
0.5000 mL | Freq: Once | INTRAMUSCULAR | Status: AC
  Administered 2014-12-04: 0.5 mL via INTRAMUSCULAR
  Filled 2014-12-02: qty 0.5

## 2014-12-02 SURGICAL SUPPLY — 34 items
APL SKNCLS STERI-STRIP NONHPOA (GAUZE/BANDAGES/DRESSINGS) ×1
BENZOIN TINCTURE PRP APPL 2/3 (GAUZE/BANDAGES/DRESSINGS) ×2 IMPLANT
CLAMP CORD UMBIL (MISCELLANEOUS) ×2 IMPLANT
CLOSURE WOUND 1/2 X4 (GAUZE/BANDAGES/DRESSINGS) ×1
CLOTH BEACON ORANGE TIMEOUT ST (SAFETY) ×3 IMPLANT
DRAPE SHEET LG 3/4 BI-LAMINATE (DRAPES) ×2 IMPLANT
DRSG OPSITE POSTOP 4X10 (GAUZE/BANDAGES/DRESSINGS) ×3 IMPLANT
DURAPREP 26ML APPLICATOR (WOUND CARE) ×3 IMPLANT
ELECT REM PT RETURN 9FT ADLT (ELECTROSURGICAL) ×3
ELECTRODE REM PT RTRN 9FT ADLT (ELECTROSURGICAL) ×1 IMPLANT
EXTRACTOR VACUUM KIWI (MISCELLANEOUS) ×2 IMPLANT
GLOVE BIO SURGEON ST LM GN SZ9 (GLOVE) ×3 IMPLANT
GLOVE BIOGEL PI IND STRL 9 (GLOVE) ×1 IMPLANT
GLOVE BIOGEL PI INDICATOR 9 (GLOVE) ×2
GOWN STRL REUS W/TWL 2XL LVL3 (GOWN DISPOSABLE) ×3 IMPLANT
GOWN STRL REUS W/TWL LRG LVL3 (GOWN DISPOSABLE) ×3 IMPLANT
NDL HYPO 25X5/8 SAFETYGLIDE (NEEDLE) IMPLANT
NEEDLE HYPO 25X5/8 SAFETYGLIDE (NEEDLE) IMPLANT
NS IRRIG 1000ML POUR BTL (IV SOLUTION) ×3 IMPLANT
PACK C SECTION WH (CUSTOM PROCEDURE TRAY) ×3 IMPLANT
PAD OB MATERNITY 4.3X12.25 (PERSONAL CARE ITEMS) ×3 IMPLANT
RTRCTR C-SECT PINK 25CM LRG (MISCELLANEOUS) IMPLANT
RTRCTR C-SECT PINK 34CM XLRG (MISCELLANEOUS) IMPLANT
STRIP CLOSURE SKIN 1/2X4 (GAUZE/BANDAGES/DRESSINGS) ×1 IMPLANT
SUT MNCRL 0 VIOLET CTX 36 (SUTURE) ×2 IMPLANT
SUT MONOCRYL 0 CTX 36 (SUTURE) ×4
SUT VIC AB 0 CT1 27 (SUTURE) ×3
SUT VIC AB 0 CT1 27XBRD ANBCTR (SUTURE) ×1 IMPLANT
SUT VIC AB 2-0 CT1 27 (SUTURE) ×3
SUT VIC AB 2-0 CT1 TAPERPNT 27 (SUTURE) ×1 IMPLANT
SUT VIC AB 4-0 KS 27 (SUTURE) ×3 IMPLANT
SYR BULB IRRIGATION 50ML (SYRINGE) IMPLANT
TOWEL OR 17X24 6PK STRL BLUE (TOWEL DISPOSABLE) ×3 IMPLANT
TRAY FOLEY CATH SILVER 14FR (SET/KITS/TRAYS/PACK) ×3 IMPLANT

## 2014-12-02 NOTE — Anesthesia Procedure Notes (Signed)
Epidural Patient location during procedure: OB  Staffing Anesthesiologist: Keylen Uzelac Performed by: anesthesiologist   Preanesthetic Checklist Completed: patient identified, site marked, surgical consent, pre-op evaluation, timeout performed, IV checked, risks and benefits discussed and monitors and equipment checked  Epidural Patient position: sitting Prep: site prepped and draped and DuraPrep Patient monitoring: continuous pulse ox and blood pressure Approach: midline Location: L3-L4 Injection technique: LOR saline  Needle:  Needle type: Tuohy  Needle gauge: 17 G Needle length: 9 cm and 9 Needle insertion depth: 5 cm cm Catheter type: closed end flexible Catheter size: 19 Gauge Catheter at skin depth: 10 cm Test dose: negative  Assessment Events: blood not aspirated, injection not painful, no injection resistance, negative IV test and no paresthesia  Additional Notes Patient identified. Risks/Benefits/Options discussed with patient including but not limited to bleeding, infection, nerve damage, paralysis, failed block, incomplete pain control, headache, blood pressure changes, nausea, vomiting, reactions to medication both or allergic, itching and postpartum back pain. Confirmed with bedside nurse the patient's most recent platelet count. Confirmed with patient that they are not currently taking any anticoagulation, have any bleeding history or any family history of bleeding disorders. Patient expressed understanding and wished to proceed. All questions were answered. Sterile technique was used throughout the entire procedure. Please see nursing notes for vital signs. Test dose was given through epidural catheter and negative prior to continuing to dose epidural or start infusion. Warning signs of high block given to the patient including shortness of breath, tingling/numbness in hands, complete motor block, or any concerning symptoms with instructions to call for help. Patient was  given instructions on fall risk and not to get out of bed. All questions and concerns addressed with instructions to call with any issues or inadequate analgesia.

## 2014-12-02 NOTE — Op Note (Signed)
12/02/2014  12:00 PM  PATIENT:  Janet Dixon  29 y.o. female  PRE-OPERATIVE DIAGNOSIS:  REPEAT CESAREAN SECTION pregnancy 39 weeks prior cesarean section 5  POST-OPERATIVE DIAGNOSIS:  previous cesarean section pregnancy 39 weeks prior cesarean section 5 delivered  PROCEDURE:  Procedure(s): REPEAT CESAREAN SECTION (N/A)  SURGEON:  Surgeon(s) and Role:    * Jonnie Kind, MD - Primary    * Gwynne Edinger, MD - Fellow  PHYSICIAN ASSISTANT:   ASSISTANTS: none   ANESTHESIA:   spinal  EBL:  Total I/O In: 3000 [I.V.:3000] Out: 900 [Urine:300; Blood:600]  BLOOD ADMINISTERED:none  DRAINS: Urinary Catheter (Foley)   LOCAL MEDICATIONS USED:  NONE  SPECIMEN:  No Specimen  DISPOSITION OF SPECIMEN:  PATHOLOGY  COUNTS:  YES  TOURNIQUET:  * No tourniquets in log *  DICTATION: .Dragon Dictation  PLAN OF CARE: Admit to inpatient   PATIENT DISPOSITION:  PACU - hemodynamically stable.   Delay start of Pharmacological VTE agent (>24hrs) due to surgical blood loss or risk of bleeding: not applicable Details of procedure: Patient was taken operating room prepped and draped for lower abdominal surgery. Incision was introduced. Ancef was administered. Timeout was conducted. Transverse lower abdominal incision was made without any excision of the old cicatrix. There was fibrous tissue in the fatty tissue and the fascia was opened transversely and dissected off the rectus muscles with some difficulty due to adhesions perineal cavity couldn't be entered high up on the abdominal wall and surprisingly there were essentially minimal adhesions to the uterus and none to the anterior abdominal wall. Care was taken to dissect the bladder free and pulled inferior and then a transverse uterine incision made through a very thin lower uterine segment and the fetal vertex rotated into the incision and delivered with Kiwi vacuum extraction guidance and fundal pressure. Cord was clamped and the infant  passed to waiting pediatricians. 90 seconds were taken prior to clamping of the cord. Placenta delivered by Cred uterine massage and IV oxytocin. Membranes were extracted carefully from the lower uterine segment. Uterus was closed with a single layer running locking 0 Monocryl sewing from each corner to the midline second layer was not placed as hemostasis achieved, the layer was very hemostatic and thin, and the bladder was quite close. Abdomen was irrigated. [name] closed with running 2-0 Vicryl, the fascia closed with a running 0 Vicryl, subcutaneous tissues sewed with 2 interrupted sutures of horizontal mattress 2-0 Vicryl, followed by subcuticular 4-0 Vicryl skin closure. Sponge and needle counts correct EBL 600 cc

## 2014-12-02 NOTE — Anesthesia Preprocedure Evaluation (Signed)
Anesthesia Evaluation  Patient identified by MRN, date of birth, ID band Patient awake    Reviewed: Allergy & Precautions, NPO status , Patient's Chart, lab work & pertinent test results  History of Anesthesia Complications Negative for: history of anesthetic complications  Airway Mallampati: II  TM Distance: >3 FB Neck ROM: Full    Dental no notable dental hx. (+) Dental Advisory Given   Pulmonary Current Smoker,  breath sounds clear to auscultation  Pulmonary exam normal       Cardiovascular negative cardio ROS Normal cardiovascular examRhythm:Regular Rate:Normal     Neuro/Psych Seizures -,  negative psych ROS   GI/Hepatic negative GI ROS, (+)     substance abuse  cocaine use,   Endo/Other  negative endocrine ROS  Renal/GU negative Renal ROS  negative genitourinary   Musculoskeletal negative musculoskeletal ROS (+)   Abdominal   Peds negative pediatric ROS (+)  Hematology negative hematology ROS (+)   Anesthesia Other Findings   Reproductive/Obstetrics (+) Pregnancy Hx of 5 prior C/S, no transfusion hx, posterior placenta                             Anesthesia Physical Anesthesia Plan  ASA: III  Anesthesia Plan: Combined Spinal and Epidural   Post-op Pain Management:    Induction:   Airway Management Planned:   Additional Equipment:   Intra-op Plan:   Post-operative Plan:   Informed Consent: I have reviewed the patients History and Physical, chart, labs and discussed the procedure including the risks, benefits and alternatives for the proposed anesthesia with the patient or authorized representative who has indicated his/her understanding and acceptance.   Dental advisory given  Plan Discussed with: CRNA  Anesthesia Plan Comments:         Anesthesia Quick Evaluation

## 2014-12-02 NOTE — Interval H&P Note (Signed)
History and Physical Interval Note:  12/02/2014 10:15 AM  Janet Dixon  has presented today for surgery, with the diagnosis of REPEAT CESAREAN SECTION  The various methods of treatment have been discussed with the patient and family. After consideration of risks, benefits and other options for treatment, the patient has consented to  Procedure(s): REPEAT CESAREAN SECTION (N/A) as a surgical intervention .  The patient's history has been reviewed, patient examined, no change in status, stable for surgery.  I have reviewed the patient's chart and labs.  Questions were answered to the patient's satisfaction.  The patient has a single IV site, functioning well.    Janet Dixon

## 2014-12-02 NOTE — Transfer of Care (Signed)
Immediate Anesthesia Transfer of Care Note  Patient: Janet Dixon  Procedure(s) Performed: Procedure(s): REPEAT CESAREAN SECTION (N/A)  Patient Location: PACU  Anesthesia Type:Spinal  Level of Consciousness: awake, alert  and oriented  Airway & Oxygen Therapy: Patient Spontanous Breathing  Post-op Assessment: Report given to RN and Post -op Vital signs reviewed and stable  Post vital signs: Reviewed and stable  Last Vitals:  Filed Vitals:   12/02/14 0843  BP: 137/80  Pulse: 94  Temp: 36.6 C  Resp: 18    Complications: No apparent anesthesia complications

## 2014-12-02 NOTE — H&P (View-Only) (Signed)
Janet Dixon is a 29 y.o. female presenting for repeat Cesarean section on 12/02/14. History OB History    Gravida Para Term Preterm AB TAB SAB Ectopic Multiple Living   6 4 4  1  1   4      Past Medical History  Diagnosis Date  . History of gonorrhea   . History of trichomoniasis   . History of chlamydia   . Seizures    Past Surgical History  Procedure Laterality Date  . Cesarean section     Family History: family history includes Aneurysm in her maternal grandmother; Asthma in her brother, daughter, daughter, son, and son; Cancer in her maternal grandfather and maternal grandmother; Eczema in her daughter, daughter, maternal aunt, son, son, and son; Heart disease in her maternal grandmother; Hypertension in her mother. Social History:  reports that she has been smoking.  She has never used smokeless tobacco. She reports that she does not drink alcohol or use illicit drugs.   Prenatal Transfer Tool  Maternal Diabetes: No Genetic Screening: Normal Maternal Ultrasounds/Referrals: Normal Fetal Ultrasounds or other Referrals:  None Maternal Substance Abuse:  Yes:  Type: Cocaine, Other: was negative at last office testing Significant Maternal Medications:  Meds include: Other:  augmentin  For uti e coli Significant Maternal Lab Results:  Lab values include: Other: uti e coli Other Comments:  pos GC in Jan, negative last week GBS pos urine this pregnancy ROS Pt keeps  STI info secret from FOB.   Last menstrual period 03/05/2014. Exam Physical Exam  Constitutional: She appears well-developed and well-nourished.  HENT:  Head: Normocephalic.  Eyes: Pupils are equal, round, and reactive to light.  Cardiovascular: Normal rate.   No murmur heard. Respiratory: Effort normal and breath sounds normal.  GI: Soft.  Gravid uterus c/w dates  Genitourinary: Vagina normal.  Cervix closed. GC/ Chl /GBS collected and negative.     Prenatal labs: ABO, Rh:  o pos Antibody:  neg Rubella:    RPR: Non Reactive (01/13 1321)  HBsAg:    HIV:   neg 04/23/14 GBS:   Pos urine in thispreg,negative last wk  Assessment/Plan: Pregnancy 39 wk, Prior c/s x 3, for repeat NO desire for perm sterilization-- nexplanon Recent UTI e coli Rx augmentin 8/21   Plan repeat cesarean 8.23. 16  @10  am Ilai Hiller V 11/30/2014, 4:28 PM

## 2014-12-02 NOTE — Consult Note (Signed)
Neonatology Note:   Attendance at C-section:    I was asked by Dr. Glo Herring to attend this repeat C/S at term. The mother is a G6P4A1 O pos, GBS negative (most recently, but positive earlier in pregnancy) with cocaine use during pregnancy and recent E. Coli UTI, treated with Augmentin. ROM at delivery, fluid clear. Vacuum-assisted delivery. Delayed cord clamping done. Infant vigorous with good spontaneous cry and tone. Needed bulb suctioning several times for small amounts of clear secretions. Ap 8/9. Lungs clear to ausc in DR. To CN to care of Pediatrician.   Real Cons, MD

## 2014-12-02 NOTE — Brief Op Note (Signed)
12/02/2014  12:00 PM  PATIENT:  Janet Dixon  29 y.o. female  PRE-OPERATIVE DIAGNOSIS:  REPEAT CESAREAN SECTION pregnancy 39 weeks prior cesarean section 5  POST-OPERATIVE DIAGNOSIS:  previous cesarean section pregnancy 39 weeks prior cesarean section 5 delivered  PROCEDURE:  Procedure(s): REPEAT CESAREAN SECTION (N/A)  SURGEON:  Surgeon(s) and Role:    * Jonnie Kind, MD - Primary    * Gwynne Edinger, MD - Fellow  PHYSICIAN ASSISTANT:   ASSISTANTS: none   ANESTHESIA:   spinal  EBL:  Total I/O In: 3000 [I.V.:3000] Out: 900 [Urine:300; Blood:600]  BLOOD ADMINISTERED:none  DRAINS: Urinary Catheter (Foley)   LOCAL MEDICATIONS USED:  NONE  SPECIMEN:  No Specimen  DISPOSITION OF SPECIMEN:  PATHOLOGY  COUNTS:  YES  TOURNIQUET:  * No tourniquets in log *  DICTATION: .Dragon Dictation  PLAN OF CARE: Admit to inpatient   PATIENT DISPOSITION:  PACU - hemodynamically stable.   Delay start of Pharmacological VTE agent (>24hrs) due to surgical blood loss or risk of bleeding: not applicable

## 2014-12-02 NOTE — Anesthesia Postprocedure Evaluation (Signed)
  Anesthesia Post-op Note  Patient: Janet Dixon  Procedure(s) Performed: Procedure(s) (LRB): REPEAT CESAREAN SECTION (N/A)  Patient Location: PACU  Anesthesia Type: Spinal  Level of Consciousness: awake and alert   Airway and Oxygen Therapy: Patient Spontanous Breathing  Post-op Pain: mild  Post-op Assessment: Post-op Vital signs reviewed, Patient's Cardiovascular Status Stable, Respiratory Function Stable, Patent Airway and No signs of Nausea or vomiting  Last Vitals:  Filed Vitals:   12/02/14 1230  BP: 124/78  Pulse: 64  Temp:   Resp: 21    Post-op Vital Signs: stable   Complications: No apparent anesthesia complications

## 2014-12-03 ENCOUNTER — Encounter (HOSPITAL_COMMUNITY): Payer: Self-pay | Admitting: Obstetrics and Gynecology

## 2014-12-03 LAB — CBC
HEMATOCRIT: 26.4 % — AB (ref 36.0–46.0)
Hemoglobin: 9 g/dL — ABNORMAL LOW (ref 12.0–15.0)
MCH: 32.4 pg (ref 26.0–34.0)
MCHC: 34.1 g/dL (ref 30.0–36.0)
MCV: 95 fL (ref 78.0–100.0)
PLATELETS: 273 10*3/uL (ref 150–400)
RBC: 2.78 MIL/uL — ABNORMAL LOW (ref 3.87–5.11)
RDW: 13.3 % (ref 11.5–15.5)
WBC: 17 10*3/uL — ABNORMAL HIGH (ref 4.0–10.5)

## 2014-12-03 LAB — HEPATITIS B SURFACE ANTIGEN: Hepatitis B Surface Ag: NEGATIVE

## 2014-12-03 NOTE — Progress Notes (Signed)
POSTPARTUM PROGRESS NOTE  Post Partum Day 1  Subjective:  Janet Dixon is a 28 y.o. [id number] 36w0ds/p rLTCS.  No acute events overnight.  Pt denies problems with ambulating, voiding or po intake.  She denies nausea or vomiting.  Pain is moderately controlled.  She has had flatus. She has not had bowel movement.  Lochia Small.    Objective: Blood pressure 108/61, pulse 61, temperature 98.4 F (36.9 C), temperature source Oral, resp. rate 18, last menstrual period 03/05/2014, SpO2 99 %, unknown if currently breastfeeding.  Physical Exam:  General: alert, cooperative and no distress Lochia:normal flow Chest: CTAB Heart: RRR no m/r/g Abdomen: +BS, soft, nontender,  Uterine Fundus: firm, incision with moderate drainage not soaking fully through honeycomb dressing, intact DVT Evaluation: No evidence of DVT seen on physical exam. Extremities: trace edema   Recent Labs  12/02/14 0835 12/03/14 0536  HGB 10.8* 9.0*  HCT 32.2* 26.4*    Assessment/Plan:  ASSESSMENT: Janet WIESENis a 29y.o. [id number]/p rLTCS, POD#1. H trended 10.8 to 9. Doing well.  Plan for d/c tomorrow or next day. F/u hep b and UDS and SW consult (hx cocaine use)   LOS: 1 day   NoDesma Maxim/24/2016, 6:48 AM

## 2014-12-03 NOTE — Anesthesia Postprocedure Evaluation (Signed)
  Anesthesia Post-op Note  Patient: Janet Dixon  Procedure(s) Performed: Procedure(s): REPEAT CESAREAN SECTION (N/A)  Patient Location: Mother/Baby  Anesthesia Type:Regional  Level of Consciousness: awake, alert , oriented and patient cooperative  Airway and Oxygen Therapy: Patient Spontanous Breathing  Post-op Pain: mild  Post-op Assessment: Patient's Cardiovascular Status Stable and Respiratory Function Stable full sensation bil in feet and legs;ambulating without difficulty  Post-op Vital Signs: Reviewed and stable  Last Vitals:  Filed Vitals:   12/03/14 0325  BP: 108/61  Pulse: 61  Temp: 36.9 C  Resp: 18    Complications: No apparent anesthesia complications

## 2014-12-03 NOTE — Addendum Note (Signed)
Addendum  created 12/03/14 1497 by Georgeanne Nim, CRNA   Modules edited: Notes Section   Notes Section:  File: [id number]

## 2014-12-03 NOTE — Clinical Social Work Maternal (Signed)
CLINICAL SOCIAL WORK MATERNAL/CHILD NOTE  Patient Details  Name: Janet Dixon MRN: [id number] Date of Birth: 25-Mar-1986  Date:  12/03/2014  Clinical Social Worker Initiating Note:  Lucita Ferrara, LCSW Date/ Time Initiated:  12/03/14/0830     Child's Name:  Johny Shock   Legal Guardian:  Hoyle Sauer and Mr. Joycelyn Man  Need for Interpreter:  None   Date of Referral:  12/02/14     Reason for Referral:  Late or No Prenatal Care , Current Substance Use/Substance Use During Pregnancy    Referral Source:  [facility]   Address:  [address] [place], [place]   Phone number:  [phone]   Household Members:  Significant Other   Natural Supports (not living in the home):  Immediate Family, Extended Family, Children, Friends, Tax adviser: None   Employment: Unemployed (FOB is employed)   Type of Work:   N/A  Education:    N/A  Pensions consultant:  Medicaid   Other Resources:  [facility]   Cultural/Religious Considerations Which May Impact Care:  None reported  Strengths:  Ability to meet basic needs , Pediatrician chosen , Home prepared for child    Risk Factors/Current Problems:   1) No physical custody of her other children. Per [name], she chose for her children to live with various family members. She denied presence of CPS when living arrangements were made, and stated that she continues to have parental rights. [name] reported desire to parent this infant.  2)Substance Use : [name] presents with history of cocaine and THC use during this pregnancy. [name] presented with a +UDS for cocaine in December 2015.  [name] had limited prenatal care and was unable to provide urine samples during the prenatal appointments. Infant's UDS is negative and MDS is pending.   Cognitive State:  Able to Concentrate , Alert , Goal Oriented , Linear Thinking    Mood/Affect:  Bright , Happy , Interested , Animated   CSW Assessment:  CSW received request for consult due to history  of cocaine use during the pregnancy and late/limited prenatal care. [name] presented as easily engaged and receptive to the [place] visit. She presented in a pleasant mood and displayed a full range in affect. [name] was alone for majority of the assessment, FOB joined toward the end.  [name] presented as optimistic and positive, and was noted to be caring for and attending to the infant's needs during the assessment. [name]'s comments and presentation highlighted motivation and desire to parent this infant.   [name] denied questions, concerns, or needs as she transitions postpartum. She stated that despite it being a "difficult" pregnancy, everything has "fallen into place".  She stated that early in the pregnancy, she and the FOB did not have stable housing. [name] shared that they were moving from "place to place", but discussed that 4 months ago, they were approved a lease for an apartment. Since that time, they have been living in their own residence with the FOB's mother.  She stated that due to unstable housing it made it difficult to attend prenatal appointments. She shared that she is familiar with Medicaid transportation, but discussed that since she was "always moving" and did not have a consistent cell phone number, it made it difficult for her to arrange for transportation. [name] stated that she was overwhelmed with "everything going on", but stated that she is feeling "better" now that she has stable housing and has access to transportation. [name] expressed goal of securing employment postpartum, but  stated that the FOB working and endorsed additional family support that has provided assurance that the home is prepared for the infant.  [name] denied history of depression, anxiety, or perinatal mood and anxiety disorders. [name] presented as attentive and engaged during the education, and agreed to contact her medical provider if she notes onset of symptoms.  CSW inquired about history of substance use that is listed in her  chart. [name] was a limited historian, and is at risk for minimizing use.  [name] reported that she was "experimenting" with cocaine until she learned of the pregnancy. She stated that once she learned that she was pregnant, she no longer used cocaine.  [name] denied belief that it was difficult to cease use since she had limited prior use and did not have an addiction. [name] endorse occasional THC use to assist with nausea ,and reported last use 2-3 weeks ago. [name] verbalized understanding of the hospital drug screen policy, and is aware that CPS will be contacted if there is a positive drug screen.   CSW also inquired about [name]'s other children.  [name] reported that she does not have physical custody of her other children. She stated that she was young when she had her first child (in 2005), and reported that various family members and friends have custody of all of her other children.  [name] denied any specific events that led to her making this custody agreements, but strongly denied presence of CPS when these decisions were made. [name] shared that she continues to have regular contact with her children, and stated that she hopes to have "some of them live with me" now that she has established housing.  [name] shared that she continues to have parental rights/legal custody of her children.    [name] denied questions, concerns, or needs at this time. She expressed appreciation for the visit, and agreed to contact CSW if needs arise during the admission.   CSW Plan/Description:   1)Patient/Family Education: Perinatal mood and anxiety disorders, hospital drug screen policy  2)CSW to monitor infant's toxicology screens and will make CPS report if positive.  3)Psychosocial Support and Ongoing Assessment of Needs: CSW to continue to follow during the admission. CSW to contact [place] CPS on 8/25 in order to inquire about any potential open/current cases.    Sheilah Mins, LCSW 12/03/2014, 5:24 PM

## 2014-12-04 ENCOUNTER — Encounter: Payer: Self-pay | Admitting: *Deleted

## 2014-12-04 LAB — RUBELLA SCREEN: Rubella: 2.89 index (ref >0.99)

## 2014-12-04 MED ORDER — IBUPROFEN 600 MG PO TABS: 600.0000 mg | ORAL_TABLET | Freq: Four times a day (QID) | ORAL | Status: DC | PRN

## 2014-12-04 MED ORDER — OXYCODONE-ACETAMINOPHEN 5-325 MG PO TABS: 1.0000 | ORAL_TABLET | ORAL | Status: DC | PRN

## 2014-12-04 NOTE — Discharge Instructions (Signed)

## 2014-12-04 NOTE — Progress Notes (Signed)
CSW made CPS report in [facility] due to concerns related to substance use early in pregnancy and due to [name] not having custody of her other children.   B.Webster is the assigned CPS worker, and is in route to the hospital to meet with [name] and FOB prior to infant's discharge.  CSW met with [name] and FOB and informed them of CPS report made and CPS intentions to meet with them at the hospital.  [name] asked questions about what to anticipate and expect.  FOB requested that CSW leave, and presented as defensive.  CSW inquired about additional questions, concerns, or needs, family denied questions or concerns.  CSW will continue to follow and will remain in contact with CPS worker to receive discharge recommendations.

## 2014-12-04 NOTE — Progress Notes (Signed)
CSW met with B.Webster, CPS worker from [place].  CPS completed home visit and met with [name], FOB, and PGM.  CPS identified no barriers to discharge.  CPS reported that they will continue to closely follow, make service recommendations, and the PGM has agreed to provide additional support/superivison at home.

## 2014-12-04 NOTE — Discharge Summary (Signed)
Obstetric Discharge Summary Reason for Admission: cesarean section Prenatal Procedures: ultrasound Intrapartum Procedures: cesarean: low cervical, transverse Postpartum Procedures: none Complications-Operative and Postpartum: none  PRE-OPERATIVE DIAGNOSIS: REPEAT CESAREAN SECTION pregnancy 39 weeks prior cesarean section 5  POST-OPERATIVE DIAGNOSIS: previous cesarean section pregnancy 39 weeks prior cesarean section 5 delivered  PROCEDURE: Procedure(s): REPEAT CESAREAN SECTION (N/A)  SURGEON: Surgeon(s) and Role:  * Jonnie Kind, MD - Primary  * Gwynne Edinger, MD - Fellow  ASSISTANTS: none   ANESTHESIA: spinal  EBL: Total I/O In: 3000 [I.V.:3000] Out: 900 [Urine:300; Blood:600]  BLOOD ADMINISTERED:none  DRAINS: Urinary Catheter (Foley)   LOCAL MEDICATIONS USED: NONE  SPECIMEN: No Specimen  DISPOSITION OF SPECIMEN: PATHOLOGY  COUNTS: YES  TOURNIQUET: * No tourniquets in log *  DICTATION: .Dragon Dictation  PLAN OF CARE: Admit to inpatient   PATIENT DISPOSITION: PACU - hemodynamically stable.  Delay start of Pharmacological VTE agent (>24hrs) due to surgical blood loss or risk of bleeding: not applicable Details of procedure: Patient was taken operating room prepped and draped for lower abdominal surgery. Incision was introduced. Ancef was administered. Timeout was conducted. Transverse lower abdominal incision was made without any excision of the old cicatrix. There was fibrous tissue in the fatty tissue and the fascia was opened transversely and dissected off the rectus muscles with some difficulty due to adhesions perineal cavity couldn't be entered high up on the abdominal wall and surprisingly there were essentially minimal adhesions to the uterus and none to the anterior abdominal wall. Care was taken to dissect the bladder free and pulled inferior and then a transverse uterine incision made through a very thin lower uterine segment and  the fetal vertex rotated into the incision and delivered with Kiwi vacuum extraction guidance and fundal pressure. Cord was clamped and the infant passed to waiting pediatricians. 90 seconds were taken prior to clamping of the cord. Placenta delivered by Cred uterine massage and IV oxytocin. Membranes were extracted carefully from the lower uterine segment. Uterus was closed with a single layer running locking 0 Monocryl sewing from each corner to the midline second layer was not placed as hemostasis achieved, the layer was very hemostatic and thin, and the bladder was quite close. Abdomen was irrigated. [name] closed with running 2-0 Vicryl, the fascia closed with a running 0 Vicryl, subcutaneous tissues sewed with 2 interrupted sutures of horizontal mattress 2-0 Vicryl, followed by subcuticular 4-0 Vicryl skin closure. Sponge and needle counts correct EBL 600 cc  Hospital Course:  Active Problems:   Status post cesarean section   Janet Dixon is a 29 y.o. [id number] s/p rLTCS.  Patient was admitted for repeat Cesarean section.  She has postpartum course that was uncomplicated including no problems with ambulating, PO intake, urination, pain, or bleeding. The pt feels ready to go home and  will be discharged with outpatient follow-up.   Today: No acute events overnight.  Pt denies problems with ambulating, voiding or po intake.  She denies nausea or vomiting.  Pain is well controlled.  She has had flatus. She has not had bowel movement.  Lochia Small.  Plan for birth control is  Depo-Provera.  Method of Feeding: Breast  Physical Exam:  General: alert, cooperative and no distress Lochia: appropriate Uterine Fundus: firm Incision: healing well, no significant drainage, no significant erythema DVT Evaluation: No evidence of DVT seen on physical exam.  H/H: Lab Results  Component Value Date/Time   HGB 9.0* 12/03/2014 05:36 AM   HGB 14.4  08/14/2012 12:42 PM   HCT 26.4* 12/03/2014 05:36 AM    HCT 42.7 08/14/2012 12:42 PM    Discharge Diagnoses: Term Pregnancy-delivered  Discharge Information: Date: 12/04/2014 Activity: pelvic rest Diet: routine  Medications: Ibuprofen and Percocet Breast feeding:  Yes Condition: stable Discharge to: home      Medication List    STOP taking these medications        metroNIDAZOLE 500 MG tablet  Commonly known as:  FLAGYL      TAKE these medications        acetaminophen 500 MG tablet  Commonly known as:  TYLENOL  Take 1,000 mg by mouth every 6 (six) hours as needed for moderate pain.     albuterol 108 (90 BASE) MCG/ACT inhaler  Commonly known as:  PROVENTIL HFA;VENTOLIN HFA  Inhale 2 puffs into the lungs every 6 (six) hours as needed for wheezing or shortness of breath.     ibuprofen 600 MG tablet  Commonly known as:  ADVIL,MOTRIN  Take 1 tablet (600 mg total) by mouth every 6 (six) hours as needed for moderate pain.     oxyCODONE-acetaminophen 5-325 MG per tablet  Commonly known as:  PERCOCET/ROXICET  Take 1 tablet by mouth every 4 (four) hours as needed for severe pain (for pain scale 4-7).     prenatal multivitamin Tabs tablet  Take 1 tablet by mouth daily at 12 noon.         Adin Hector ,MD PGY-1 [place]  12/04/2014,8:18 AM  CNM attestation I have seen and examined this patient and agree with above documentation in the resident's note.   Janet Dixon is a 29 y.o. [phone] s/p rLTCS.   Pain is well controlled.  Plan for birth control is Depo-Provera.  Method of Feeding: bottle  PE:  BP 107/62 mmHg  Pulse 79  Temp(Src) 98.9 F (37.2 C) (Oral)  Resp 17  SpO2 100%  LMP 03/05/2014  Breastfeeding? Unknown Fundus firm   Recent Labs  12/02/14 0835 12/03/14 0536  HGB 10.8* 9.0*  HCT 32.2* 26.4*     Plan: discharge today - postpartum care discussed - f/u clinic in 6 weeks for postpartum visit   Serita Grammes, CNM 9:18 AM 12/04/2014

## 2014-12-04 NOTE — Progress Notes (Signed)
Mom discharged, but will be staying in hospital with baby as baby is now a baby patient. Discharge instructions reviewed with patient and she verbalized understanding.Marland KitchenMarland Kitchen

## 2014-12-05 ENCOUNTER — Ambulatory Visit: Payer: Self-pay

## 2014-12-05 ENCOUNTER — Telehealth: Payer: Self-pay | Admitting: Obstetrics & Gynecology

## 2014-12-05 MED ORDER — OXYCODONE-ACETAMINOPHEN 5-325 MG PO TABS: 1.0000 | ORAL_TABLET | Freq: Four times a day (QID) | ORAL | Status: DC | PRN

## 2014-12-05 MED ORDER — PRENATAL MULTIVITAMIN CH: 1.0000 | ORAL_TABLET | Freq: Every day | ORAL | Status: DC

## 2014-12-05 NOTE — Telephone Encounter (Signed)
Pt states that she left her Rx for her pain medication and her prenatal vits.

## 2014-12-05 NOTE — Telephone Encounter (Signed)
I spoke with the pharmacy and called in refill on PNV, and Dr. Glo Herring gave a verbal order for #30 percocet 1 po every 6 hrs for pain with no refills. Pt aware to come by and pick Rx for pain medication up.

## 2014-12-05 NOTE — Lactation Note (Signed)
This note was copied from the chart of Janet Caedyn Tassinari. Lactation Consultation Note; Experienced BF mom. History of drug use during pregnancy. Reports baby is latching well most of the time- nipples are a little tender- small blister noted on tips of both nipples. Encouraged to change positions throughout the day  Comfort gels given with instructions for use. Mom reports breasts are feeling much heavier this morning. Has manual pump in room- reviewed use and cleaning of pump parts and milk storage, Able to hand express whitish milk. Reports that baby fed about 45 min ago she is asleep at mom's side. Mom asking about caffeine intake- reviewed that everything she ingests will go to baby in breast milk BF brochure given with resources for support after DC. No further questions at present. To call prn  Patient Name: Janet Dixon [id number] Date: 12/05/2014 Reason for consult: Initial assessment   Maternal Data Formula Feeding for Exclusion: Yes Reason for exclusion: Mother's choice to formula and breast feed on admission;Substance abuse and/or alcohol abuse Has patient been taught Hand Expression?: Yes Does the patient have breastfeeding experience prior to this delivery?: Yes  Feeding Feeding Type: Bottle Fed - Breast Milk Length of feed: 15 min  LATCH Score/Interventions       Type of Nipple: Everted at rest and after stimulation  Comfort (Breast/Nipple): Filling, red/small blisters or bruises, mild/mod discomfort  Problem noted: Filling Interventions (Mild/moderate discomfort): Hand expression;Comfort gels        Lactation Tools Discussed/Used WIC Program: Yes Pump Review: Setup, frequency, and cleaning;Milk Storage Initiated by:: RN Date initiated:: 12/04/14   Consult Status Consult Status: Complete    Truddie Crumble 12/05/2014, 10:13 AM

## 2014-12-08 ENCOUNTER — Ambulatory Visit (INDEPENDENT_AMBULATORY_CARE_PROVIDER_SITE_OTHER): Payer: Medicaid Other | Admitting: Obstetrics and Gynecology

## 2014-12-08 ENCOUNTER — Encounter: Payer: Self-pay | Admitting: Obstetrics and Gynecology

## 2014-12-08 VITALS — BP 150/90 | Ht 61.0 in | Wt 135.0 lb

## 2014-12-08 DIAGNOSIS — Z9889 Other specified postprocedural states: Secondary | ICD-10-CM

## 2014-12-08 MED ORDER — HYDROCODONE-ACETAMINOPHEN 10-325 MG PO TABS: 1.0000 | ORAL_TABLET | Freq: Four times a day (QID) | ORAL | Status: DC | PRN

## 2014-12-08 NOTE — Progress Notes (Addendum)
Patient ID: Janet Dixon, female   DOB: October 15, 1985, 30 y.o.   MRN: [id number]    Subjective:  Janet Dixon is a 29 y.o. female now 1 weeks status post cesarean section. , her 6 th   she still has dressing in place, and complains of soreness, no fever. She has used percocet q 6 h. She wants an INCreased strength med, which I discouraged Review of Systems Negative except chronic constipation   Diet:   reg   Bowel movements : abnormal with chronic constipation, had one bm in 6 days.  Pain is not well controlled.  Medications being used: narcotic analgesics including Percocet.  Objective:  BP 150/90 mmHg  Ht 5' 1"  (1.549 m)  Wt 135 lb (61.236 kg)  BMI 25.52 kg/m2  Breastfeeding? Yes General:Well developed, well nourished.  No acute distress. Abdomen: Bowel sounds normal, soft, non-tender.  Incision(s):   Healing with some puffiness of incision line, no erythema, , no drainage, no erythema, no hernia, no swelling, no dehiscence,     Assessment:  Post-Op 1 weeks s/p c/s   low pain threshold, as noted in hospital and at prior visits  Doing well postoperatively.   Plan:  1.Wound care discussed will switch to hydrocodone 10/325, so pt can taper to 1/2 pill as pain tolerances improve.  2. . current medications.d/c percocet, add miralax 3. Activity restrictions: routine postop restrictions, no heavy lifting 4. return to work: not applicable. 5. Follow up in 1 week.   Pt called back, 8/30, complaining of inadequate relief with hydrocodone. Rx d/c of hcodone, and ONE refil of percocet will be given x 30 tabs. Pt is reminded to take stool softener with the meds to prevent constipation. Jonnie Kind

## 2014-12-08 NOTE — Progress Notes (Signed)
Patient ID: Janet Dixon, female   DOB: 11/09/1985, 29 y.o.   MRN: [id number] Pt here today for post op visit. Pt had a c-section almost a week ago. Pt states that she has a lot of pain and has to double up on her pain medication.

## 2014-12-09 ENCOUNTER — Telehealth: Payer: Self-pay | Admitting: *Deleted

## 2014-12-09 MED ORDER — OXYCODONE-ACETAMINOPHEN 5-325 MG PO TABS: 1.0000 | ORAL_TABLET | ORAL | Status: DC | PRN

## 2014-12-09 NOTE — Addendum Note (Signed)
Addended by: Jonnie Kind on: 12/09/2014 03:23 PM   Modules accepted: Orders, Medications

## 2014-12-10 ENCOUNTER — Encounter: Payer: Medicaid Other | Admitting: Obstetrics and Gynecology

## 2014-12-11 NOTE — Telephone Encounter (Signed)
Done

## 2014-12-16 ENCOUNTER — Telehealth: Payer: Self-pay | Admitting: Obstetrics and Gynecology

## 2014-12-16 NOTE — Telephone Encounter (Signed)
Pt states she would like a refill on Hydrocodone, pain at c-section incision. Hydrocodone refilled last on 12/08/2104 #30 and pt states she has taken all of them. Appt scheduled with Dr. Glo Herring for 12/17/2014.

## 2014-12-17 ENCOUNTER — Ambulatory Visit (INDEPENDENT_AMBULATORY_CARE_PROVIDER_SITE_OTHER): Payer: Medicaid Other | Admitting: Obstetrics and Gynecology

## 2014-12-17 ENCOUNTER — Encounter: Payer: Self-pay | Admitting: Obstetrics and Gynecology

## 2014-12-17 VITALS — BP 100/60 | Ht 61.0 in | Wt 131.0 lb

## 2014-12-17 DIAGNOSIS — Z9889 Other specified postprocedural states: Secondary | ICD-10-CM

## 2014-12-17 DIAGNOSIS — Z98891 History of uterine scar from previous surgery: Secondary | ICD-10-CM

## 2014-12-17 MED ORDER — TRAMADOL HCL 50 MG PO TABS: 50.0000 mg | ORAL_TABLET | Freq: Four times a day (QID) | ORAL | Status: DC | PRN

## 2014-12-17 NOTE — Progress Notes (Addendum)
This chart was scribed for Jonnie Kind, MD by Erling Conte, ED Scribe. This patient was seen in room 1 and the patient's care was started at 1:57 PM.    Subjective:  Janet Dixon is a 29 y.o. female now 2 weeks status post caesarean section #6.  workin appt for incision concerns , primarily pain control and concern over left side. Skin was scaly an da bit itchy , now improving  Pt here today for post op/ refill on her pain medications. Pt wants Dr.Quirino Kakos to look at her incision, Pt states that she has noticed an odor and the incision hurts underneath the skin not on top.   Review of Systems Negative except she has noticed an odor and the incision hurts to touch   Diet:   normal   Bowel movements : normal.  Pain is controlled with current analgesics. Medications being used: Percocet. Pt is currently using. She is allergic to Ibuprofen and states she experienced throat swelling previously  Objective:  BP 100/60 mmHg  Ht 5' 1"  (1.549 m)  Wt 131 lb (59.421 kg)  BMI 24.76 kg/m2  Breastfeeding? Yes General:Well developed, well nourished.  No acute distress. Abdomen: Bowel sounds normal, soft, non-tender. Pelvic Exam:    External Genitalia:  Normal.    Vagina: Normal    Cervix: Normal    Uterus: Normal    Adnexa/Bimanual: Normal  Incision(s):   Healing well, no drainage, no erythema, no hernia, no swelling, no dehiscence,     Assessment:  Post-Op 2 weeks s/p c-section #6. Probable resolving granulation tissue beneath skin on left side of incision, not need ing tx. Will follow   Doing adequately well postoperatively.   Plan:  1.Wound care discussed   2. . current medications. Prescribe Tramadol and recommended Midol as well to control pain 3. Activity restrictions: continue routine post partum instructions 4. return to work: not applicable. 5. Follow up in 3 weeks.  I personally performed the services described in this documentation, which was SCRIBED in my  presence. The recorded information has been reviewed and considered accurate. It has been edited as necessary during review. Jonnie Kind, MD

## 2014-12-17 NOTE — Patient Instructions (Signed)
Topical aloe, perhaps cortaid also.

## 2014-12-17 NOTE — Progress Notes (Signed)
Patient ID: Janet Dixon, female   DOB: 20-Jun-1985, 29 y.o.   MRN: [id number] Pt here today for post op/ refill on her pain medications. Pt wants Dr.Ferguson to look at her incision, Pt states that she has noticed an odor and the incision hurts underneath the skin not on top.

## 2015-01-07 ENCOUNTER — Ambulatory Visit: Payer: Medicaid Other

## 2015-01-07 ENCOUNTER — Encounter: Payer: Self-pay | Admitting: *Deleted

## 2015-01-15 ENCOUNTER — Encounter: Payer: Self-pay | Admitting: *Deleted

## 2015-01-15 ENCOUNTER — Ambulatory Visit: Payer: Medicaid Other | Admitting: Advanced Practice Midwife

## 2016-08-08 ENCOUNTER — Encounter: Payer: Self-pay | Admitting: Adult Health

## 2016-08-08 ENCOUNTER — Ambulatory Visit (INDEPENDENT_AMBULATORY_CARE_PROVIDER_SITE_OTHER): Payer: Medicaid Other | Admitting: Adult Health

## 2016-08-08 VITALS — BP 114/74 | HR 87 | Ht 65.0 in | Wt 127.0 lb

## 2016-08-08 DIAGNOSIS — N926 Irregular menstruation, unspecified: Secondary | ICD-10-CM | POA: Diagnosis not present

## 2016-08-08 DIAGNOSIS — Z3202 Encounter for pregnancy test, result negative: Secondary | ICD-10-CM

## 2016-08-08 LAB — POCT URINE PREGNANCY: PREG TEST UR: NEGATIVE

## 2016-08-08 MED ORDER — PRENATAL PLUS/IRON 27-1 MG PO TABS: ORAL_TABLET | ORAL | 12 refills | Status: DC

## 2016-08-08 NOTE — Progress Notes (Signed)
Subjective:     Patient ID: Janet Dixon, female   DOB: 1985/10/03, 31 y.o.   MRN: [id number]  HPI Keisha is a 31 year old black female in for UPT, has missed a period, and had +HPT, periods are irregular. Has 20 month old and still breast feeds some. She is [place] with being pregnant, and requests book on pregnancy that we give out.   Review of Systems  +missed period, had +HPT  +irregular periods Reviewed past medical,surgical, social and family history. Reviewed medications and allergies.      Objective:   Physical Exam BP 114/74 (BP Location: Right Arm, Patient Position: Sitting, Cuff Size: Normal)   Pulse 87   Ht 5' 5"  (1.651 m)   Wt 127 lb (57.6 kg)   LMP 06/22/2016 (Approximate)   Breastfeeding? Yes   BMI 21.13 kg/m UPT negatve. Skin warm and dry.  Lungs: clear to ausculation bilaterally. Cardiovascular: regular rate and rhythm.   Will check QHCG.  Assessment:     1. Missed period   2. Irregular periods   3. Pregnancy examination or test, negative result       Plan:    Check QHCG today Meds ordered this encounter  Medications  . Prenatal Vit-Fe Fumarate-FA (PRENATAL PLUS/IRON) 27-1 MG TABS    Sig: Take 1 daily    Dispense:  30 each    Refill:  12    Order Specific Question:   Supervising Provider    Answer:   Tania Ade H [[id number]]  Return in 3 weeks for pap and physical

## 2016-08-29 ENCOUNTER — Encounter: Payer: Self-pay | Admitting: Adult Health

## 2016-08-29 ENCOUNTER — Other Ambulatory Visit (HOSPITAL_COMMUNITY)
Admission: RE | Admit: 2016-08-29 | Discharge: 2016-08-29 | Disposition: A | Discharge status: Home / Self Care | Payer: Medicaid Other | Source: Ambulatory Visit | Attending: Adult Health | Admitting: Adult Health

## 2016-08-29 ENCOUNTER — Ambulatory Visit (INDEPENDENT_AMBULATORY_CARE_PROVIDER_SITE_OTHER): Payer: Medicaid Other | Admitting: Adult Health

## 2016-08-29 VITALS — BP 110/72 | HR 80 | Ht 60.0 in | Wt 132.0 lb

## 2016-08-29 DIAGNOSIS — A5602 Chlamydial vulvovaginitis: Secondary | ICD-10-CM | POA: Insufficient documentation

## 2016-08-29 DIAGNOSIS — B9689 Other specified bacterial agents as the cause of diseases classified elsewhere: Secondary | ICD-10-CM | POA: Diagnosis not present

## 2016-08-29 DIAGNOSIS — A599 Trichomoniasis, unspecified: Secondary | ICD-10-CM | POA: Diagnosis not present

## 2016-08-29 DIAGNOSIS — Z124 Encounter for screening for malignant neoplasm of cervix: Secondary | ICD-10-CM | POA: Diagnosis present

## 2016-08-29 DIAGNOSIS — N898 Other specified noninflammatory disorders of vagina: Secondary | ICD-10-CM

## 2016-08-29 DIAGNOSIS — R87810 Cervical high risk human papillomavirus (HPV) DNA test positive: Secondary | ICD-10-CM | POA: Insufficient documentation

## 2016-08-29 DIAGNOSIS — Z01419 Encounter for gynecological examination (general) (routine) without abnormal findings: Secondary | ICD-10-CM

## 2016-08-29 DIAGNOSIS — Z0000 Encounter for general adult medical examination without abnormal findings: Secondary | ICD-10-CM | POA: Diagnosis not present

## 2016-08-29 DIAGNOSIS — O3680X0 Pregnancy with inconclusive fetal viability, not applicable or unspecified: Secondary | ICD-10-CM | POA: Insufficient documentation

## 2016-08-29 LAB — POCT WET PREP (WET MOUNT)
CLUE CELLS WET PREP WHIFF POC: POSITIVE
WBC, Wet Prep HPF POC: POSITIVE

## 2016-08-29 MED ORDER — METRONIDAZOLE 500 MG PO TABS: ORAL_TABLET | ORAL | 0 refills | Status: DC

## 2016-08-29 MED ORDER — ALBUTEROL SULFATE HFA 108 (90 BASE) MCG/ACT IN AERS: 2.0000 | INHALATION_SPRAY | Freq: Four times a day (QID) | RESPIRATORY_TRACT | 1 refills | Status: DC | PRN

## 2016-08-29 MED ORDER — PRENATAL PLUS/IRON 27-1 MG PO TABS: ORAL_TABLET | ORAL | 12 refills | Status: DC

## 2016-08-29 NOTE — Progress Notes (Signed)
Patient ID: [name], female   DOB: 07-04-1985, 31 y.o.   MRN: [id number] History of Present Illness: Janet Dixon is a 31 year old black female in for well woman gyn exam and pap.   Current Medications, Allergies, Past Medical History, Past Surgical History, Family History and Social History were reviewed in [employer] record.     Review of Systems: Patient denies any headaches, hearing loss, fatigue, blurred vision, shortness of breath, chest pain, abdominal pain, problems with bowel movements, urination, or intercourse. No joint pain or mood swings.   Physical Exam:BP 110/72 (BP Location: Left Arm, Patient Position: Sitting, Cuff Size: Normal)   Pulse 80   Ht 5' (1.524 m)   Wt 132 lb (59.9 kg)   LMP 07/23/2016   Breastfeeding? Yes   BMI 25.78 kg/m  General:  Well developed, well nourished, no acute distress Skin:  Warm and dry Neck:  Midline trachea, normal thyroid, good ROM, no lymphadenopathy Lungs; Clear to auscultation bilaterally Breast:  No dominant palpable mass, retraction, or nipple discharge Cardiovascular: Regular rate and rhythm Abdomen:  Soft, non tender, no hepatosplenomegaly Pelvic:  External genitalia is normal in appearance, no lesions.  The vagina is normal in appearance, has thin pale watery discharge with odor. Marland Kitchen Urethra has no lesions or masses. The cervix is has strawberry appearance, pap with HPV and GC/CHL performed.  Uterus is felt to be normal size, shape, and contour.  No adnexal masses or tenderness noted.Bladder is non tender, no masses felt.Wet prep, +trich and few clues and WBCs. Extremities/musculoskeletal:  No swelling or varicosities noted, no clubbing or cyanosis Psych:  No mood changes, alert and cooperative,seems happy PHQ 2 score 0.  Impression: 1. Encounter for routine gynecological examination with Papanicolaou smear of cervix   2. Routine cervical smear   3. Vaginal discharge   4. Trichimoniasis        Plan: Meds ordered this encounter  Medications  . albuterol (PROVENTIL HFA;VENTOLIN HFA) 108 (90 Base) MCG/ACT inhaler    Sig: Inhale 2 puffs into the lungs every 6 (six) hours as needed for wheezing or shortness of breath.    Dispense:  1 Inhaler    Refill:  1    Order Specific Question:   Supervising Provider    Answer:   Janet Dixon [[id number]]  . Prenatal Vit-Fe Fumarate-FA (PRENATAL PLUS/IRON) 27-1 MG TABS    Sig: Take 1 daily    Dispense:  30 each    Refill:  12    Order Specific Question:   Supervising Provider    Answer:   Janet Dixon [[id number]]  . metroNIDAZOLE (FLAGYL) 500 MG tablet    Sig: Take 4 po    Dispense:  4 tablet    Refill:  0    Order Specific Question:   Supervising Provider    Answer:   Janet Dixon [[id number]]  No sex or alcohol and no breast feeding after flagyl for 2-3 days(she is weaning already) POC in 2 weeks Partner, Janet Dixon was treated too, with flagyl 500 mg #4 4 po now Physical in 1 year Pap in 3 if normal

## 2016-09-02 LAB — CYTOLOGY - PAP
Chlamydia: POSITIVE — AB
DIAGNOSIS: NEGATIVE
HPV (WINDOPATH): DETECTED — AB
Neisseria Gonorrhea: NEGATIVE

## 2016-09-07 ENCOUNTER — Other Ambulatory Visit: Payer: Self-pay | Admitting: Women's Health

## 2016-09-07 ENCOUNTER — Telehealth: Payer: Self-pay | Admitting: *Deleted

## 2016-09-07 ENCOUNTER — Encounter: Payer: Self-pay | Admitting: Women's Health

## 2016-09-07 DIAGNOSIS — A749 Chlamydial infection, unspecified: Secondary | ICD-10-CM | POA: Insufficient documentation

## 2016-09-07 DIAGNOSIS — R87810 Cervical high risk human papillomavirus (HPV) DNA test positive: Secondary | ICD-10-CM | POA: Insufficient documentation

## 2016-09-07 MED ORDER — AZITHROMYCIN 500 MG PO TABS: 1000.0000 mg | ORAL_TABLET | Freq: Once | ORAL | 0 refills | Status: AC

## 2016-09-07 NOTE — Telephone Encounter (Signed)
Unable to leave message

## 2016-09-08 NOTE — Telephone Encounter (Signed)
Number invalid. Will send certified letter.

## 2016-09-14 ENCOUNTER — Ambulatory Visit: Payer: Medicaid Other | Admitting: Adult Health

## 2016-09-14 ENCOUNTER — Encounter: Payer: Self-pay | Admitting: Adult Health

## 2016-09-15 ENCOUNTER — Telehealth: Payer: Self-pay | Admitting: Adult Health

## 2016-09-15 NOTE — Telephone Encounter (Signed)
Mailbox full

## 2016-09-19 ENCOUNTER — Telehealth: Payer: Self-pay | Admitting: Adult Health

## 2016-09-19 NOTE — Telephone Encounter (Signed)
Invalid number

## 2016-09-21 ENCOUNTER — Telehealth: Payer: Self-pay | Admitting: Adult Health

## 2016-09-21 NOTE — Telephone Encounter (Signed)
Mailbox full

## 2016-09-26 ENCOUNTER — Telehealth: Payer: Self-pay | Admitting: Adult Health

## 2016-09-26 NOTE — Telephone Encounter (Signed)
Invalid number and her moms mailbox is full.

## 2016-09-27 ENCOUNTER — Encounter: Payer: Self-pay | Admitting: Adult Health

## 2016-10-18 ENCOUNTER — Telehealth: Payer: Self-pay | Admitting: Adult Health

## 2016-10-18 NOTE — Telephone Encounter (Signed)
Number is invalid , has appt 7/16 on the schedule, so she must be aware

## 2016-10-24 ENCOUNTER — Ambulatory Visit: Payer: Medicaid Other | Admitting: Adult Health

## 2016-10-24 ENCOUNTER — Encounter: Payer: Self-pay | Admitting: Adult Health

## 2016-10-25 ENCOUNTER — Emergency Department (HOSPITAL_COMMUNITY): Payer: Medicaid Other

## 2016-10-25 ENCOUNTER — Encounter: Payer: Self-pay | Admitting: Emergency Medicine

## 2016-10-25 ENCOUNTER — Emergency Department (HOSPITAL_COMMUNITY)
Admission: EM | Admit: 2016-10-25 | Discharge: 2016-10-25 | Disposition: A | Discharge status: Home / Self Care | Payer: Medicaid Other | Attending: Emergency Medicine | Admitting: Emergency Medicine

## 2016-10-25 DIAGNOSIS — F17210 Nicotine dependence, cigarettes, uncomplicated: Secondary | ICD-10-CM | POA: Insufficient documentation

## 2016-10-25 DIAGNOSIS — F1910 Other psychoactive substance abuse, uncomplicated: Secondary | ICD-10-CM | POA: Diagnosis not present

## 2016-10-25 DIAGNOSIS — O219 Vomiting of pregnancy, unspecified: Secondary | ICD-10-CM | POA: Diagnosis not present

## 2016-10-25 DIAGNOSIS — R112 Nausea with vomiting, unspecified: Secondary | ICD-10-CM

## 2016-10-25 DIAGNOSIS — R109 Unspecified abdominal pain: Secondary | ICD-10-CM | POA: Diagnosis not present

## 2016-10-25 DIAGNOSIS — N760 Acute vaginitis: Secondary | ICD-10-CM | POA: Diagnosis not present

## 2016-10-25 DIAGNOSIS — Z3A00 Weeks of gestation of pregnancy not specified: Secondary | ICD-10-CM | POA: Diagnosis not present

## 2016-10-25 DIAGNOSIS — B9689 Other specified bacterial agents as the cause of diseases classified elsewhere: Secondary | ICD-10-CM | POA: Insufficient documentation

## 2016-10-25 DIAGNOSIS — A749 Chlamydial infection, unspecified: Secondary | ICD-10-CM | POA: Insufficient documentation

## 2016-10-25 DIAGNOSIS — R52 Pain, unspecified: Secondary | ICD-10-CM

## 2016-10-25 LAB — URINALYSIS, ROUTINE W REFLEX MICROSCOPIC
Bilirubin Urine: NEGATIVE
Glucose, UA: NEGATIVE mg/dL
Hgb urine dipstick: NEGATIVE
Ketones, ur: 80 mg/dL — AB
Nitrite: NEGATIVE
PH: 6 (ref 5.0–8.0)
Protein, ur: 30 mg/dL — AB

## 2016-10-25 LAB — RAPID URINE DRUG SCREEN, HOSP PERFORMED
AMPHETAMINES: NOT DETECTED
BARBITURATES: NOT DETECTED
BENZODIAZEPINES: POSITIVE — AB
COCAINE: POSITIVE — AB
Opiates: NOT DETECTED
Tetrahydrocannabinol: POSITIVE — AB

## 2016-10-25 LAB — CBC WITH DIFFERENTIAL/PLATELET
BASOS ABS: 0 10*3/uL (ref 0.0–0.1)
Basophils Relative: 0 %
Eosinophils Absolute: 0 10*3/uL (ref 0.0–0.7)
Eosinophils Relative: 0 %
HEMATOCRIT: 38.2 % (ref 36.0–46.0)
Hemoglobin: 13.2 g/dL (ref 12.0–15.0)
Lymphocytes Relative: 24 %
Lymphs Abs: 3.2 10*3/uL (ref 0.7–4.0)
MCH: 31.4 pg (ref 26.0–34.0)
MCHC: 34.6 g/dL (ref 30.0–36.0)
MCV: 91 fL (ref 78.0–100.0)
MONO ABS: 0.7 10*3/uL (ref 0.1–1.0)
Monocytes Relative: 5 %
NEUTROS ABS: 9.4 10*3/uL — AB (ref 1.7–7.7)
NEUTROS PCT: 71 %
Platelets: 359 10*3/uL (ref 150–400)
RBC: 4.2 MIL/uL (ref 3.87–5.11)
RDW: 13.3 % (ref 11.5–15.5)
WBC: 13.3 10*3/uL — ABNORMAL HIGH (ref 4.0–10.5)

## 2016-10-25 LAB — COMPREHENSIVE METABOLIC PANEL
ALT: 21 U/L (ref 14–54)
AST: 30 U/L (ref 15–41)
Albumin: 4.8 g/dL (ref 3.5–5.0)
Alkaline Phosphatase: 67 U/L (ref 38–126)
Anion gap: 15 (ref 5–15)
BILIRUBIN TOTAL: 0.6 mg/dL (ref 0.3–1.2)
BUN: 9 mg/dL (ref 6–20)
CHLORIDE: 101 mmol/L (ref 101–111)
CO2: 23 mmol/L (ref 22–32)
Calcium: 9.8 mg/dL (ref 8.9–10.3)
Creatinine, Ser: 0.73 mg/dL (ref 0.44–1.00)
GFR calc Af Amer: >60 mL/min (ref >60)
GFR calc non Af Amer: >60 mL/min (ref >60)
Glucose, Bld: 102 mg/dL — ABNORMAL HIGH (ref 65–99)
POTASSIUM: 3.5 mmol/L (ref 3.5–5.1)
Sodium: 139 mmol/L (ref 135–145)
TOTAL PROTEIN: 8.4 g/dL — AB (ref 6.5–8.1)

## 2016-10-25 LAB — RAPID HIV SCREEN (HIV 1/2 AB+AG)
HIV 1/2 ANTIBODIES: NONREACTIVE
HIV-1 P24 Antigen - HIV24: NONREACTIVE

## 2016-10-25 LAB — ETHANOL: Alcohol, Ethyl (B): <5 mg/dL (ref <5)

## 2016-10-25 LAB — WET PREP, GENITAL
Sperm: NONE SEEN
Trich, Wet Prep: NONE SEEN
Yeast Wet Prep HPF POC: NONE SEEN

## 2016-10-25 LAB — I-STAT BETA HCG BLOOD, ED (MC, WL, AP ONLY)

## 2016-10-25 LAB — LIPASE, BLOOD: Lipase: 20 U/L (ref 11–51)

## 2016-10-25 MED ORDER — AZITHROMYCIN 250 MG PO TABS
1000.0000 mg | ORAL_TABLET | Freq: Once | ORAL | Status: AC
  Administered 2016-10-25: 1000 mg via ORAL
  Filled 2016-10-25: qty 4

## 2016-10-25 MED ORDER — ONDANSETRON HCL 4 MG/2ML IJ SOLN
4.0000 mg | Freq: Once | INTRAMUSCULAR | Status: AC
  Administered 2016-10-25: 4 mg via INTRAVENOUS
  Filled 2016-10-25: qty 2

## 2016-10-25 MED ORDER — SODIUM CHLORIDE 0.9 % IV BOLUS (SEPSIS)
1000.0000 mL | Freq: Once | INTRAVENOUS | Status: AC
  Administered 2016-10-25: 1000 mL via INTRAVENOUS

## 2016-10-25 MED ORDER — PROMETHAZINE HCL 25 MG/ML IJ SOLN
12.5000 mg | Freq: Once | INTRAMUSCULAR | Status: AC
  Administered 2016-10-25: 12.5 mg via INTRAVENOUS
  Filled 2016-10-25: qty 1

## 2016-10-25 MED ORDER — METRONIDAZOLE 500 MG PO TABS: 500.0000 mg | ORAL_TABLET | Freq: Two times a day (BID) | ORAL | 0 refills | Status: DC

## 2016-10-25 MED ORDER — IOPAMIDOL (ISOVUE-300) INJECTION 61%
100.0000 mL | Freq: Once | INTRAVENOUS | Status: AC | PRN
  Administered 2016-10-25: 100 mL via INTRAVENOUS

## 2016-10-25 MED ORDER — PROMETHAZINE HCL 25 MG PO TABS: 25.0000 mg | ORAL_TABLET | Freq: Four times a day (QID) | ORAL | 0 refills | Status: DC | PRN

## 2016-10-25 MED ORDER — PROMETHAZINE HCL 12.5 MG PO TABS
25.0000 mg | ORAL_TABLET | Freq: Once | ORAL | Status: AC
  Administered 2016-10-25: 25 mg via ORAL
  Filled 2016-10-25: qty 2

## 2016-10-25 NOTE — Discharge Instructions (Signed)
You have been treated for chlamydia today.  Take the entire course of the flagyl for your bacterial vaginosis. Use phenergan for nausea and vomiting if this symptom persists and make sure you are drinking plenty of fluids.  Your partner will need to also be treated for chlamydia as discussed. Do not have sex until you are both treated and symptom free.
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
## 2016-10-25 NOTE — ED Triage Notes (Signed)
Here for abdominal pain and vomiting.  Pt pregnant.  Due date December 5th.
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
## 2016-10-26 LAB — HIV ANTIBODY (ROUTINE TESTING W REFLEX): HIV SCREEN 4TH GENERATION: NONREACTIVE

## 2016-10-26 LAB — GC/CHLAMYDIA PROBE AMP (~~LOC~~) NOT AT ARMC
Chlamydia: POSITIVE — AB
NEISSERIA GONORRHEA: NEGATIVE

## 2016-11-15 ENCOUNTER — Ambulatory Visit: Payer: Medicaid Other | Admitting: Adult Health

## 2016-11-28 ENCOUNTER — Ambulatory Visit: Payer: Medicaid Other | Admitting: Adult Health

## 2016-12-07 ENCOUNTER — Ambulatory Visit (INDEPENDENT_AMBULATORY_CARE_PROVIDER_SITE_OTHER): Payer: Medicaid Other | Admitting: Adult Health

## 2016-12-07 ENCOUNTER — Encounter: Payer: Self-pay | Admitting: Adult Health

## 2016-12-07 VITALS — BP 80/60 | HR 78 | Temp 98.3°F | Ht 61.0 in | Wt 127.0 lb

## 2016-12-07 DIAGNOSIS — Z3202 Encounter for pregnancy test, result negative: Secondary | ICD-10-CM | POA: Diagnosis not present

## 2016-12-07 DIAGNOSIS — R87810 Cervical high risk human papillomavirus (HPV) DNA test positive: Secondary | ICD-10-CM

## 2016-12-07 DIAGNOSIS — N926 Irregular menstruation, unspecified: Secondary | ICD-10-CM | POA: Diagnosis not present

## 2016-12-07 DIAGNOSIS — Z319 Encounter for procreative management, unspecified: Secondary | ICD-10-CM

## 2016-12-07 DIAGNOSIS — R319 Hematuria, unspecified: Secondary | ICD-10-CM | POA: Diagnosis not present

## 2016-12-07 DIAGNOSIS — N390 Urinary tract infection, site not specified: Secondary | ICD-10-CM | POA: Diagnosis not present

## 2016-12-07 DIAGNOSIS — Z8619 Personal history of other infectious and parasitic diseases: Secondary | ICD-10-CM | POA: Diagnosis not present

## 2016-12-07 LAB — POCT URINALYSIS DIPSTICK
Blood, UA: 3
GLUCOSE UA: NEGATIVE
KETONES UA: NEGATIVE
Protein, UA: 2

## 2016-12-07 LAB — POCT WET PREP (WET MOUNT)
Clue Cells Wet Prep Whiff POC: NEGATIVE
Trichomonas Wet Prep HPF POC: ABSENT
WBC WET PREP: POSITIVE

## 2016-12-07 LAB — POCT URINE PREGNANCY: Preg Test, Ur: NEGATIVE

## 2016-12-07 MED ORDER — PRENATAL PLUS/IRON 27-1 MG PO TABS: ORAL_TABLET | ORAL | 12 refills | Status: DC

## 2016-12-07 MED ORDER — SULFAMETHOXAZOLE-TRIMETHOPRIM 800-160 MG PO TABS: 1.0000 | ORAL_TABLET | Freq: Two times a day (BID) | ORAL | 0 refills | Status: DC

## 2016-12-07 NOTE — Patient Instructions (Signed)
NO sex  Push fluids Return in 4 weeks

## 2016-12-07 NOTE — Progress Notes (Signed)
Subjective:     Patient ID: Janet Dixon, female   DOB: [birth year], 31 y.o.   MRN: [id number]  HPI Janet Dixon is a 31 year old black female in for POC for trich and recent + chlamydia, she has not taken meds as prescribed. Has missed a period ad it hurst to pee she says, and she wants to be pregnant, it took 4 years last time to get pregnant.   Review of Systems Missed period Hurts when peeing Wants to be pregnant Reviewed past medical,surgical, social and family history. Reviewed medications and allergies.     Objective:   Physical Exam BP (!) 80/60 (BP Location: Left Arm, Patient Position: Sitting, Cuff Size: Small)   Pulse 78   Temp 98.3 F (36.8 C)   Ht 5' 1"  (1.549 m)   Wt 127 lb (57.6 kg)   LMP 11/07/2016 (Approximate)   Breastfeeding? No   BMI 24.00 kg/m UPT negative,urine dipstick +nitrates,2+ WBCs, 2+ protein and 3+blood. Skin warm and dry.Pelvic: external genitalia is normal in appearance no lesions, vagina: dark blood without odor,urethra has no lesions or masses noted, cervix:smooth and bulbous, uterus: normal size, shape and contour, non tender, no masses felt, adnexa: no masses or tenderness noted. Bladder is non tender and no masses felt. Wet prep:  +WBCs. GC/CHL obtained. Discussed HPV with pt and need to repeat pap in 1 year.Told her to she has got to care for herself and do not settle.     Assessment:     1. Urinary tract infection with hematuria, site unspecified   2. History of chlamydia   3. History of trichomoniasis   4. Missed period   5. Hematuria, unspecified type   6. Pregnancy examination or test, negative result   7. 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

## 2016-12-08 LAB — URINALYSIS, ROUTINE W REFLEX MICROSCOPIC
BILIRUBIN UA: NEGATIVE
Glucose, UA: NEGATIVE
KETONES UA: NEGATIVE
NITRITE UA: NEGATIVE
PH UA: 6 (ref 5.0–7.5)
SPEC GRAV UA: 1.023 (ref 1.005–1.030)
Urobilinogen, Ur: 1 mg/dL (ref 0.2–1.0)

## 2016-12-08 LAB — MICROSCOPIC EXAMINATION
CASTS: NONE SEEN /LPF
WBC, UA: >30 /hpf — AB (ref 0–?)

## 2016-12-09 LAB — GC/CHLAMYDIA PROBE AMP
CHLAMYDIA, DNA PROBE: NEGATIVE
NEISSERIA GONORRHOEAE BY PCR: NEGATIVE

## 2016-12-11 LAB — URINE CULTURE

## 2016-12-13 ENCOUNTER — Telehealth: Payer: Self-pay | Admitting: Adult Health

## 2016-12-13 NOTE — Telephone Encounter (Signed)
Urine + E coli, treated with septra ds, GC/CHL negative,but her phone does not accept in coming calls, if she calls just tell her

## 2016-12-26 ENCOUNTER — Emergency Department (HOSPITAL_COMMUNITY)
Admission: EM | Admit: 2016-12-26 | Discharge: 2016-12-26 | Disposition: A | Discharge status: Home / Self Care | Payer: Medicaid Other | Source: Home / Self Care | Attending: Emergency Medicine | Admitting: Emergency Medicine

## 2016-12-26 ENCOUNTER — Emergency Department (HOSPITAL_COMMUNITY)
Admission: EM | Admit: 2016-12-26 | Discharge: 2016-12-26 | Disposition: A | Discharge status: Home / Self Care | Payer: Medicaid Other | Attending: Emergency Medicine | Admitting: Emergency Medicine

## 2016-12-26 ENCOUNTER — Encounter (HOSPITAL_COMMUNITY): Payer: Self-pay | Admitting: Emergency Medicine

## 2016-12-26 ENCOUNTER — Encounter (HOSPITAL_COMMUNITY): Payer: Self-pay | Admitting: *Deleted

## 2016-12-26 ENCOUNTER — Emergency Department (HOSPITAL_COMMUNITY): Payer: Medicaid Other

## 2016-12-26 DIAGNOSIS — N390 Urinary tract infection, site not specified: Secondary | ICD-10-CM | POA: Diagnosis not present

## 2016-12-26 DIAGNOSIS — R079 Chest pain, unspecified: Secondary | ICD-10-CM | POA: Diagnosis not present

## 2016-12-26 DIAGNOSIS — R112 Nausea with vomiting, unspecified: Secondary | ICD-10-CM | POA: Diagnosis not present

## 2016-12-26 DIAGNOSIS — Z79899 Other long term (current) drug therapy: Secondary | ICD-10-CM | POA: Diagnosis not present

## 2016-12-26 DIAGNOSIS — J45909 Unspecified asthma, uncomplicated: Secondary | ICD-10-CM | POA: Insufficient documentation

## 2016-12-26 DIAGNOSIS — F17210 Nicotine dependence, cigarettes, uncomplicated: Secondary | ICD-10-CM | POA: Insufficient documentation

## 2016-12-26 DIAGNOSIS — R1084 Generalized abdominal pain: Secondary | ICD-10-CM | POA: Diagnosis present

## 2016-12-26 LAB — COMPREHENSIVE METABOLIC PANEL
ALBUMIN: 4.6 g/dL (ref 3.5–5.0)
ALT: 25 U/L (ref 14–54)
ANION GAP: 13 (ref 5–15)
AST: 35 U/L (ref 15–41)
Alkaline Phosphatase: 72 U/L (ref 38–126)
BILIRUBIN TOTAL: 0.5 mg/dL (ref 0.3–1.2)
BUN: 9 mg/dL (ref 6–20)
CALCIUM: 9.6 mg/dL (ref 8.9–10.3)
CO2: 19 mmol/L — AB (ref 22–32)
Chloride: 107 mmol/L (ref 101–111)
Creatinine, Ser: 0.68 mg/dL (ref 0.44–1.00)
GFR calc non Af Amer: >60 mL/min (ref >60)
GLUCOSE: 113 mg/dL — AB (ref 65–99)
POTASSIUM: 3.8 mmol/L (ref 3.5–5.1)
SODIUM: 139 mmol/L (ref 135–145)
TOTAL PROTEIN: 8 g/dL (ref 6.5–8.1)

## 2016-12-26 LAB — CBC WITH DIFFERENTIAL/PLATELET
BASOS PCT: 0 %
Basophils Absolute: 0 10*3/uL (ref 0.0–0.1)
EOS ABS: 0 10*3/uL (ref 0.0–0.7)
Eosinophils Relative: 0 %
HCT: 36.1 % (ref 36.0–46.0)
Hemoglobin: 12.6 g/dL (ref 12.0–15.0)
LYMPHS ABS: 2.9 10*3/uL (ref 0.7–4.0)
Lymphocytes Relative: 22 %
MCH: 31.9 pg (ref 26.0–34.0)
MCHC: 34.9 g/dL (ref 30.0–36.0)
MCV: 91.4 fL (ref 78.0–100.0)
MONO ABS: 0.2 10*3/uL (ref 0.1–1.0)
MONOS PCT: 2 %
Neutro Abs: 10 10*3/uL — ABNORMAL HIGH (ref 1.7–7.7)
Neutrophils Relative %: 76 %
Platelets: 347 10*3/uL (ref 150–400)
RBC: 3.95 MIL/uL (ref 3.87–5.11)
RDW: 13.7 % (ref 11.5–15.5)
WBC: 13.1 10*3/uL — ABNORMAL HIGH (ref 4.0–10.5)

## 2016-12-26 LAB — URINALYSIS, ROUTINE W REFLEX MICROSCOPIC
BILIRUBIN URINE: NEGATIVE
GLUCOSE, UA: NEGATIVE mg/dL
Hgb urine dipstick: NEGATIVE
Ketones, ur: 80 mg/dL — AB
Leukocytes, UA: NEGATIVE
Nitrite: POSITIVE — AB
PH: 6 (ref 5.0–8.0)
PROTEIN: 30 mg/dL — AB
SPECIFIC GRAVITY, URINE: 1.024 (ref 1.005–1.030)

## 2016-12-26 LAB — RAPID URINE DRUG SCREEN, HOSP PERFORMED
Amphetamines: NOT DETECTED
Barbiturates: NOT DETECTED
Benzodiazepines: NOT DETECTED
Cocaine: NOT DETECTED
OPIATES: NOT DETECTED
Tetrahydrocannabinol: POSITIVE — AB

## 2016-12-26 LAB — I-STAT BETA HCG BLOOD, ED (MC, WL, AP ONLY)

## 2016-12-26 LAB — LIPASE, BLOOD: Lipase: 57 U/L — ABNORMAL HIGH (ref 11–51)

## 2016-12-26 MED ORDER — ONDANSETRON HCL 4 MG/2ML IJ SOLN
4.0000 mg | Freq: Once | INTRAMUSCULAR | Status: DC
  Filled 2016-12-26: qty 2

## 2016-12-26 MED ORDER — SODIUM CHLORIDE 0.9 % IV BOLUS (SEPSIS)
1000.0000 mL | Freq: Once | INTRAVENOUS | Status: AC
  Administered 2016-12-26: 1000 mL via INTRAVENOUS

## 2016-12-26 MED ORDER — LORAZEPAM 2 MG/ML IJ SOLN
0.5000 mg | Freq: Once | INTRAMUSCULAR | Status: AC
  Administered 2016-12-26: 0.5 mg via INTRAVENOUS
  Filled 2016-12-26: qty 1

## 2016-12-26 MED ORDER — ONDANSETRON HCL 4 MG/2ML IJ SOLN
4.0000 mg | Freq: Once | INTRAMUSCULAR | Status: AC
  Administered 2016-12-26 (×2): 4 mg via INTRAVENOUS
  Filled 2016-12-26: qty 2

## 2016-12-26 MED ORDER — LORAZEPAM 2 MG/ML IJ SOLN
INTRAMUSCULAR | Status: AC
  Filled 2016-12-26: qty 1

## 2016-12-26 MED ORDER — PANTOPRAZOLE SODIUM 40 MG IV SOLR
40.0000 mg | Freq: Once | INTRAVENOUS | Status: AC
  Administered 2016-12-26: 40 mg via INTRAVENOUS
  Filled 2016-12-26: qty 40

## 2016-12-26 MED ORDER — DEXTROSE 5 % IV SOLN
1.0000 g | Freq: Once | INTRAVENOUS | Status: AC
  Administered 2016-12-26: 1 g via INTRAVENOUS
  Filled 2016-12-26: qty 10

## 2016-12-26 MED ORDER — IOPAMIDOL (ISOVUE-300) INJECTION 61%
100.0000 mL | Freq: Once | INTRAVENOUS | Status: AC | PRN
  Administered 2016-12-26: 100 mL via INTRAVENOUS

## 2016-12-26 MED ORDER — TRAMADOL HCL 50 MG PO TABS: 50.0000 mg | ORAL_TABLET | Freq: Four times a day (QID) | ORAL | 0 refills | Status: DC | PRN

## 2016-12-26 MED ORDER — PROMETHAZINE HCL 25 MG/ML IJ SOLN
12.5000 mg | Freq: Once | INTRAMUSCULAR | Status: AC
  Administered 2016-12-26: 12.5 mg via INTRAVENOUS
  Filled 2016-12-26: qty 1

## 2016-12-26 MED ORDER — CEPHALEXIN 500 MG PO CAPS: 500.0000 mg | ORAL_CAPSULE | Freq: Four times a day (QID) | ORAL | 0 refills | Status: DC

## 2016-12-26 MED ORDER — KETOROLAC TROMETHAMINE 30 MG/ML IJ SOLN
30.0000 mg | Freq: Once | INTRAMUSCULAR | Status: AC
  Administered 2016-12-26: 30 mg via INTRAVENOUS
  Filled 2016-12-26: qty 1

## 2016-12-26 MED ORDER — ONDANSETRON HCL 4 MG/2ML IJ SOLN
INTRAMUSCULAR | Status: AC
  Administered 2016-12-26: 4 mg via INTRAVENOUS
  Filled 2016-12-26: qty 2

## 2016-12-26 MED ORDER — ONDANSETRON 4 MG PO TBDP: ORAL_TABLET | ORAL | 0 refills | Status: DC

## 2016-12-26 NOTE — ED Notes (Signed)
Patient refused to answer questions in triage. Patient stated, "I don't want to be treated."  Patient began climbing over chair in triage and then climbed over the stretcher. When asked if she would like to be treated she refused again. She stood up in the wheel chair that she had been brought in on and began crying when her boyfriend left the room.

## 2016-12-26 NOTE — ED Triage Notes (Signed)
Patient complains of nausea and vomiting. Patient tearful in triage and refusing to answer questions.  Patient's boyfriend states patient started vomiting this morning and complaining of fever and chills.

## 2016-12-26 NOTE — ED Notes (Signed)
Pt combative with staff, denies drug use today.

## 2016-12-26 NOTE — ED Notes (Addendum)
Pt appears to be asleep at this time, will not respond to this RN. Once starting pt's IV pt looks at this RN and clearly states "that's too big". Pt then appears to fall back asleep. Pt beginning to crawl out of bed beginning to V/, pt has no pants or underwear on. She cannot be re-directed by this RN or other staff members. Once IV is in place pt immediately asks for medications and IV fluids, then appears to fall back asleep.

## 2016-12-26 NOTE — ED Notes (Signed)
Pt was asked when was the last time she used mariajuana & that it can causes her symptoms. Pt closed her her & would not respond.

## 2016-12-26 NOTE — ED Notes (Signed)
Pt called for her ride. Pt carried out in wheelchair. Pt left department w/ no questions.

## 2016-12-26 NOTE — Discharge Instructions (Signed)
Drink plenty of fluids and follow-up with his doctor,   later this week

## 2016-12-26 NOTE — ED Triage Notes (Addendum)
Pt c/o mid chest pain, abdominal pain, vomiting, diarrhea that started today around 1000. Unsure of fever. Pt crying, moaning and groaning in triage and not answering questions. Pt is crawling all over the bed, spitting [place] mucus into the floor and onto bed. Pt crying hysterically and having erratic behavior in triage.

## 2016-12-26 NOTE — ED Triage Notes (Signed)
Pt refusing to answer questions in triage. RN attempted to obtain vitals multiple times. Pt continued to take vital sign monitors off and climbed from wheelchair to triage chair to triage stretcher over the rails. Pt moaning and groaning. Pt stated to her boyfriend, "just take my home". Pt encouraged to stay to get triaged, but refused.

## 2016-12-28 ENCOUNTER — Emergency Department (HOSPITAL_COMMUNITY)
Admission: EM | Admit: 2016-12-28 | Discharge: 2016-12-28 | Discharge status: Against Medical Advice | Payer: Medicaid Other | Attending: Emergency Medicine | Admitting: Emergency Medicine

## 2016-12-28 ENCOUNTER — Encounter (HOSPITAL_COMMUNITY): Payer: Self-pay | Admitting: *Deleted

## 2016-12-28 DIAGNOSIS — Z5321 Procedure and treatment not carried out due to patient leaving prior to being seen by health care provider: Secondary | ICD-10-CM | POA: Insufficient documentation

## 2016-12-28 DIAGNOSIS — R109 Unspecified abdominal pain: Secondary | ICD-10-CM | POA: Diagnosis not present

## 2016-12-28 HISTORY — DX: Unspecified asthma, uncomplicated: J45.909

## 2016-12-28 NOTE — ED Provider Notes (Signed)
[facility] DEPT Provider Note   CSN: [id number] Arrival date & time: 12/26/16  1728     History   Chief Complaint Chief Complaint  Patient presents with  . Abdominal Pain  . Chest Pain    HPI ONISHA CEDENO is a 31 y.o. female.  Patient complains of abdominal pain and nausea vomiting.   The history is provided by the patient.  Abdominal Pain   This is a new problem. The current episode started 6 to 12 hours ago. The problem occurs constantly. The problem has not changed since onset.The pain is associated with an unknown factor. The pain is located in the generalized abdominal region. The quality of the pain is aching. The pain is at a severity of 4/10. Associated symptoms include nausea and vomiting. Pertinent negatives include diarrhea, frequency, hematuria and headaches.  Chest Pain   Associated symptoms include abdominal pain, nausea and vomiting. Pertinent negatives include no back pain, no cough and no headaches.  Pertinent negatives for past medical history include no seizures.    Past Medical History:  Diagnosis Date  . Asthma   . History of chlamydia   . History of gonorrhea   . History of trichomoniasis   . Seizures [facility])     Patient Active Problem List   Diagnosis Date Noted  . Pregnancy examination or test, negative result 12/07/2016  . Missed period 12/07/2016  . Hematuria 12/07/2016  . History of trichomoniasis 12/07/2016  . History of chlamydia 12/07/2016  . Patient desires pregnancy 12/07/2016  . Chlamydia 09/07/2016  . Papanicolaou smear of cervix with positive high risk human papilloma virus (HPV) test 09/07/2016  . Trichimoniasis 08/29/2016  . Vaginal discharge 08/29/2016  . Encounter for routine gynecological examination with Papanicolaou smear of cervix 08/29/2016  . Status post cesarean section 12/02/2014  . Urinary tract infection with hematuria 11/30/2014  . Seizure disorder ([place]) 05/01/2014  . Cocaine abuse 03/14/2014    Past  Surgical History:  Procedure Laterality Date  . CESAREAN SECTION    . CESAREAN SECTION N/A 12/02/2014   Procedure: REPEAT CESAREAN SECTION;  Surgeon: Jonnie Kind, MD;  Location: [facility];  Service: Obstetrics;  Laterality: N/A;    OB History    Gravida Para Term Preterm AB Living   7 6 6   1 2    SAB TAB Ectopic Multiple Live Births   1     0 2       Home Medications    Prior to Admission medications   Medication Sig Start Date End Date Taking? Authorizing Provider  albuterol (PROVENTIL HFA;VENTOLIN HFA) 108 (90 Base) MCG/ACT inhaler Inhale 2 puffs into the lungs every 6 (six) hours as needed for wheezing or shortness of breath. 08/29/16   Estill Dooms, NP  cephALEXin (KEFLEX) 500 MG capsule Take 1 capsule (500 mg total) by mouth 4 (four) times daily. 12/26/16   Milton Ferguson, MD  metroNIDAZOLE (FLAGYL) 500 MG tablet Take 1 tablet (500 mg total) by mouth 2 (two) times daily. Patient not taking: Reported on 12/07/2016 10/25/16   Evalee Jefferson, PA-C  ondansetron (ZOFRAN ODT) 4 MG disintegrating tablet 62m ODT q4 hours prn nausea/vomit 12/26/16   ZMilton Ferguson MD  ondansetron (ZOFRAN) 4 MG tablet Take 4 mg by mouth every 8 (eight) hours as needed for nausea or vomiting.    [provider]  Prenatal Vit-Fe Fumarate-FA (PRENATAL PLUS/IRON) 27-1 MG TABS Take 1 daily 12/07/16   GDerrek MonacoA, NP  promethazine (PHENERGAN) 25 MG  tablet Take 1 tablet (25 mg total) by mouth every 6 (six) hours as needed for nausea or vomiting. 10/25/16   Idol, Almyra Free, PA-C  sulfamethoxazole-trimethoprim (BACTRIM DS,SEPTRA DS) 800-160 MG tablet Take 1 tablet by mouth 2 (two) times daily. 12/07/16   Estill Dooms, NP  traMADol (ULTRAM) 50 MG tablet Take 1 tablet (50 mg total) by mouth every 6 (six) hours as needed. 12/26/16   Milton Ferguson, MD    Family History Family History  Problem Relation Age of Onset  . Hypertension Mother   . Asthma Brother   . Asthma Daughter   . Eczema Daughter     . Asthma Son   . Eczema Son   . Aneurysm Maternal Grandmother   . Heart disease Maternal Grandmother   . Cancer Maternal Grandmother   . Asthma Daughter   . Eczema Daughter   . Asthma Son   . Eczema Son   . Eczema Son   . Cancer Maternal Grandfather        colon  . Eczema Maternal Aunt     Social History Social History  Substance Use Topics  . Smoking status: Light Tobacco Smoker    Years: 4.00    Types: Cigarettes  . Smokeless tobacco: Never Used     Comment: 2 per day  . Alcohol use No     Comment: none x 1 month ago     Allergies   Macrobid [nitrofurantoin macrocrystal]; Diphenhydramine hcl; Ibuprofen; and Flexeril [cyclobenzaprine hcl]   Review of Systems Review of Systems  Constitutional: Negative for appetite change and fatigue.  HENT: Negative for congestion, ear discharge and sinus pressure.   Eyes: Negative for discharge.  Respiratory: Negative for cough.   Cardiovascular: Positive for chest pain.  Gastrointestinal: Positive for abdominal pain, nausea and vomiting. Negative for diarrhea.  Genitourinary: Negative for frequency and hematuria.  Musculoskeletal: Negative for back pain.  Skin: Negative for rash.  Neurological: Negative for seizures and headaches.  Psychiatric/Behavioral: Negative for hallucinations.     Physical Exam Updated Vital Signs BP (!) 125/103 (BP Location: Left Arm)   Pulse 84   Temp 100 F (37.8 C) (Oral)   Resp 20   Ht 5' 1"  (1.549 m)   Wt 57.6 kg (127 lb)   LMP 12/05/2016   SpO2 97%   BMI 24.00 kg/m   Physical Exam  Constitutional: She is oriented to person, place, and time. She appears well-developed.  HENT:  Head: Normocephalic.  Eyes: Conjunctivae and EOM are normal. No scleral icterus.  Neck: Neck supple. No thyromegaly present.  Cardiovascular: Normal rate and regular rhythm.  Exam reveals no gallop and no friction rub.   No murmur heard. Pulmonary/Chest: No stridor. She has no wheezes. She has no rales. She  exhibits no tenderness.  Abdominal: She exhibits no distension. There is tenderness. There is no rebound.  Musculoskeletal: Normal range of motion. She exhibits no edema.  Lymphadenopathy:    She has no cervical adenopathy.  Neurological: She is oriented to person, place, and time. She exhibits normal muscle tone. Coordination normal.  Skin: No rash noted. No erythema.  Psychiatric: She has a normal mood and affect. Her behavior is normal.     ED Treatments / Results  Labs (all labs ordered are listed, but only abnormal results are displayed) Labs Reviewed  URINE CULTURE - Abnormal; Notable for the following:       Result Value   Culture >=100,000 COLONIES/mL GRAM NEGATIVE RODS (*)  All other components within normal limits  CBC WITH DIFFERENTIAL/PLATELET - Abnormal; Notable for the following:    WBC 13.1 (*)    Neutro Abs 10.0 (*)    All other components within normal limits  COMPREHENSIVE METABOLIC PANEL - Abnormal; Notable for the following:    CO2 19 (*)    Glucose, Bld 113 (*)    All other components within normal limits  LIPASE, BLOOD - Abnormal; Notable for the following:    Lipase 57 (*)    All other components within normal limits  URINALYSIS, ROUTINE W REFLEX MICROSCOPIC - Abnormal; Notable for the following:    APPearance CLOUDY (*)    Ketones, ur 80 (*)    Protein, ur 30 (*)    Nitrite POSITIVE (*)    Bacteria, UA RARE (*)    Squamous Epithelial / LPF 6-30 (*)    All other components within normal limits  RAPID URINE DRUG SCREEN, HOSP PERFORMED - Abnormal; Notable for the following:    Tetrahydrocannabinol POSITIVE (*)    All other components within normal limits  I-STAT BETA HCG BLOOD, ED ([facility], [facility], [facility] ONLY)    EKG  EKG Interpretation  Date/Time:  Monday December 26 2016 18:01:53 EDT Ventricular Rate:  61 PR Interval:  148 QRS Duration: 88 QT Interval:  420 QTC Calculation: 422 R Axis:   80 Text Interpretation:  Normal sinus rhythm Confirmed by  Randal Buba, April ([id number]) on 12/27/2016 1:21:31 PM       Radiology Ct Abdomen Pelvis W Contrast  Result Date: 12/26/2016 CLINICAL DATA:  Mid chest pain abdominal pain and vomiting with diarrhea EXAM: CT ABDOMEN AND PELVIS WITH CONTRAST TECHNIQUE: Multidetector CT imaging of the abdomen and pelvis was performed using the standard protocol following bolus administration of intravenous contrast. CONTRAST:  172m ISOVUE-300 IOPAMIDOL (ISOVUE-300) INJECTION 61% COMPARISON:  Pelvis ultrasound 10/25/2016, CT 10/25/2016 FINDINGS: Lower chest: Lung bases demonstrate no acute consolidation or effusion. Normal heart size. Hepatobiliary: No focal liver abnormality is seen. No gallstones, gallbladder wall thickening, or biliary dilatation. Pancreas: Unremarkable. No pancreatic ductal dilatation or surrounding inflammatory changes. Spleen: Normal in size without focal abnormality. Adrenals/Urinary Tract: Adrenal glands are within normal limits. Small cortical hypodensity mid right kidney possibly a cyst, unchanged. Punctate nonobstructing stone lower right kidney. Bladder unremarkable. Stomach/Bowel: The stomach is nonenlarged. No dilated small bowel. The colon is collapsed. No definite inflammatory change. Appendix poorly identified but no right lower quadrant inflammation. Vascular/Lymphatic: No significant vascular findings are present. No enlarged abdominal or pelvic lymph nodes. Reproductive: Prominent endometrial stripe as before. No adnexal mass. Other: Negative for free air.  Small free fluid in the pelvis. Musculoskeletal: No acute or significant osseous findings. IMPRESSION: 1. No definite CT evidence for acute intra-abdominal or pelvic abnormality. 2. Small amount of free fluid in the pelvis 3. Probable punctate nonobstructing stone in the lower right kidney. Electronically Signed   By: KDonavan FoilM.D.   On: 12/26/2016 22:28    Procedures Procedures (including critical care time)  Medications Ordered in  ED Medications  sodium chloride 0.9 % bolus 1,000 mL (0 mLs Intravenous Stopped 12/26/16 2118)  ondansetron (ZOFRAN) injection 4 mg (4 mg Intravenous Given 12/26/16 2136)  LORazepam (ATIVAN) injection 0.5 mg (0.5 mg Intravenous Given 12/26/16 1909)  ketorolac (TORADOL) 30 MG/ML injection 30 mg (30 mg Intravenous Given 12/26/16 1901)  pantoprazole (PROTONIX) injection 40 mg (40 mg Intravenous Given 12/26/16 1904)  cefTRIAXone (ROCEPHIN) 1 g in dextrose 5 % 50 mL IVPB (0 g  Intravenous Stopped 12/26/16 2147)  iopamidol (ISOVUE-300) 61 % injection 100 mL (100 mLs Intravenous Contrast Given 12/26/16 2204)  promethazine (PHENERGAN) injection 12.5 mg (12.5 mg Intravenous Given 12/26/16 2243)     Initial Impression / Assessment and Plan / ED Course  I have reviewed the triage vital signs and the nursing notes.  Pertinent labs & imaging results that were available during my care of the patient were reviewed by me and considered in my medical decision making (see chart for details).     Patient with urinary tract infection. She'll be sent home with pain medicine nausea medicine and Keflex.  Final Clinical Impressions(s) / ED Diagnoses   Final diagnoses:  Lower urinary tract infectious disease    New Prescriptions Discharge Medication List as of 12/26/2016 11:24 PM    START taking these medications   Details  cephALEXin (KEFLEX) 500 MG capsule Take 1 capsule (500 mg total) by mouth 4 (four) times daily., Starting Mon 12/26/2016, Print    ondansetron (ZOFRAN ODT) 4 MG disintegrating tablet 25m ODT q4 hours prn nausea/vomit, Print    traMADol (ULTRAM) 50 MG tablet Take 1 tablet (50 mg total) by mouth every 6 (six) hours as needed., Starting Mon 12/26/2016, Print         ZMilton Ferguson MD 12/28/16 1610

## 2016-12-28 NOTE — ED Triage Notes (Signed)
Pt called for recheck of vitals x 1, no answer.

## 2016-12-28 NOTE — ED Triage Notes (Signed)
Pt c/o mid abdominal pain, vomiting that started Monday morning. Pt reports diarrhea on Monday morning. Pt was seen here on Monday and dx with UTI per pt. Pt reports she has been taking the Zofran but she still can't keep any food or liquids down.

## 2016-12-28 NOTE — ED Notes (Signed)
Pt called no answer

## 2016-12-29 LAB — URINE CULTURE: Culture: >=100000 — AB

## 2016-12-30 ENCOUNTER — Telehealth: Payer: Self-pay

## 2016-12-30 NOTE — Telephone Encounter (Signed)
Post ED Visit - Positive Culture Follow-up  Culture report reviewed by antimicrobial stewardship pharmacist:  []  Elenor Quinones, Pharm.D. []  Heide Guile, Pharm.D., BCPS AQ-ID [x]  Parks Neptune, Pharm.D., BCPS []  Alycia Rossetti, Pharm.D., BCPS []  [place], Pharm.D., BCPS, AAHIVP []  Legrand Como, Pharm.D., BCPS, AAHIVP []  Salome Arnt, PharmD, BCPS []  Dimitri Ped, PharmD, BCPS []  Vincenza Hews, PharmD, BCPS  Positive urine culture Treated with Cephalexin, organism sensitive to the same and no further patient follow-up is required at this time.  Genia Del 12/30/2016, 10:00 AM

## 2017-01-04 ENCOUNTER — Ambulatory Visit (INDEPENDENT_AMBULATORY_CARE_PROVIDER_SITE_OTHER): Payer: Medicaid Other | Admitting: Adult Health

## 2017-01-04 ENCOUNTER — Encounter: Payer: Self-pay | Admitting: Adult Health

## 2017-01-04 VITALS — BP 118/60 | HR 88 | Ht 62.5 in | Wt 125.0 lb

## 2017-01-04 DIAGNOSIS — Z319 Encounter for procreative management, unspecified: Secondary | ICD-10-CM

## 2017-01-04 DIAGNOSIS — N926 Irregular menstruation, unspecified: Secondary | ICD-10-CM

## 2017-01-04 NOTE — Progress Notes (Signed)
Subjective:     Patient ID: TROI BECHTOLD, female   DOB: [birth year], 31 y.o.   MRN: [id number]  HPI Jancy is a 31 year old black female, G6P6 in to discuss getting pregnant, she has been trying for >18 months and has not gotten pregnant.She missed this month and had negative HPT. She has had 6 C-sections.  Review of Systems Missed period, had negative HPT Reviewed past medical,surgical, social and family history. Reviewed medications and allergies.     Objective:   Physical Exam BP 118/60 (BP Location: Left Arm, Patient Position: Sitting, Cuff Size: Normal)   Pulse 88   Ht 5' 2.5" (1.588 m)   Wt 125 lb (56.7 kg)   LMP 12/05/2016 Comment: Neg preg test 01/03/17  BMI 22.50 kg/m    Talk only, discussed timing of sex and it could be female factor as he has no kids, and that this will be her 7th C section.She is aware and is interested in Clomid. She has tried meds off the net to boost fertility.   Assessment:     1. Missed period   2. Patient desires pregnancy       Plan:    Call next week if starts or not, if starts will check progesterone level day 21 of cycle. Review handout on clomid

## 2017-01-04 NOTE — Patient Instructions (Signed)
Clomiphene tablets What is this medicine? CLOMIPHENE (KLOE mi feen) is a fertility drug that increases the chance of pregnancy. It helps women ovulate (produce a mature egg) during their cycle. This medicine may be used for other purposes; ask your health care provider or pharmacist if you have questions. COMMON BRAND NAME(S): Clomid, Serophene What should I tell my health care provider before I take this medicine? They need to know if you have any of these conditions: -adrenal gland disease -blood vessel disease or blood clots -cyst on the ovary -endometriosis -liver disease -ovarian cancer -pituitary gland disease -vaginal bleeding that has not been evaluated -an unusual or allergic reaction to clomiphene, other medicines, foods, dyes, or preservatives -pregnant (should not be used if you are already pregnant) -breast-feeding How should I use this medicine? Take this medicine by mouth with a glass of water. Follow the directions on the prescription label. Take exactly as directed for the exact number of days prescribed. Take your doses at regular intervals. Most women take this medicine for a 5 day period, but the length of treatment may be adjusted. Your doctor will give you a start date for this medication and will give you instructions on proper use. Do not take your medicine more often than directed. Talk to your pediatrician regarding the use of this medicine in children. Special care may be needed. Overdosage: If you think you have taken too much of this medicine contact a poison control center or emergency room at once. NOTE: This medicine is only for you. Do not share this medicine with others. What if I miss a dose? If you miss a dose, take it as soon as you can. If it is almost time for your next dose, take only that dose. Do not take double or extra doses. What may interact with this medicine? -herbal or dietary supplements, like blue cohosh, black cohosh, chasteberry, or  DHEA -prasterone This list may not describe all possible interactions. Give your health care provider a list of all the medicines, herbs, non-prescription drugs, or dietary supplements you use. Also tell them if you smoke, drink alcohol, or use illegal drugs. Some items may interact with your medicine. What should I watch for while using this medicine? Make sure you understand how and when to use this medicine. You need to know when you are ovulating and when to have sexual intercourse. This will increase the chance of a pregnancy. Visit your doctor or health care professional for regular checks on your progress. You may need tests to check the hormone levels in your blood or you may have to use home-urine tests to check for ovulation. Try to keep any appointments. Compared to other fertility treatments, this medicine does not greatly increase your chances of having multiple babies. An increased chance of having twins may occur in roughly 5 out of every 100 women who take this medication. Stop taking this medicine at once and contact your doctor or health care professional if you think you are pregnant. This medicine is not for long-term use. Most women that benefit from this medicine do so within the first three cycles (months). Your doctor or health care professional will monitor your condition. This medicine is usually used for a total of 6 cycles of treatment. You may get drowsy or dizzy. Do not drive, use machinery, or do anything that needs mental alertness until you know how this drug affects you. Do not stand or sit up quickly. This reduces the risk of dizzy or  fainting spells. Drinking alcoholic beverages or smoking tobacco may decrease your chance of becoming pregnant. Limit or stop alcohol and tobacco use during your fertility treatments. What side effects may I notice from receiving this medicine? Side effects that you should report to your doctor or health care professional as soon as  possible: -allergic reactions like skin rash, itching or hives, swelling of the face, lips, or tongue -breathing problems -changes in vision -fluid retention -nausea, vomiting -pelvic pain or bloating -severe abdominal pain -sudden weight gain Side effects that usually do not require medical attention (report to your doctor or health care professional if they continue or are bothersome): -breast discomfort -hot flashes -mild pelvic discomfort -mild nausea This list may not describe all possible side effects. Call your doctor for medical advice about side effects. You may report side effects to FDA at 1-800-FDA-1088. Where should I keep my medicine? Keep out of the reach of children. Store at room temperature between 15 and 30 degrees C (59 and 86 degrees F). Protect from heat, light, and moisture. Throw away any unused medicine after the expiration date. NOTE: This sheet is a summary. It may not cover all possible information. If you have questions about this medicine, talk to your doctor, pharmacist, or health care provider.  2018 Elsevier/Gold Standard (2007-07-09 22:21:06)

## 2017-01-13 ENCOUNTER — Emergency Department (HOSPITAL_COMMUNITY)
Admission: EM | Admit: 2017-01-13 | Discharge: 2017-01-14 | Disposition: A | Discharge status: Home / Self Care | Payer: Medicaid Other | Attending: Emergency Medicine | Admitting: Emergency Medicine

## 2017-01-13 DIAGNOSIS — N3000 Acute cystitis without hematuria: Secondary | ICD-10-CM

## 2017-01-13 DIAGNOSIS — Z79899 Other long term (current) drug therapy: Secondary | ICD-10-CM | POA: Diagnosis not present

## 2017-01-13 DIAGNOSIS — J45909 Unspecified asthma, uncomplicated: Secondary | ICD-10-CM | POA: Diagnosis not present

## 2017-01-13 DIAGNOSIS — R1084 Generalized abdominal pain: Secondary | ICD-10-CM | POA: Diagnosis not present

## 2017-01-13 DIAGNOSIS — Z87891 Personal history of nicotine dependence: Secondary | ICD-10-CM | POA: Diagnosis not present

## 2017-01-13 DIAGNOSIS — R112 Nausea with vomiting, unspecified: Secondary | ICD-10-CM

## 2017-01-13 DIAGNOSIS — R1110 Vomiting, unspecified: Secondary | ICD-10-CM | POA: Diagnosis present

## 2017-01-13 DIAGNOSIS — R197 Diarrhea, unspecified: Secondary | ICD-10-CM | POA: Insufficient documentation

## 2017-01-13 LAB — COMPREHENSIVE METABOLIC PANEL
ALT: 23 U/L (ref 14–54)
ANION GAP: 11 (ref 5–15)
AST: 28 U/L (ref 15–41)
Albumin: 4.7 g/dL (ref 3.5–5.0)
Alkaline Phosphatase: 72 U/L (ref 38–126)
BUN: 12 mg/dL (ref 6–20)
CHLORIDE: 108 mmol/L (ref 101–111)
CO2: 23 mmol/L (ref 22–32)
Calcium: 9.9 mg/dL (ref 8.9–10.3)
Creatinine, Ser: 0.65 mg/dL (ref 0.44–1.00)
Glucose, Bld: 123 mg/dL — ABNORMAL HIGH (ref 65–99)
POTASSIUM: 3.9 mmol/L (ref 3.5–5.1)
SODIUM: 142 mmol/L (ref 135–145)
Total Bilirubin: 0.5 mg/dL (ref 0.3–1.2)
Total Protein: 8.1 g/dL (ref 6.5–8.1)

## 2017-01-13 LAB — URINALYSIS, ROUTINE W REFLEX MICROSCOPIC
BILIRUBIN URINE: NEGATIVE
Glucose, UA: NEGATIVE mg/dL
KETONES UR: 20 mg/dL — AB
Nitrite: NEGATIVE
PH: 7 (ref 5.0–8.0)
PROTEIN: 100 mg/dL — AB
Specific Gravity, Urine: 1.03 (ref 1.005–1.030)

## 2017-01-13 LAB — RAPID URINE DRUG SCREEN, HOSP PERFORMED
Amphetamines: NOT DETECTED
BARBITURATES: NOT DETECTED
Benzodiazepines: NOT DETECTED
Cocaine: POSITIVE — AB
Opiates: NOT DETECTED
Tetrahydrocannabinol: POSITIVE — AB

## 2017-01-13 LAB — CBC
HEMATOCRIT: 40.2 % (ref 36.0–46.0)
HEMOGLOBIN: 13.3 g/dL (ref 12.0–15.0)
MCH: 30.9 pg (ref 26.0–34.0)
MCHC: 33.1 g/dL (ref 30.0–36.0)
MCV: 93.3 fL (ref 78.0–100.0)
PLATELETS: 344 10*3/uL (ref 150–400)
RBC: 4.31 MIL/uL (ref 3.87–5.11)
RDW: 14.4 % (ref 11.5–15.5)
WBC: 10.2 10*3/uL (ref 4.0–10.5)

## 2017-01-13 LAB — LIPASE, BLOOD: LIPASE: 106 U/L — AB (ref 11–51)

## 2017-01-13 LAB — PREGNANCY, URINE: Preg Test, Ur: NEGATIVE

## 2017-01-13 MED ORDER — HALOPERIDOL LACTATE 5 MG/ML IJ SOLN
2.0000 mg | Freq: Once | INTRAMUSCULAR | Status: AC
  Administered 2017-01-14: 2 mg via INTRAVENOUS
  Filled 2017-01-13: qty 1

## 2017-01-13 MED ORDER — SODIUM CHLORIDE 0.9 % IV BOLUS (SEPSIS)
1000.0000 mL | Freq: Once | INTRAVENOUS | Status: AC
  Administered 2017-01-14: 1000 mL via INTRAVENOUS

## 2017-01-13 MED ORDER — SODIUM CHLORIDE 0.9 % IV BOLUS (SEPSIS)
1000.0000 mL | Freq: Once | INTRAVENOUS | Status: AC
  Administered 2017-01-13: 1000 mL via INTRAVENOUS

## 2017-01-13 MED ORDER — ONDANSETRON HCL 4 MG/2ML IJ SOLN
4.0000 mg | Freq: Once | INTRAMUSCULAR | Status: AC
  Administered 2017-01-13: 4 mg via INTRAVENOUS

## 2017-01-13 MED ORDER — ONDANSETRON HCL 4 MG/2ML IJ SOLN
INTRAMUSCULAR | Status: AC
  Filled 2017-01-13: qty 2

## 2017-01-13 MED ORDER — METOCLOPRAMIDE HCL 5 MG/ML IJ SOLN
10.0000 mg | Freq: Once | INTRAMUSCULAR | Status: AC
  Administered 2017-01-13: 10 mg via INTRAVENOUS
  Filled 2017-01-13: qty 2

## 2017-01-13 MED ORDER — DICYCLOMINE HCL 10 MG/ML IM SOLN
20.0000 mg | Freq: Once | INTRAMUSCULAR | Status: AC
  Administered 2017-01-13: 20 mg via INTRAMUSCULAR
  Filled 2017-01-13: qty 2

## 2017-01-13 NOTE — ED Notes (Signed)
Pt will not keep her gown on and naked under the blanket

## 2017-01-13 NOTE — ED Triage Notes (Addendum)
Pt reports with n/v/d, no fever. Desires pregnancy. Pt states she only had a period for 3 days which is abnormal for her. Pt denies smoking marijuana.

## 2017-01-13 NOTE — ED Provider Notes (Signed)
[facility] DEPT Provider Note   CSN: [id number] Arrival date & time: 01/13/17  2240  Time seen 23:15 PM   History   Chief Complaint Chief Complaint  Patient presents with  . Emesis    HPI LOANY NEUROTH is a 31 y.o. female.  HPI  she is very Dixon, Janet Dixon laying on the stretcher so her backside was naked. Janet I went in the room to see her she appeared to be asleep. She would intermittently spit and a emesis bag. There was no actual vomiting. She is an extremely poor historian. She intermittently fast relates between pretending to be asleep and then crying. She states she started having pain this morning. She states she has diffuse abdominal pain, she states her stomach feels tight and balls up and she has a "stomachache". She states she's had "a whole bunch" of vomiting. She's had 3-4 loose stools today she denies fever. She states "I'm having chronic abdominal pain". Janet asked how long she's had it she states all day today. She denies dysuria or frequency. She states she had this pain once before and was diagnosed a UTI but she doesn't believe it. During the course of my interview patient takes off her Dixon and is laying underneath her blanket she brought from home naked. Janet I ask her why she did that she states it was because her stomach was hurting.  PCP Patient, No Pcp Per   Past Medical History:  Diagnosis Date  . Asthma   . History of chlamydia   . History of gonorrhea   . History of trichomoniasis   . Seizures [facility])     Patient Active Problem List   Diagnosis Date Noted  . Pregnancy examination or test, negative result 12/07/2016  . Missed period 12/07/2016  . Hematuria 12/07/2016  . History of trichomoniasis 12/07/2016  . History of chlamydia 12/07/2016  . Patient desires pregnancy 12/07/2016  . Chlamydia 09/07/2016  . Papanicolaou smear of cervix with positive high risk human  papilloma virus (HPV) test 09/07/2016  . Trichimoniasis 08/29/2016  . Vaginal discharge 08/29/2016  . Encounter for routine gynecological examination with Papanicolaou smear of cervix 08/29/2016  . Status post cesarean section 12/02/2014  . Urinary tract infection with hematuria 11/30/2014  . Seizure disorder ([place]) 05/01/2014  . Cocaine abuse ([place]) 03/14/2014    Past Surgical History:  Procedure Laterality Date  . CESAREAN SECTION    . CESAREAN SECTION N/A 12/02/2014   Procedure: REPEAT CESAREAN SECTION;  Surgeon: Jonnie Kind, MD;  Location: [facility];  Service: Obstetrics;  Laterality: N/A;    OB History    Gravida Para Term Preterm AB Living   6 6 6    0 2   SAB TAB Ectopic Multiple Live Births   0     0 2       Home Medications    Prior to Admission medications   Medication Sig Start Date End Date Taking? Authorizing Provider  albuterol (PROVENTIL HFA;VENTOLIN HFA) 108 (90 Base) MCG/ACT inhaler Inhale 2 puffs into the lungs every 6 (six) hours as needed for wheezing or shortness of breath. 08/29/16   Estill Dooms, NP  ondansetron (ZOFRAN) 4 MG tablet Take 1 tablet (4 mg total) by mouth every 8 (eight) hours as needed for nausea or vomiting. 01/14/17   Rolland Porter, MD  Prenatal Vit-Fe Fumarate-FA (PRENATAL PLUS/IRON) 27-1 MG TABS Take 1  daily 12/07/16   Estill Dooms, NP    Family History Family History  Problem Relation Age of Onset  . Hypertension Mother   . Asthma Brother   . Asthma Daughter   . Eczema Daughter   . Asthma Son   . Eczema Son   . Aneurysm Maternal Grandmother   . Heart disease Maternal Grandmother   . Cancer Maternal Grandmother   . Asthma Daughter   . Eczema Daughter   . Asthma Son   . Eczema Son   . Eczema Son   . Cancer Maternal Grandfather        colon  . Eczema Maternal Aunt     Social History Social History  Substance Use Topics  . Smoking status: Former Smoker    Years: 4.00    Types: Cigarettes  . Smokeless tobacco:  Never Used     Comment: 2 per day  . Alcohol use No     Comment: none x 1 month ago  unemployed   Allergies   Macrobid [nitrofurantoin macrocrystal]; Diphenhydramine hcl; Ibuprofen; and Flexeril [cyclobenzaprine hcl]   Review of Systems Review of Systems  All other systems reviewed and are negative.    Physical Exam Updated Vital Signs BP (!) 113/92 (BP Location: Right Arm)   Pulse 69   Temp 98.2 F (36.8 C) (Oral)   Resp 20   LMP 01/10/2017   SpO2 100%   Vital signs normal    Physical Exam  Constitutional: She is oriented to person, place, and time. She appears well-developed and well-nourished.  Non-toxic appearance. She does not appear ill. She appears distressed.  HENT:  Head: Normocephalic and atraumatic.  Right Ear: External ear normal.  Left Ear: External ear normal.  Nose: Nose normal. No mucosal edema or rhinorrhea.  Mouth/Throat: Oropharynx is clear and moist and mucous membranes are normal. No dental abscesses or uvula swelling.  Eyes: Pupils are equal, round, and reactive to light. Conjunctivae and EOM are normal.  Neck: Normal range of motion and full passive range of motion without pain. Neck supple.  Cardiovascular: Normal rate, regular rhythm and normal heart sounds.  Exam reveals no gallop and no friction rub.   No murmur heard. Pulmonary/Chest: Effort normal and breath sounds normal. No respiratory distress. She has no wheezes. She has no rhonchi. She has no rales. She exhibits no tenderness and no crepitus.  Abdominal: Soft. Normal appearance and bowel sounds are normal. She exhibits no distension. There is generalized tenderness. There is guarding. There is no rebound.  Musculoskeletal: Normal range of motion. She exhibits no edema or tenderness.  Moves all extremities well.   Neurological: She is alert and oriented to person, place, and time. She has normal strength. No cranial nerve deficit.  Skin: Skin is warm, dry and intact. No rash noted. No  erythema. No pallor.  Psychiatric: She has a normal mood and affect. Her speech is normal and behavior is normal. Her mood appears not anxious.  Nursing note and vitals reviewed.    ED Treatments / Results  Labs (all labs ordered are listed, but only abnormal results are displayed) Results for orders placed or performed during the hospital encounter of 01/13/17  Lipase, blood  Result Value Ref Range   Lipase 106 (H) 11 - 51 U/L  Comprehensive metabolic panel  Result Value Ref Range   Sodium 142 135 - 145 mmol/L   Potassium 3.9 3.5 - 5.1 mmol/L   Chloride 108 101 - 111 mmol/L  CO2 23 22 - 32 mmol/L   Glucose, Bld 123 (H) 65 - 99 mg/dL   BUN 12 6 - 20 mg/dL   Creatinine, Ser 0.65 0.44 - 1.00 mg/dL   Calcium 9.9 8.9 - 10.3 mg/dL   Total Protein 8.1 6.5 - 8.1 g/dL   Albumin 4.7 3.5 - 5.0 g/dL   AST 28 15 - 41 U/L   ALT 23 14 - 54 U/L   Alkaline Phosphatase 72 38 - 126 U/L   Total Bilirubin 0.5 0.3 - 1.2 mg/dL   GFR calc non Af Amer >60 >60 mL/min   GFR calc Af Amer >60 >60 mL/min   Anion gap 11 5 - 15  CBC  Result Value Ref Range   WBC 10.2 4.0 - 10.5 K/uL   RBC 4.31 3.87 - 5.11 MIL/uL   Hemoglobin 13.3 12.0 - 15.0 g/dL   HCT 40.2 36.0 - 46.0 %   MCV 93.3 78.0 - 100.0 fL   MCH 30.9 26.0 - 34.0 pg   MCHC 33.1 30.0 - 36.0 g/dL   RDW 14.4 11.5 - 15.5 %   Platelets 344 150 - 400 K/uL  Urinalysis, Routine w reflex microscopic  Result Value Ref Range   Color, Urine YELLOW YELLOW   APPearance HAZY (A) CLEAR   Specific Gravity, Urine 1.030 1.005 - 1.030   pH 7.0 5.0 - 8.0   Glucose, UA NEGATIVE NEGATIVE mg/dL   Hgb urine dipstick SMALL (A) NEGATIVE   Bilirubin Urine NEGATIVE NEGATIVE   Ketones, ur 20 (A) NEGATIVE mg/dL   Protein, ur 100 (A) NEGATIVE mg/dL   Nitrite NEGATIVE NEGATIVE   Leukocytes, UA TRACE (A) NEGATIVE   RBC / HPF 0-5 0 - 5 RBC/hpf   WBC, UA 6-30 0 - 5 WBC/hpf   Bacteria, UA RARE (A) NONE SEEN   Squamous Epithelial / LPF 6-30 (A) NONE SEEN   Mucus  PRESENT   Pregnancy, urine  Result Value Ref Range   Preg Test, Ur NEGATIVE NEGATIVE  Urine rapid drug screen (hosp performed)  Result Value Ref Range   Opiates NONE DETECTED NONE DETECTED   Cocaine POSITIVE (A) NONE DETECTED   Benzodiazepines NONE DETECTED NONE DETECTED   Amphetamines NONE DETECTED NONE DETECTED   Tetrahydrocannabinol POSITIVE (A) NONE DETECTED   Barbiturates NONE DETECTED NONE DETECTED  Ethanol  Result Value Ref Range   Alcohol, Ethyl (B) <10 <10 mg/dL   Laboratory interpretation all normal except Mildly elevated lipase, positive UDS, possible UTI   December 29, 2016 Specimen Description URINE, CLEAN CATCH   Special Requests NONE   Culture >=100,000 COLONIES/mL ESCHERICHIA COLI    Report Status 12/29/2016 FINAL   Organism ID, Bacteria ESCHERICHIA COLI    Resulting Agency [facility]  Susceptibility    Escherichia coli    MIC    AMPICILLIN >=32 RESIST... Resistant    AMPICILLIN/SULBACTAM 16 INTERMED... Intermediate    CEFAZOLIN <=4 SENSITIVE "><=4 SENSITIVE  Sensitive    CEFTRIAXONE <=1 SENSITIVE "><=1 SENSITIVE  Sensitive    CIPROFLOXACIN <=0.25 SENSITIVE "><=0.25 SENS... Sensitive    Extended ESBL NEGATIVE  Sensitive    GENTAMICIN <=1 SENSITIVE "><=1 SENSITIVE  Sensitive    IMIPENEM <=0.25 SENSITIVE "><=0.25 SENS... Sensitive    NITROFURANTOIN 64 INTERMED... Intermediate    PIP/TAZO <=4 SENSITIVE "><=4 SENSITIVE  Sensitive    TRIMETH/SULFA >=320 RESIS... Resistant         Susceptibility Comments   Escherichia coli  >=100,000 COLONIES/mL ESCHERICHIA COLI    Specimen Collected: 12/26/16 19:26  Last Resulted: 12/29/16 07:08    Pt treated on December 26, 2016 with Keflex   UDS on October 25, 2016    Ref Range & Units 49moago 233yrgo   Opiates NONE DETECTED NONE DETECTED  NONE DETECTED    Cocaine NONE DETECTED POSITIVE   POSITIVE     Benzodiazepines NONE DETECTED POSITIVE   NONE DETECTED    Amphetamines NONE DETECTED NONE DETECTED  NONE DETECTED     Tetrahydrocannabinol NONE DETECTED POSITIVE   POSITIVE     Barbiturates NONE DETECTED NONE DETECTED  NONE DETECTEDCM   Comment:         EKG  EKG Interpretation None       Radiology No results found.    Ct Abdomen Pelvis W Contrast  Result Date: 12/26/2016 CLINICAL DATA:  Mid chest pain abdominal pain and vomiting with diarrhea  IMPRESSION: 1. No definite CT evidence for acute intra-abdominal or pelvic abnormality. 2. Small amount of free fluid in the pelvis 3. Probable punctate nonobstructing stone in the lower right kidney. Electronically Signed   By: KiDonavan Foil.D.   On: 12/26/2016 22:28     Procedures Procedures (including critical care time)  Medications Ordered in ED Medications  ondansetron (ZOFRAN) injection 4 mg (4 mg Intravenous Given 01/13/17 2310)  sodium chloride 0.9 % bolus 1,000 mL (0 mLs Intravenous Stopped 01/14/17 0058)  sodium chloride 0.9 % bolus 1,000 mL (0 mLs Intravenous Stopped 01/14/17 0328)  dicyclomine (BENTYL) injection 20 mg (20 mg Intramuscular Given 01/13/17 2335)  metoCLOPramide (REGLAN) injection 10 mg (10 mg Intravenous Given 01/13/17 2335)  haloperidol lactate (HALDOL) injection 2 mg (2 mg Intravenous Given 01/14/17 0001)  cefTRIAXone (ROCEPHIN) 1 g in dextrose 5 % 50 mL IVPB (0 g Intravenous Stopped 01/14/17 0035)     Initial Impression / Assessment and Plan / ED Course  I have reviewed the triage vital signs and the nursing notes.  Pertinent labs & imaging results that were available during my care of the patient were reviewed by me and considered in my medical decision making (see chart for details).    Patient was given IV fluids, IV Reglan and Zofran and she was given Bentyl IM for her pain.  After reviewing her urinalysis she was given IV Rocephin for possible UTI.   Recheck 2:30 AM patient has been sleeping. Janet awakened she states she still having pain. We discussed her test results. Patient wants to know if any other  testing was going to be done. She recently had a CT scan so I don't feel that this indicated tonight. She does admit to doing marijuana every other day. She seems bewildered about the cocaine.  3 AM nurse reports patient urinated on the floor. It should be noted however patient has only been spitting into the emesis bag and she has had no diarrhea.  Recheck 4:30 AM patient sleeping soundly. She was awakened and she states she's rated go home she has some mild nausea, she was given additional Zofran.  Final Clinical Impressions(s) / ED Diagnoses   Final diagnoses:  Generalized abdominal pain  Nausea vomiting and diarrhea    New Prescriptions New Prescriptions   ONDANSETRON (ZOFRAN) 4 MG TABLET    Take 1 tablet (4 mg total) by mouth every 8 (eight) hours as needed for nausea or vomiting.    Plan discharge  IvRolland PorterMD, FABarbette OrMD 01/14/17 [id number]

## 2017-01-13 NOTE — ED Notes (Signed)
Pt dry heaving in triage, wouldn't answer questions, acting as if she was going to fall out of the wheelchair. Pt was dropped of by her boyfriend.

## 2017-01-14 LAB — ETHANOL

## 2017-01-14 MED ORDER — ONDANSETRON HCL 4 MG/2ML IJ SOLN
INTRAMUSCULAR | Status: AC
  Filled 2017-01-14: qty 2

## 2017-01-14 MED ORDER — ONDANSETRON HCL 4 MG/2ML IJ SOLN
4.0000 mg | Freq: Once | INTRAMUSCULAR | Status: AC
  Administered 2017-01-14: 4 mg via INTRAVENOUS

## 2017-01-14 MED ORDER — ONDANSETRON HCL 4 MG PO TABS: 4.0000 mg | ORAL_TABLET | Freq: Three times a day (TID) | ORAL | 0 refills | Status: DC | PRN

## 2017-01-14 MED ORDER — DICYCLOMINE HCL 20 MG PO TABS: 20.0000 mg | ORAL_TABLET | Freq: Three times a day (TID) | ORAL | 0 refills | Status: DC

## 2017-01-14 MED ORDER — CEPHALEXIN 500 MG PO CAPS: 500.0000 mg | ORAL_CAPSULE | Freq: Three times a day (TID) | ORAL | 0 refills | Status: DC

## 2017-01-14 MED ORDER — DEXTROSE 5 % IV SOLN
1.0000 g | Freq: Once | INTRAVENOUS | Status: AC
  Administered 2017-01-14: 1 g via INTRAVENOUS
  Filled 2017-01-14: qty 10

## 2017-01-14 NOTE — ED Notes (Signed)
Pt called out to use the restroom and I went into room and she was squatting next to bed, urinating in the floor. Pt states she could not make to the restroom.

## 2017-01-14 NOTE — Discharge Instructions (Signed)
STOP SMOKING MARIJUANA AND COCAINE!!! Both can cause abdominal pain. Take the medications as prescribed. Drink plenty of fluids (clear liquids) then start a bland diet later this morning such as toast, crackers, jello, [employer] chicken noodle soup. Use the zofran for nausea or vomiting. Take imodium OTC for diarrhea. Avoid milk products until the diarrhea is gone. Consider evaluation by a gastroenterologist about your abdominal pain. Call Dr Roseanne Kaufman office to get an appointment.

## 2017-01-16 LAB — URINE CULTURE: SPECIAL REQUESTS: NORMAL

## 2017-01-17 ENCOUNTER — Telehealth: Payer: Self-pay | Admitting: Emergency Medicine

## 2017-01-17 NOTE — Telephone Encounter (Signed)
Post ED Visit - Positive Culture Follow-up: Successful Patient Follow-Up  Culture assessed and recommendations reviewed by: []  Elenor Quinones, Pharm.D. [x]  Heide Guile, Pharm.D., BCPS AQ-ID []  Parks Neptune, Pharm.D., BCPS []  Alycia Rossetti, Pharm.D., BCPS []  [place], [place].D., BCPS, AAHIVP []  Legrand Como, Pharm.D., BCPS, AAHIVP []  Salome Arnt, PharmD, BCPS []  Dimitri Ped, PharmD, BCPS []  Vincenza Hews, PharmD, BCPS  Positive urine culture  []  Patient discharged without antimicrobial prescription and treatment is now indicated []  Organism is resistant to prescribed ED discharge antimicrobial []  Patient with positive blood cultures  Changes discussed with ED provider: Delia Heady [facility] New antibiotic prescription stop cephalexin, presumed contaminant  Attempting to contact patient   Hazle Nordmann 01/17/2017, 1:23 PM

## 2017-01-18 ENCOUNTER — Telehealth: Payer: Self-pay | Admitting: *Deleted

## 2017-01-18 NOTE — Telephone Encounter (Signed)
Left message that I returned her call

## 2017-02-21 ENCOUNTER — Telehealth: Payer: Self-pay | Admitting: Adult Health

## 2017-02-21 NOTE — Telephone Encounter (Signed)
Left message x 1. JSY 

## 2017-02-21 NOTE — Telephone Encounter (Signed)
Patient called stating that she would like a refill of her clo med. Pt states she doesn't need to discuss anything with Anderson [place] for her to schedule and appointment. She just needs this medication

## 2017-02-22 NOTE — Telephone Encounter (Signed)
Left message x 2 with other contact #. [place]

## 2017-02-22 NOTE — Telephone Encounter (Signed)
Spoke with female at [phone] and she don't know this pt. Not correct # for pt. No other # listed. Encounter closed. [place]

## 2017-02-23 ENCOUNTER — Telehealth: Payer: Self-pay | Admitting: Adult Health

## 2017-02-23 DIAGNOSIS — Z319 Encounter for procreative management, unspecified: Secondary | ICD-10-CM

## 2017-02-23 NOTE — Telephone Encounter (Signed)
Had period 11/2 will check progesterone 11/23, if not ovulating will start clomid at her request

## 2017-02-23 NOTE — Telephone Encounter (Signed)
Pt called stating that she had spoken to [place] at her last appointment about clomid. She states that she is ready to get on that and would like to get things started now. She requests that a prescription be sent in. Advised pt that I would send her request to [facility] and she should check with her pharmacy in the next 24 hours.

## 2017-02-25 ENCOUNTER — Other Ambulatory Visit: Payer: Self-pay

## 2017-02-25 ENCOUNTER — Encounter (HOSPITAL_COMMUNITY): Payer: Self-pay | Admitting: Emergency Medicine

## 2017-02-25 ENCOUNTER — Emergency Department (HOSPITAL_COMMUNITY)
Admission: EM | Admit: 2017-02-25 | Discharge: 2017-02-25 | Disposition: A | Discharge status: Home / Self Care | Payer: Medicaid Other | Attending: Emergency Medicine | Admitting: Emergency Medicine

## 2017-02-25 DIAGNOSIS — J45909 Unspecified asthma, uncomplicated: Secondary | ICD-10-CM | POA: Diagnosis not present

## 2017-02-25 DIAGNOSIS — Z79899 Other long term (current) drug therapy: Secondary | ICD-10-CM | POA: Insufficient documentation

## 2017-02-25 DIAGNOSIS — R110 Nausea: Secondary | ICD-10-CM | POA: Insufficient documentation

## 2017-02-25 DIAGNOSIS — R197 Diarrhea, unspecified: Secondary | ICD-10-CM | POA: Insufficient documentation

## 2017-02-25 DIAGNOSIS — Z87891 Personal history of nicotine dependence: Secondary | ICD-10-CM | POA: Insufficient documentation

## 2017-02-25 DIAGNOSIS — R1110 Vomiting, unspecified: Secondary | ICD-10-CM | POA: Insufficient documentation

## 2017-02-25 LAB — CBC WITH DIFFERENTIAL/PLATELET
BASOS ABS: 0 10*3/uL (ref 0.0–0.1)
Basophils Relative: 0 %
EOS ABS: 0.1 10*3/uL (ref 0.0–0.7)
EOS PCT: 0 %
HCT: 40.9 % (ref 36.0–46.0)
HEMOGLOBIN: 13.6 g/dL (ref 12.0–15.0)
LYMPHS PCT: 20 %
Lymphs Abs: 3 10*3/uL (ref 0.7–4.0)
MCH: 31.3 pg (ref 26.0–34.0)
MCHC: 33.3 g/dL (ref 30.0–36.0)
MCV: 94 fL (ref 78.0–100.0)
Monocytes Absolute: 0.6 10*3/uL (ref 0.1–1.0)
Monocytes Relative: 4 %
NEUTROS PCT: 76 %
Neutro Abs: 11.2 10*3/uL — ABNORMAL HIGH (ref 1.7–7.7)
PLATELETS: 308 10*3/uL (ref 150–400)
RBC: 4.35 MIL/uL (ref 3.87–5.11)
RDW: 13.6 % (ref 11.5–15.5)
WBC: 14.8 10*3/uL — AB (ref 4.0–10.5)

## 2017-02-25 LAB — I-STAT BETA HCG BLOOD, ED (MC, WL, AP ONLY)

## 2017-02-25 LAB — LIPASE, BLOOD: LIPASE: 107 U/L — AB (ref 11–51)

## 2017-02-25 LAB — COMPREHENSIVE METABOLIC PANEL
ALK PHOS: 69 U/L (ref 38–126)
ALT: 20 U/L (ref 14–54)
ANION GAP: 7 (ref 5–15)
AST: 30 U/L (ref 15–41)
Albumin: 4.5 g/dL (ref 3.5–5.0)
BUN: 8 mg/dL (ref 6–20)
CO2: 24 mmol/L (ref 22–32)
Calcium: 10.1 mg/dL (ref 8.9–10.3)
Chloride: 107 mmol/L (ref 101–111)
Creatinine, Ser: 0.63 mg/dL (ref 0.44–1.00)
Glucose, Bld: 95 mg/dL (ref 65–99)
POTASSIUM: 4.5 mmol/L (ref 3.5–5.1)
SODIUM: 138 mmol/L (ref 135–145)
TOTAL PROTEIN: 7.6 g/dL (ref 6.5–8.1)
Total Bilirubin: 0.3 mg/dL (ref 0.3–1.2)

## 2017-02-25 MED ORDER — ONDANSETRON 4 MG PO TBDP: ORAL_TABLET | ORAL | 0 refills | Status: DC

## 2017-02-25 MED ORDER — LORAZEPAM 2 MG/ML IJ SOLN
INTRAMUSCULAR | Status: AC
  Filled 2017-02-25: qty 1

## 2017-02-25 MED ORDER — LORAZEPAM 2 MG/ML IJ SOLN
1.0000 mg | Freq: Once | INTRAMUSCULAR | Status: AC
  Administered 2017-02-25: 1 mg via INTRAVENOUS

## 2017-02-25 MED ORDER — SODIUM CHLORIDE 0.9 % IV BOLUS (SEPSIS)
1000.0000 mL | Freq: Once | INTRAVENOUS | Status: AC
  Administered 2017-02-25: 1000 mL via INTRAVENOUS

## 2017-02-25 MED ORDER — HYDROXYZINE HCL 25 MG PO TABS: 25.0000 mg | ORAL_TABLET | Freq: Four times a day (QID) | ORAL | 0 refills | Status: DC | PRN

## 2017-02-25 MED ORDER — ONDANSETRON 4 MG PO TBDP
ORAL_TABLET | ORAL | 0 refills | Status: DC
Start: 2017-02-25 — End: 2017-04-14

## 2017-02-25 MED ORDER — ONDANSETRON HCL 4 MG/2ML IJ SOLN
4.0000 mg | Freq: Once | INTRAMUSCULAR | Status: AC
  Administered 2017-02-25: 4 mg via INTRAVENOUS
  Filled 2017-02-25: qty 2

## 2017-02-25 MED ORDER — PANTOPRAZOLE SODIUM 40 MG IV SOLR
40.0000 mg | Freq: Once | INTRAVENOUS | Status: AC
  Administered 2017-02-25: 40 mg via INTRAVENOUS
  Filled 2017-02-25: qty 40

## 2017-02-25 MED ORDER — KETOROLAC TROMETHAMINE 30 MG/ML IJ SOLN
30.0000 mg | Freq: Once | INTRAMUSCULAR | Status: AC
  Administered 2017-02-25: 30 mg via INTRAVENOUS
  Filled 2017-02-25: qty 1

## 2017-02-25 NOTE — ED Notes (Addendum)
Pt getting up out of bed. Crawling in bed on knees,. Disrobing. Unable to redirect. Order for ativan 1 mg obtained and adm IV

## 2017-02-25 NOTE — ED Notes (Signed)
As soon as Probation officer started IV medications, patient appeared to fall asleep.

## 2017-02-25 NOTE — ED Triage Notes (Signed)
Pt reports severe nausea since this morning with dry heaves.

## 2017-02-25 NOTE — ED Notes (Addendum)
When writer went into room to speak with patient about plan of care, patient would not verbally respond to Probation officer. Writer had to ask patient multiple times to turn over so writer could start IV to give medications. Patient would not follow instructions. Writer informed charge nurse, charge nurse into room to see patient and had to encourage patient to turn over multiple times.  Patient would not sit still while writer was attempting to start IV. Writer had to have assistance to start IV due to patient trying to grab writers arms and hands while starting IV. Patient will not follow commands by Probation officer or charge nurse. Room has foul odor of marijuana when walked into room. Patient denies smoking marijuana.

## 2017-02-25 NOTE — ED Provider Notes (Signed)
[facility] EMERGENCY DEPARTMENT Provider Note   CSN: [id number] Arrival date & time: 02/25/17  1503     History   Chief Complaint Chief Complaint  Patient presents with  . Nausea    HPI Janet Dixon is a 31 y.o. female.  Patient complains of nausea and vomiting and diarrhea   The history is provided by the patient.  Emesis   This is a new problem. The current episode started 12 to 24 hours ago. The problem occurs 2 to 4 times per day. The problem has not changed since onset.The emesis has an appearance of stomach contents. There has been no fever. Associated symptoms include diarrhea. Pertinent negatives include no abdominal pain, no cough and no headaches.    Past Medical History:  Diagnosis Date  . Asthma   . History of chlamydia   . History of gonorrhea   . History of trichomoniasis   . Seizures [facility])     Patient Active Problem List   Diagnosis Date Noted  . Pregnancy examination or test, negative result 12/07/2016  . Missed period 12/07/2016  . Hematuria 12/07/2016  . History of trichomoniasis 12/07/2016  . History of chlamydia 12/07/2016  . Patient desires pregnancy 12/07/2016  . Chlamydia 09/07/2016  . Papanicolaou smear of cervix with positive high risk human papilloma virus (HPV) test 09/07/2016  . Trichimoniasis 08/29/2016  . Vaginal discharge 08/29/2016  . Encounter for routine gynecological examination with Papanicolaou smear of cervix 08/29/2016  . Status post cesarean section 12/02/2014  . Urinary tract infection with hematuria 11/30/2014  . Seizure disorder ([place]) 05/01/2014  . Cocaine abuse ([place]) 03/14/2014    Past Surgical History:  Procedure Laterality Date  . CESAREAN SECTION    . REPEAT CESAREAN SECTION N/A 12/02/2014   Performed by Jonnie Kind, MD at [facility] ORS    OB History    Gravida Para Term Preterm AB Living   6 6 6    0 2   SAB TAB Ectopic Multiple Live Births   0     0 2       Home Medications    Prior to Admission  medications   Medication Sig Start Date End Date Taking? Authorizing Provider  albuterol (PROVENTIL HFA;VENTOLIN HFA) 108 (90 Base) MCG/ACT inhaler Inhale 2 puffs into the lungs every 6 (six) hours as needed for wheezing or shortness of breath. 08/29/16   Estill Dooms, NP  cephALEXin (KEFLEX) 500 MG capsule Take 1 capsule (500 mg total) by mouth 3 (three) times daily. 01/14/17   Rolland Porter, MD  dicyclomine (BENTYL) 20 MG tablet Take 1 tablet (20 mg total) by mouth 4 (four) times daily -  before meals and at bedtime. 01/14/17   Rolland Porter, MD  hydrOXYzine (ATARAX/VISTARIL) 25 MG tablet Take 1 tablet (25 mg total) every 6 (six) hours as needed by mouth for anxiety (nerves). 02/25/17   Milton Ferguson, MD  hydrOXYzine (ATARAX/VISTARIL) 25 MG tablet Take 1 tablet (25 mg total) every 6 (six) hours as needed by mouth for anxiety (nerves). 02/25/17   Milton Ferguson, MD  ondansetron (ZOFRAN ODT) 4 MG disintegrating tablet 65m ODT q4 hours prn nausea/vomit 02/25/17   ZMilton Ferguson MD  ondansetron (ZOFRAN ODT) 4 MG disintegrating tablet 425mODT q4 hours prn nausea/vomit 02/25/17   ZaMilton FergusonMD  ondansetron (ZOFRAN ODT) 4 MG disintegrating tablet 25m57mDT q4 hours prn nausea/vomit 02/25/17   ZamMilton FergusonD  ondansetron (ZOFRAN ODT) 4 MG disintegrating tablet 25mg425mT q4 hours  prn nausea/vomit 02/25/17   Milton Ferguson, MD  ondansetron (ZOFRAN) 4 MG tablet Take 1 tablet (4 mg total) by mouth every 8 (eight) hours as needed for nausea or vomiting. 01/14/17   Rolland Porter, MD  Prenatal Vit-Fe Fumarate-FA (PRENATAL PLUS/IRON) 27-1 MG TABS Take 1 daily 12/07/16   Estill Dooms, NP    Family History Family History  Problem Relation Age of Onset  . Hypertension Mother   . Asthma Brother   . Asthma Daughter   . Eczema Daughter   . Asthma Son   . Eczema Son   . Aneurysm Maternal Grandmother   . Heart disease Maternal Grandmother   . Cancer Maternal Grandmother   . Asthma Daughter   . Eczema  Daughter   . Asthma Son   . Eczema Son   . Eczema Son   . Cancer Maternal Grandfather        colon  . Eczema Maternal Aunt     Social History Social History   Tobacco Use  . Smoking status: Former Smoker    Years: 4.00    Types: Cigarettes  . Smokeless tobacco: Never Used  . Tobacco comment: 2 per day  Substance Use Topics  . Alcohol use: No    Comment: none x 1 month ago  . Drug use: No    Comment: last used 12/24/16     Allergies   Macrobid [nitrofurantoin macrocrystal]; Diphenhydramine hcl; Ibuprofen; and Flexeril [cyclobenzaprine hcl]   Review of Systems Review of Systems  Constitutional: Negative for appetite change and fatigue.  HENT: Negative for congestion, ear discharge and sinus pressure.   Eyes: Negative for discharge.  Respiratory: Negative for cough.   Cardiovascular: Negative for chest pain.  Gastrointestinal: Positive for diarrhea and vomiting. Negative for abdominal pain.  Genitourinary: Negative for frequency and hematuria.  Musculoskeletal: Negative for back pain.  Skin: Negative for rash.  Neurological: Negative for seizures and headaches.  Psychiatric/Behavioral: Negative for hallucinations.     Physical Exam Updated Vital Signs BP 128/78   Pulse 69   Temp 97.8 F (36.6 C) (Temporal)   Resp 18   Ht 5' 5"  (1.651 m)   Wt 54.4 kg (120 lb)   LMP 02/09/2017   SpO2 98%   BMI 19.97 kg/m   Physical Exam  Constitutional: She is oriented to person, place, and time. She appears well-developed.  HENT:  Head: Normocephalic.  Eyes: Conjunctivae and EOM are normal. No scleral icterus.  Neck: Neck supple. No thyromegaly present.  Cardiovascular: Normal rate and regular rhythm. Exam reveals no gallop and no friction rub.  No murmur heard. Pulmonary/Chest: No stridor. She has no wheezes. She has no rales. She exhibits no tenderness.  Abdominal: She exhibits no distension. There is no tenderness. There is no rebound.  Musculoskeletal: Normal range  of motion. She exhibits no edema.  Lymphadenopathy:    She has no cervical adenopathy.  Neurological: She is oriented to person, place, and time. She exhibits normal muscle tone. Coordination normal.  Skin: No rash noted. No erythema.  Psychiatric: She has a normal mood and affect. Her behavior is normal.     ED Treatments / Results  Labs (all labs ordered are listed, but only abnormal results are displayed) Labs Reviewed  CBC WITH DIFFERENTIAL/PLATELET - Abnormal; Notable for the following components:      Result Value   WBC 14.8 (*)    Neutro Abs 11.2 (*)    All other components within normal limits  LIPASE, BLOOD -  Abnormal; Notable for the following components:   Lipase 107 (*)    All other components within normal limits  COMPREHENSIVE METABOLIC PANEL  I-STAT BETA HCG BLOOD, ED ([facility], [facility], [facility] ONLY)  I-STAT BETA HCG BLOOD, ED ([facility], [facility], [facility] ONLY)    EKG  EKG Interpretation None       Radiology No results found.  Procedures Procedures (including critical care time)  Medications Ordered in ED Medications  sodium chloride 0.9 % bolus 1,000 mL (0 mLs Intravenous Stopped 02/25/17 1841)  ondansetron (ZOFRAN) injection 4 mg (4 mg Intravenous Given 02/25/17 1548)  pantoprazole (PROTONIX) injection 40 mg (40 mg Intravenous Given 02/25/17 1548)  ketorolac (TORADOL) 30 MG/ML injection 30 mg (30 mg Intravenous Given 02/25/17 1548)  LORazepam (ATIVAN) injection 1 mg (1 mg Intravenous Given 02/25/17 1629)  ondansetron (ZOFRAN) injection 4 mg (4 mg Intravenous Given 02/25/17 1748)     Initial Impression / Assessment and Plan / ED Course  I have reviewed the triage vital signs and the nursing notes.  Pertinent labs & imaging results that were available during my care of the patient were reviewed by me and considered in my medical decision making   Patient had no diarrhea in the emergency department minimal vomiting which has improved with fluids and nausea medicine.  Patient has  mildly elevated lipase which she had last time.  She will be discharged home with nausea medicine and Vistaril for anxiety and will follow up with the PCP next week  Final Clinical Impressions(s) / ED Diagnoses   Final diagnoses:  Nausea    ED Discharge Orders        Ordered    ondansetron (ZOFRAN ODT) 4 MG disintegrating tablet     02/25/17 2013    ondansetron (ZOFRAN ODT) 4 MG disintegrating tablet     02/25/17 2013    hydrOXYzine (ATARAX/VISTARIL) 25 MG tablet  Every 6 hours PRN     02/25/17 2013    ondansetron (ZOFRAN ODT) 4 MG disintegrating tablet     02/25/17 2014    ondansetron (ZOFRAN ODT) 4 MG disintegrating tablet     02/25/17 2015    hydrOXYzine (ATARAX/VISTARIL) 25 MG tablet  Every 6 hours PRN     02/25/17 2015       Milton Ferguson, MD 02/25/17 2020

## 2017-02-25 NOTE — ED Notes (Signed)
Patient refused to get out of bed and be discharged, had to dress patient and placed in wheelchair and taken to waiting room for ride, while security on sideby.

## 2017-02-25 NOTE — Discharge Instructions (Signed)
Take liquids only for the next 24 hours and follow-up with a family doctor this week if not improving.  Take Tylenol or Motrin for pain

## 2017-02-25 NOTE — ED Notes (Signed)
Patient is trashing around in room and dry heaving. Patient will not leave gown on or follow instructions. EDP made aware.

## 2017-03-01 MED FILL — Ondansetron HCl Tab 4 MG: ORAL | Qty: 4 | Status: AC

## 2017-03-06 ENCOUNTER — Telehealth: Payer: Self-pay | Admitting: Emergency Medicine

## 2017-03-06 ENCOUNTER — Telehealth: Payer: Self-pay | Admitting: Adult Health

## 2017-03-06 NOTE — Telephone Encounter (Signed)
Lost to followup 

## 2017-03-06 NOTE — Telephone Encounter (Signed)
Patient is requesting Zofran for nausea. Informed patient that zofran is not generally prescribed in the first trimester d/t risks of heart defects. Patient also states she was told she has pancreatitis and wants referral sent to GI doctor. Please advise on both. Also, phone is not working, spoke to patient in office. Pharmacy on file is correct.

## 2017-03-07 LAB — PROGESTERONE: PROGESTERONE: 12.6 ng/mL

## 2017-03-07 NOTE — Telephone Encounter (Signed)
No voice mail, if she calls progesterone level is good,

## 2017-03-08 ENCOUNTER — Telehealth: Payer: Self-pay | Admitting: *Deleted

## 2017-03-08 NOTE — Telephone Encounter (Signed)
Attempted to call pt x2. Phone not working.

## 2017-03-08 NOTE — Telephone Encounter (Signed)
VM not set up.

## 2017-04-14 ENCOUNTER — Ambulatory Visit (INDEPENDENT_AMBULATORY_CARE_PROVIDER_SITE_OTHER): Payer: Medicaid Other | Admitting: Adult Health

## 2017-04-14 ENCOUNTER — Encounter: Payer: Self-pay | Admitting: Adult Health

## 2017-04-14 VITALS — BP 110/60 | HR 86 | Ht 61.0 in | Wt 119.5 lb

## 2017-04-14 DIAGNOSIS — R748 Abnormal levels of other serum enzymes: Secondary | ICD-10-CM | POA: Diagnosis not present

## 2017-04-14 DIAGNOSIS — N926 Irregular menstruation, unspecified: Secondary | ICD-10-CM

## 2017-04-14 DIAGNOSIS — Z3A01 Less than 8 weeks gestation of pregnancy: Secondary | ICD-10-CM | POA: Insufficient documentation

## 2017-04-14 DIAGNOSIS — R109 Unspecified abdominal pain: Secondary | ICD-10-CM | POA: Diagnosis not present

## 2017-04-14 DIAGNOSIS — Z98891 History of uterine scar from previous surgery: Secondary | ICD-10-CM

## 2017-04-14 DIAGNOSIS — O3680X0 Pregnancy with inconclusive fetal viability, not applicable or unspecified: Secondary | ICD-10-CM

## 2017-04-14 DIAGNOSIS — Z3201 Encounter for pregnancy test, result positive: Secondary | ICD-10-CM | POA: Insufficient documentation

## 2017-04-14 LAB — POCT URINE PREGNANCY: Preg Test, Ur: POSITIVE — AB

## 2017-04-14 MED ORDER — FLINTSTONES COMPLETE 60 MG PO CHEW: CHEWABLE_TABLET | ORAL | Status: DC

## 2017-04-14 NOTE — Progress Notes (Signed)
Subjective:     Patient ID: ANEKA FAGERSTROM, female   DOB: 04-13-1985, 32 y.o.   MRN: [id number]  HPI Roseline is a 32 year old black female in for UPT, has missed a period and had +HPT.She has had 6 C-sections in past.  Review of Systems +missed period, with +HPT +stomach pain, has had elevated lipase level in ER Denies any nausea, vomiting or diarrhea   Reviewed past medical,surgical, social and family history. Reviewed medications and allergies.     Objective:   Physical Exam BP 110/60 (BP Location: Left Arm, Patient Position: Sitting, Cuff Size: Normal)   Pulse 86   Ht 5' 1"  (1.549 m)   Wt 119 lb 8 oz (54.2 kg)   LMP 03/11/2017   BMI 22.58 kg/m UPT +, about 4+6 weeks by LMP with EDD 12/16/17,Skin warm and dry. Neck: mid line trachea, normal thyroid, good ROM, no lymphadenopathy noted. Lungs: clear to ausculation bilaterally. Cardiovascular: regular rate and rhythm.Abdomen is soft, has tenderness epigastric are a, no HSM noted.  Assessment:     1. Pregnancy examination or test, positive result   2. Less than [redacted] weeks gestation of pregnancy   3. Encounter to determine fetal viability of pregnancy, single or unspecified fetus   4. Stomach pain   5. Elevated lipase   6. History of C-section       Plan:    Take 2 [place] daily  Check lipase level Return in 2 weeks for dating [name] Review handouts on First trimester and by [facility]

## 2017-04-14 NOTE — Patient Instructions (Signed)
First Trimester of Pregnancy The first trimester of pregnancy is from week 1 until the end of week 13 (months 1 through 3). A week after a sperm fertilizes an egg, the egg will implant on the wall of the uterus. This embryo will begin to develop into a baby. Genes from you and your partner will form the baby. The female genes will determine whether the baby will be a boy or a girl. At 6-8 weeks, the eyes and face will be formed, and the heartbeat can be seen on ultrasound. At the end of 12 weeks, all the baby's organs will be formed. Now that you are pregnant, you will want to do everything you can to have a healthy baby. Two of the most important things are to get good prenatal care and to follow your health care provider's instructions. Prenatal care is all the medical care you receive before the baby's birth. This care will help prevent, find, and treat any problems during the pregnancy and childbirth. Body changes during your first trimester Your body goes through many changes during pregnancy. The changes vary from woman to woman.  You may gain or lose a couple of pounds at first.  You may feel sick to your stomach (nauseous) and you may throw up (vomit). If the vomiting is uncontrollable, call your health care provider.  You may tire easily.  You may develop headaches that can be relieved by medicines. All medicines should be approved by your health care provider.  You may urinate more often. Painful urination may mean you have a bladder infection.  You may develop heartburn as a result of your pregnancy.  You may develop constipation because certain hormones are causing the muscles that push stool through your intestines to slow down.  You may develop hemorrhoids or swollen veins (varicose veins).  Your breasts may begin to grow larger and become tender. Your nipples may stick out more, and the tissue that surrounds them (areola) may become darker.  Your gums may bleed and may be  sensitive to brushing and flossing.  Dark spots or blotches (chloasma, mask of pregnancy) may develop on your face. This will likely fade after the baby is born.  Your menstrual periods will stop.  You may have a loss of appetite.  You may develop cravings for certain kinds of food.  You may have changes in your emotions from day to day, such as being excited to be pregnant or being concerned that something may go wrong with the pregnancy and baby.  You may have more vivid and strange dreams.  You may have changes in your hair. These can include thickening of your hair, rapid growth, and changes in texture. Some women also have hair loss during or after pregnancy, or hair that feels dry or thin. Your hair will most likely return to normal after your baby is born.  What to expect at prenatal visits During a routine prenatal visit:  You will be weighed to make sure you and the baby are growing normally.  Your blood pressure will be taken.  Your abdomen will be measured to track your baby's growth.  The fetal heartbeat will be listened to between weeks 10 and 14 of your pregnancy.  Test results from any previous visits will be discussed.  Your health care provider may ask you:  How you are feeling.  If you are feeling the baby move.  If you have had any abnormal symptoms, such as leaking fluid, bleeding, severe headaches,  or abdominal cramping.  If you are using any tobacco products, including cigarettes, chewing tobacco, and electronic cigarettes.  If you have any questions.  Other tests that may be performed during your first trimester include:  Blood tests to find your blood type and to check for the presence of any previous infections. The tests will also be used to check for low iron levels (anemia) and protein on red blood cells (Rh antibodies). Depending on your risk factors, or if you previously had diabetes during pregnancy, you may have tests to check for high blood  sugar that affects pregnant women (gestational diabetes).  Urine tests to check for infections, diabetes, or protein in the urine.  An ultrasound to confirm the proper growth and development of the baby.  Fetal screens for spinal cord problems (spina bifida) and Down syndrome.  HIV (human immunodeficiency virus) testing. Routine prenatal testing includes screening for HIV, unless you choose not to have this test.  You may need other tests to make sure you and the baby are doing well.  Follow these instructions at home: Medicines  Follow your health care provider's instructions regarding medicine use. Specific medicines may be either safe or unsafe to take during pregnancy.  Take a prenatal vitamin that contains at least 600 micrograms (mcg) of folic acid.  If you develop constipation, try taking a stool softener if your health care provider approves. Eating and drinking  Eat a balanced diet that includes fresh fruits and vegetables, whole grains, good sources of protein such as meat, eggs, or tofu, and low-fat dairy. Your health care provider will help you determine the amount of weight gain that is right for you.  Avoid raw meat and uncooked cheese. These carry germs that can cause birth defects in the baby.  Eating four or five small meals rather than three large meals a day may help relieve nausea and vomiting. If you start to feel nauseous, eating a few soda crackers can be helpful. Drinking liquids between meals, instead of during meals, also seems to help ease nausea and vomiting.  Limit foods that are high in fat and processed sugars, such as fried and sweet foods.  To prevent constipation: ? Eat foods that are high in fiber, such as fresh fruits and vegetables, whole grains, and beans. ? Drink enough fluid to keep your urine clear or pale yellow. Activity  Exercise only as directed by your health care provider. Most women can continue their usual exercise routine during  pregnancy. Try to exercise for 30 minutes at least 5 days a week. Exercising will help you: ? Control your weight. ? Stay in shape. ? Be prepared for labor and delivery.  Experiencing pain or cramping in the lower abdomen or lower back is a good sign that you should stop exercising. Check with your health care provider before continuing with normal exercises.  Try to avoid standing for long periods of time. Move your legs often if you must stand in one place for a long time.  Avoid heavy lifting.  Wear low-heeled shoes and practice good posture.  You may continue to have sex unless your health care provider tells you not to. Relieving pain and discomfort  Wear a good support bra to relieve breast tenderness.  Take warm sitz baths to soothe any pain or discomfort caused by hemorrhoids. Use hemorrhoid cream if your health care provider approves.  Rest with your legs elevated if you have leg cramps or low back pain.  If you develop  varicose veins in your legs, wear support hose. Elevate your feet for 15 minutes, 3-4 times a day. Limit salt in your diet. Prenatal care  Schedule your prenatal visits by the twelfth week of pregnancy. They are usually scheduled monthly at first, then more often in the last 2 months before delivery.  Write down your questions. Take them to your prenatal visits.  Keep all your prenatal visits as told by your health care provider. This is important. Safety  Wear your seat belt at all times when driving.  Make a list of emergency phone numbers, including numbers for family, friends, the hospital, and police and fire departments. General instructions  Ask your health care provider for a referral to a local prenatal education class. Begin classes no later than the beginning of month 6 of your pregnancy.  Ask for help if you have counseling or nutritional needs during pregnancy. Your health care provider can offer advice or refer you to specialists for help  with various needs.  Do not use hot tubs, steam rooms, or saunas.  Do not douche or use tampons or scented sanitary pads.  Do not cross your legs for long periods of time.  Avoid cat litter boxes and soil used by cats. These carry germs that can cause birth defects in the baby and possibly loss of the fetus by miscarriage or stillbirth.  Avoid all smoking, herbs, alcohol, and medicines not prescribed by your health care provider. Chemicals in these products affect the formation and growth of the baby.  Do not use any products that contain nicotine or tobacco, such as cigarettes and e-cigarettes. If you need help quitting, ask your health care provider. You may receive counseling support and other resources to help you quit.  Schedule a dentist appointment. At home, brush your teeth with a soft toothbrush and be gentle when you floss. Contact a health care provider if:  You have dizziness.  You have mild pelvic cramps, pelvic pressure, or nagging pain in the abdominal area.  You have persistent nausea, vomiting, or diarrhea.  You have a bad smelling vaginal discharge.  You have pain when you urinate.  You notice increased swelling in your face, hands, legs, or ankles.  You are exposed to fifth disease or chickenpox.  You are exposed to [name] measles (rubella) and have never had it. Get help right away if:  You have a fever.  You are leaking fluid from your vagina.  You have spotting or bleeding from your vagina.  You have severe abdominal cramping or pain.  You have rapid weight gain or loss.  You vomit blood or material that looks like coffee grounds.  You develop a severe headache.  You have shortness of breath.  You have any kind of trauma, such as from a fall or a car accident. Summary  The first trimester of pregnancy is from week 1 until the end of week 13 (months 1 through 3).  Your body goes through many changes during pregnancy. The changes vary from  woman to woman.  You will have routine prenatal visits. During those visits, your health care provider will examine you, discuss any test results you may have, and talk with you about how you are feeling. This information is not intended to replace advice given to you by your health care provider. Make sure you discuss any questions you have with your health care provider. Document Released: 03/22/2001 Document Revised: 03/09/2016 Document Reviewed: 03/09/2016 Elsevier Interactive Patient Education  2018 [employer]  Inc.

## 2017-04-16 ENCOUNTER — Emergency Department (HOSPITAL_COMMUNITY): Payer: Medicaid Other

## 2017-04-16 ENCOUNTER — Other Ambulatory Visit: Payer: Self-pay

## 2017-04-16 ENCOUNTER — Encounter (HOSPITAL_COMMUNITY): Payer: Self-pay | Admitting: Emergency Medicine

## 2017-04-16 ENCOUNTER — Inpatient Hospital Stay (HOSPITAL_COMMUNITY)
Admission: EM | Admit: 2017-04-16 | Discharge: 2017-04-16 | DRG: 833 | Discharge status: Against Medical Advice | Payer: Medicaid Other | Attending: Obstetrics and Gynecology | Admitting: Obstetrics and Gynecology

## 2017-04-16 DIAGNOSIS — O280 Abnormal hematological finding on antenatal screening of mother: Secondary | ICD-10-CM | POA: Diagnosis present

## 2017-04-16 DIAGNOSIS — O3680X0 Pregnancy with inconclusive fetal viability, not applicable or unspecified: Secondary | ICD-10-CM | POA: Diagnosis present

## 2017-04-16 DIAGNOSIS — O210 Mild hyperemesis gravidarum: Secondary | ICD-10-CM

## 2017-04-16 DIAGNOSIS — R109 Unspecified abdominal pain: Secondary | ICD-10-CM | POA: Diagnosis present

## 2017-04-16 DIAGNOSIS — O99340 Other mental disorders complicating pregnancy, unspecified trimester: Secondary | ICD-10-CM | POA: Diagnosis present

## 2017-04-16 DIAGNOSIS — Z8669 Personal history of other diseases of the nervous system and sense organs: Secondary | ICD-10-CM | POA: Diagnosis not present

## 2017-04-16 DIAGNOSIS — O34219 Maternal care for unspecified type scar from previous cesarean delivery: Secondary | ICD-10-CM | POA: Diagnosis present

## 2017-04-16 DIAGNOSIS — R825 Elevated urine levels of drugs, medicaments and biological substances: Secondary | ICD-10-CM | POA: Diagnosis present

## 2017-04-16 DIAGNOSIS — O99519 Diseases of the respiratory system complicating pregnancy, unspecified trimester: Secondary | ICD-10-CM | POA: Diagnosis present

## 2017-04-16 DIAGNOSIS — F1410 Cocaine abuse, uncomplicated: Secondary | ICD-10-CM | POA: Diagnosis present

## 2017-04-16 DIAGNOSIS — R451 Restlessness and agitation: Secondary | ICD-10-CM | POA: Diagnosis present

## 2017-04-16 DIAGNOSIS — R748 Abnormal levels of other serum enzymes: Secondary | ICD-10-CM | POA: Diagnosis present

## 2017-04-16 DIAGNOSIS — Z8619 Personal history of other infectious and parasitic diseases: Secondary | ICD-10-CM | POA: Diagnosis not present

## 2017-04-16 DIAGNOSIS — O99330 Smoking (tobacco) complicating pregnancy, unspecified trimester: Secondary | ICD-10-CM | POA: Diagnosis present

## 2017-04-16 DIAGNOSIS — Z79899 Other long term (current) drug therapy: Secondary | ICD-10-CM | POA: Diagnosis not present

## 2017-04-16 DIAGNOSIS — Z3A01 Less than 8 weeks gestation of pregnancy: Secondary | ICD-10-CM

## 2017-04-16 DIAGNOSIS — O219 Vomiting of pregnancy, unspecified: Principal | ICD-10-CM | POA: Diagnosis present

## 2017-04-16 DIAGNOSIS — J45909 Unspecified asthma, uncomplicated: Secondary | ICD-10-CM | POA: Diagnosis present

## 2017-04-16 DIAGNOSIS — N858 Other specified noninflammatory disorders of uterus: Secondary | ICD-10-CM | POA: Diagnosis present

## 2017-04-16 DIAGNOSIS — Z3490 Encounter for supervision of normal pregnancy, unspecified, unspecified trimester: Secondary | ICD-10-CM

## 2017-04-16 DIAGNOSIS — Z5321 Procedure and treatment not carried out due to patient leaving prior to being seen by health care provider: Secondary | ICD-10-CM | POA: Diagnosis not present

## 2017-04-16 DIAGNOSIS — R52 Pain, unspecified: Secondary | ICD-10-CM

## 2017-04-16 DIAGNOSIS — O26899 Other specified pregnancy related conditions, unspecified trimester: Secondary | ICD-10-CM | POA: Diagnosis present

## 2017-04-16 LAB — COMPREHENSIVE METABOLIC PANEL
ALK PHOS: 72 U/L (ref 38–126)
ALT: 17 U/L (ref 14–54)
AST: 27 U/L (ref 15–41)
Albumin: 4.7 g/dL (ref 3.5–5.0)
Anion gap: 9 (ref 5–15)
BUN: 9 mg/dL (ref 6–20)
CALCIUM: 9.8 mg/dL (ref 8.9–10.3)
CHLORIDE: 110 mmol/L (ref 101–111)
CO2: 23 mmol/L (ref 22–32)
CREATININE: 0.67 mg/dL (ref 0.44–1.00)
GFR calc Af Amer: >60 mL/min (ref >60)
Glucose, Bld: 117 mg/dL — ABNORMAL HIGH (ref 65–99)
Potassium: 3.9 mmol/L (ref 3.5–5.1)
Sodium: 142 mmol/L (ref 135–145)
Total Bilirubin: 0.3 mg/dL (ref 0.3–1.2)
Total Protein: 8.4 g/dL — ABNORMAL HIGH (ref 6.5–8.1)

## 2017-04-16 LAB — ETHANOL

## 2017-04-16 LAB — HCG, QUANTITATIVE, PREGNANCY: HCG, BETA CHAIN, QUANT, S: 2636 m[IU]/mL — AB (ref <5)

## 2017-04-16 LAB — LIPASE, BLOOD: LIPASE: 186 U/L — AB (ref 11–51)

## 2017-04-16 LAB — RAPID URINE DRUG SCREEN, HOSP PERFORMED
AMPHETAMINES: NOT DETECTED
BARBITURATES: NOT DETECTED
Benzodiazepines: NOT DETECTED
Cocaine: POSITIVE — AB
Opiates: NOT DETECTED
TETRAHYDROCANNABINOL: POSITIVE — AB

## 2017-04-16 LAB — URINALYSIS, ROUTINE W REFLEX MICROSCOPIC
Bilirubin Urine: NEGATIVE
GLUCOSE, UA: NEGATIVE mg/dL
HGB URINE DIPSTICK: NEGATIVE
KETONES UR: 20 mg/dL — AB
Leukocytes, UA: NEGATIVE
Nitrite: NEGATIVE
PROTEIN: NEGATIVE mg/dL
Specific Gravity, Urine: 1.019 (ref 1.005–1.030)
pH: 5 (ref 5.0–8.0)

## 2017-04-16 LAB — I-STAT BETA HCG BLOOD, ED (MC, WL, AP ONLY): I-stat hCG, quantitative: >2000 m[IU]/mL — ABNORMAL HIGH (ref <5)

## 2017-04-16 LAB — CBC
HCT: 39.7 % (ref 36.0–46.0)
Hemoglobin: 13.4 g/dL (ref 12.0–15.0)
MCH: 32.1 pg (ref 26.0–34.0)
MCHC: 33.8 g/dL (ref 30.0–36.0)
MCV: 95 fL (ref 78.0–100.0)
Platelets: 311 10*3/uL (ref 150–400)
RBC: 4.18 MIL/uL (ref 3.87–5.11)
RDW: 12.9 % (ref 11.5–15.5)
WBC: 11.3 10*3/uL — AB (ref 4.0–10.5)

## 2017-04-16 LAB — CBG MONITORING, ED: Glucose-Capillary: 126 mg/dL — ABNORMAL HIGH (ref 65–99)

## 2017-04-16 MED ORDER — PROMETHAZINE HCL 25 MG PO TABS: 25.0000 mg | ORAL_TABLET | Freq: Four times a day (QID) | ORAL | 1 refills | Status: DC | PRN

## 2017-04-16 MED ORDER — ACETAMINOPHEN 650 MG RE SUPP
650.0000 mg | Freq: Once | RECTAL | Status: AC
  Administered 2017-04-16: 650 mg via RECTAL
  Filled 2017-04-16: qty 1

## 2017-04-16 MED ORDER — ONDANSETRON HCL 4 MG/2ML IJ SOLN
INTRAMUSCULAR | Status: AC
  Administered 2017-04-16: 13:00:00
  Filled 2017-04-16: qty 2

## 2017-04-16 MED ORDER — SODIUM CHLORIDE 0.9 % IV BOLUS (SEPSIS)
2000.0000 mL | Freq: Once | INTRAVENOUS | Status: AC
  Administered 2017-04-16: 2000 mL via INTRAVENOUS

## 2017-04-16 MED ORDER — ONDANSETRON 4 MG PO TBDP
4.0000 mg | ORAL_TABLET | Freq: Once | ORAL | Status: AC | PRN
  Administered 2017-04-16: 4 mg via ORAL
  Filled 2017-04-16 (×2): qty 1

## 2017-04-16 NOTE — ED Notes (Signed)
Pt given water and gingerale well.  No vomiting at this time.

## 2017-04-16 NOTE — ED Notes (Signed)
Pt informed about needing a urine sample.  Pt gave no response and covered herself back from up with a blanket

## 2017-04-16 NOTE — ED Notes (Signed)
Dr. Glo Herring at bedside talking with patient.

## 2017-04-16 NOTE — Progress Notes (Signed)
Pt comes out of room , demanding that she wishes to go home. Pt is offered to continue with admission plans, or sign out ama. If she is to sign out ama, I have offered to escribe Phenergan tablets and suppositories to assist with her nausea management. Patient elects to sign out AMA. Phenergan Rx's to be escribed to [facility] as per my discussion with pt and her boyfriend.

## 2017-04-16 NOTE — ED Notes (Signed)
Pt refused in and out cath but pt was able to unsteadily ambulate to bathroom with assistance from RN and this tech.

## 2017-04-16 NOTE — ED Notes (Signed)
Staff went to clean patients room and patient had urinated at the end of the stretcher and urine was in hallway.

## 2017-04-16 NOTE — ED Provider Notes (Signed)
[facility] EMERGENCY DEPARTMENT Provider Note   CSN: [id number] Arrival date & time: 04/16/17  0354     History   Chief Complaint Chief Complaint  Patient presents with  . Emesis During Pregnancy    HPI Janet Dixon is a 32 y.o. female.  Patient complains of persistent vomiting.  Patient has 4-year-old woman and is pregnant again in the first trimester   The history is provided by the patient. No language interpreter was used.  Emesis   This is a new problem. The current episode started 2 days ago. The problem occurs 5 to 10 times per day. There has been no fever. Associated symptoms include abdominal pain. Pertinent negatives include no chills, no cough, no diarrhea and no headaches.    Past Medical History:  Diagnosis Date  . Asthma   . History of chlamydia   . History of gonorrhea   . History of trichomoniasis   . Seizures [facility])     Patient Active Problem List   Diagnosis Date Noted  . Less than [redacted] weeks gestation of pregnancy 04/14/2017  . Pregnancy examination or test, positive result 04/14/2017  . Elevated lipase 04/14/2017  . Stomach pain 04/14/2017  . History of C-section 04/14/2017  . Pregnancy examination or test, negative result 12/07/2016  . Missed period 12/07/2016  . Hematuria 12/07/2016  . History of trichomoniasis 12/07/2016  . History of chlamydia 12/07/2016  . Patient desires pregnancy 12/07/2016  . Chlamydia 09/07/2016  . Papanicolaou smear of cervix with positive high risk human papilloma virus (HPV) test 09/07/2016  . Trichimoniasis 08/29/2016  . Vaginal discharge 08/29/2016  . Encounter to determine fetal viability of pregnancy 08/29/2016  . Status post cesarean section 12/02/2014  . Urinary tract infection with hematuria 11/30/2014  . Seizure disorder ([place]) 05/01/2014  . Cocaine abuse ([place]) 03/14/2014    Past Surgical History:  Procedure Laterality Date  . CESAREAN SECTION    . CESAREAN SECTION N/A 12/02/2014   Procedure: REPEAT  CESAREAN SECTION;  Surgeon: Jonnie Kind, MD;  Location: [place] [facility];  Service: Obstetrics;  Laterality: N/A;    OB History    Gravida Para Term Preterm AB Living   7 6 6    0 6   SAB TAB Ectopic Multiple Live Births   0     0 6       Home Medications    Prior to Admission medications   Medication Sig Start Date End Date Taking? Authorizing Provider  flintstones complete (FLINTSTONES) 60 MG chewable tablet Take 2 daily 04/14/17  Yes Derrek Monaco A, NP  albuterol (PROVENTIL HFA;VENTOLIN HFA) 108 (90 Base) MCG/ACT inhaler Inhale 2 puffs into the lungs every 6 (six) hours as needed for wheezing or shortness of breath. Patient not taking: Reported on 04/16/2017 08/29/16   Estill Dooms, NP    Family History Family History  Problem Relation Age of Onset  . Hypertension Mother   . Asthma Brother   . Asthma Daughter   . Eczema Daughter   . Asthma Son   . Eczema Son   . Aneurysm Maternal Grandmother   . Heart disease Maternal Grandmother   . Cancer Maternal Grandmother   . Asthma Daughter   . Eczema Daughter   . Asthma Son   . Eczema Son   . Eczema Son   . Cancer Maternal Grandfather        colon  . Eczema Maternal Aunt     Social History Social History   Tobacco  Use  . Smoking status: Current Every Day Smoker    Years: 13.00    Types: Cigarettes  . Smokeless tobacco: Never Used  . Tobacco comment: 3 per day  Substance Use Topics  . Alcohol use: No    Comment: none x 1 month ago  . Drug use: Yes    Types: Marijuana, Cocaine    Comment: last used 04/07/17     Allergies   Macrobid [nitrofurantoin macrocrystal]; Diphenhydramine hcl; Ibuprofen; and Flexeril [cyclobenzaprine hcl]   Review of Systems Review of Systems  Constitutional: Negative for appetite change, chills and fatigue.  HENT: Negative for congestion, ear discharge and sinus pressure.   Eyes: Negative for discharge.  Respiratory: Negative for cough.   Cardiovascular: Negative for chest pain.    Gastrointestinal: Positive for abdominal pain and vomiting. Negative for diarrhea.  Genitourinary: Negative for frequency and hematuria.  Musculoskeletal: Negative for back pain.  Skin: Negative for rash.  Neurological: Negative for seizures and headaches.  Psychiatric/Behavioral: Negative for hallucinations.     Physical Exam Updated Vital Signs BP 120/71 (BP Location: Left Arm) Comment: Simultaneous filing. User may not have seen previous data.  Pulse 63 Comment: Simultaneous filing. User may not have seen previous data.  Temp 97.6 F (36.4 C) (Temporal)   Resp 18   Ht 5' 1"  (1.549 m)   Wt 54 kg (119 lb)   LMP 03/11/2017   SpO2 100% Comment: Simultaneous filing. User may not have seen previous data.  BMI 22.48 kg/m   Physical Exam  Constitutional: She is oriented to person, place, and time. She appears well-developed.  HENT:  Head: Normocephalic.  Eyes: Conjunctivae and EOM are normal. No scleral icterus.  Neck: Neck supple. No thyromegaly present.  Cardiovascular: Normal rate and regular rhythm. Exam reveals no gallop and no friction rub.  No murmur heard. Pulmonary/Chest: No stridor. She has no wheezes. She has no rales. She exhibits no tenderness.  Abdominal: She exhibits no distension. There is tenderness. There is no rebound.  Musculoskeletal: Normal range of motion. She exhibits no edema.  Lymphadenopathy:    She has no cervical adenopathy.  Neurological: She is oriented to person, place, and time. She exhibits normal muscle tone. Coordination normal.  Skin: No rash noted. No erythema.  Psychiatric: She has a normal mood and affect. Her behavior is normal.     ED Treatments / Results  Labs (all labs ordered are listed, but only abnormal results are displayed) Labs Reviewed  LIPASE, BLOOD - Abnormal; Notable for the following components:      Result Value   Lipase 186 (*)    All other components within normal limits  COMPREHENSIVE METABOLIC PANEL - Abnormal;  Notable for the following components:   Glucose, Bld 117 (*)    Total Protein 8.4 (*)    All other components within normal limits  CBC - Abnormal; Notable for the following components:   WBC 11.3 (*)    All other components within normal limits  URINALYSIS, ROUTINE W REFLEX MICROSCOPIC - Abnormal; Notable for the following components:   APPearance HAZY (*)    Ketones, ur 20 (*)    All other components within normal limits  RAPID URINE DRUG SCREEN, HOSP PERFORMED - Abnormal; Notable for the following components:   Cocaine POSITIVE (*)    Tetrahydrocannabinol POSITIVE (*)    All other components within normal limits  HCG, QUANTITATIVE, PREGNANCY - Abnormal; Notable for the following components:   hCG, Beta Chain, Quant, S 2,636 (*)  All other components within normal limits  I-STAT BETA HCG BLOOD, ED (MC, [facility], AP ONLY) - Abnormal; Notable for the following components:   I-stat hCG, quantitative >2,000.0 (*)    All other components within normal limits  CBG MONITORING, ED - Abnormal; Notable for the following components:   Glucose-Capillary 126 (*)    All other components within normal limits  ETHANOL    EKG  EKG Interpretation None       Radiology US Abdomen Complete  Result Date: 04/16/2017 CLINICAL DATA:  Increased lipase. EXAM: ABDOMEN ULTRASOUND COMPLETE COMPARISON:  None. FINDINGS: Gallbladder: No gallstones or wall thickening visualized. No sonographic Murphy sign noted by sonographer. Common bile duct: Diameter: 2.3 mm Liver: No focal lesion identified. Within normal limits in parenchymal echogenicity. Portal vein is patent on color Doppler imaging with normal direction of blood flow towards the liver. IVC: No abnormality visualized. Pancreas: Visualized portion unremarkable. Spleen: Size and appearance within normal limits. Right Kidney: Length: 10.4 cm. 5.5 mm nonobstructing right renal calculus. Echogenicity within normal limits. No mass or hydronephrosis visualized.  Left Kidney: Length: 9.7 cm. Echogenicity within normal limits. No mass or hydronephrosis visualized. Abdominal aorta: No aneurysm visualized. Other findings: None. IMPRESSION: 1. No cholelithiasis or sonographic evidence of acute cholecystitis. 2. 5.5 mm nonobstructing right renal calculus. Electronically Signed   By: Kathreen Devoid   On: 04/16/2017 14:01   US Ob Comp Less 14 Wks  Result Date: 04/16/2017 CLINICAL DATA:  Positive pregnancy test, beta HCG 2636 EXAM: OBSTETRIC <14 WK ULTRASOUND TECHNIQUE: Transabdominal ultrasound was performed for evaluation of the gestation as well as the maternal uterus and adnexal regions. COMPARISON:  None. FINDINGS: Intrauterine gestational sac: None Yolk sac:  Not Visualized. Embryo:  Not Visualized. Subchorionic hemorrhage:  None visualized. Maternal uterus/adnexae: Endometrial complex measures 6 mm. Right ovary measures 3.5 x 2.3 x 2.5 cm. Left ovary is not discretely visualized. No abnormal pelvic fluid. IMPRESSION: No IUP is visualized. By definition, in the setting of a positive pregnancy test, this reflects a pregnancy of unknown location. Differential considerations include early normal IUP, abnormal IUP/missed abortion, or nonvisualized ectopic pregnancy. Given the beta HCG, early normal IUP is considered unlikely. Serial beta HCG is suggested. Consider repeat pelvic ultrasound in 14 days (or earlier as clinically warranted). Electronically Signed   By: Julian Hy M.D.   On: 04/16/2017 14:05    Procedures Procedures (including critical care time)  Medications Ordered in ED Medications  ondansetron (ZOFRAN-ODT) disintegrating tablet 4 mg (4 mg Oral Given 04/16/17 1012)  sodium chloride 0.9 % bolus 2,000 mL (0 mLs Intravenous Stopped 04/16/17 1453)  acetaminophen (TYLENOL) suppository 650 mg (650 mg Rectal Given 04/16/17 1146)  ondansetron (ZOFRAN) 4 MG/2ML injection (  Given 04/16/17 1255)     Initial Impression / Assessment and Plan / ED Course  I have  reviewed the triage vital signs and the nursing notes.  Pertinent labs & imaging results that were available during my care of the patient were reviewed by me and considered in my medical decision making (see chart for details). Patient with persistent vomiting and first trimester pregnancy.  Patient will be admitted for persistent vomiting with pregnancy      Final Clinical Impressions(s) / ED Diagnoses   Final diagnoses:  Hyperemesis gravidarum    ED Discharge Orders    None       Milton Ferguson, MD 04/16/17 1505

## 2017-04-16 NOTE — ED Notes (Signed)
Patient requesting something for pain due to vomiting. Per Dr. Roderic Palau patient may have Tylenol Suppository.

## 2017-04-16 NOTE — ED Notes (Signed)
Patient has drank 6 cups of water with no vomiting noted.

## 2017-04-16 NOTE — ED Notes (Addendum)
Patient was ambulatory to nurses station and stated she was going to leave and did not want to be admitted. Dr. Glo Herring talking to patient regarding risk of leaving and informed writer to remove patients IV and patient was to sign AMA papers.

## 2017-04-16 NOTE — H&P (Signed)
Janet Dixon is a 32 y.o. female [phone] now early pregnant, who is presenting for alleged abdominal pain, nausea and vomiting with elevated Lipase, found again to have a positive UDS cocaine and THC.  Her behavior is dramatic, agitated, unable to be still, alternately undressing completely, and putting clothing back on, rambling in hallway. She refused to communicate with the nursing staff in response to questions, but is willing to reply to me .  She is oriented x 3,  She is vomiting recurrently, mostly the water she is drinking, and she denies use of cocaine, stating that she used to use  Cocaine , but denies it's use in a year. Review of Med record shows recurrent ED visit uds's have been postive for cocaine, and THC, with benzodiazepines noted last summer as well.  She states that she will not accept transfer to behavioral health, and seems willing to cooperate to some degree with care here.  Her boyfriend who reports that they live together has arrived and states that he will stay with her tonight , to attempt to assist [name] in providing care. Tomorrow her mother would allegedly be available to assist with care as he "has a funeral"  Evaluation includes a  + hcg 2636, and pt would not cooperate with pelvic u/s sufficiently to complete the pelvic u/s transvaginal component. OB History    Gravida Para Term Preterm AB Living   7 6 6    0 6   SAB TAB Ectopic Multiple Live Births   0     0 6     Past Medical History:  Diagnosis Date  . Asthma   . History of chlamydia   . History of gonorrhea   . History of trichomoniasis   . Seizures ([place])    Past Surgical History:  Procedure Laterality Date  . CESAREAN SECTION    . CESAREAN SECTION N/A 12/02/2014   Procedure: REPEAT CESAREAN SECTION;  Surgeon: Jonnie Kind, MD;  Location: [place] [facility];  Service: Obstetrics;  Laterality: N/A;   Family History: family history includes Aneurysm in her maternal grandmother; Asthma in her brother, daughter,  daughter, son, and son; Cancer in her maternal grandfather and maternal grandmother; Eczema in her daughter, daughter, maternal aunt, son, son, and son; Heart disease in her maternal grandmother; Hypertension in her mother. Social History:  reports that she has been smoking cigarettes.  She has smoked for the past 13.00 years. she has never used smokeless tobacco. She reports that she uses drugs. Drugs: Marijuana and Cocaine. She reports that she does not drink alcohol.     [facility]  Denies recent cocaine use,   History   Blood pressure 120/71, pulse 63, temperature 97.6 F (36.4 C), temperature source Temporal, resp. rate 18, height 5' 1"  (1.549 m), weight 119 lb (54 kg), last menstrual period 03/11/2017, SpO2 100 %. Exam Physical Exam  Constitutional: She appears well-developed and well-nourished. She appears distressed.  HENT:  Head: Normocephalic.  Eyes: Pupils are equal, round, and reactive to light.  Neck: Normal range of motion.  Respiratory: Effort normal.  GI: Soft.  Exam limited by pt agitation, and   Neurological: She is alert.  Agitated, rocking in bed, standing in bed, curling in fetal position in bed, gets out of bed but willing to return when I insisted. Has been in bathrobe only , then has dressed, then completely undressed in bed in the last 20 minutes.  She is not at present angry, or hostile, but has difficulty interacting  with staff. Prior to my arrival she was reportedly unwilling to communicate with staff. She alleges that no one would assist with her care, wouldn't be brought water as requested, but has 5 cups of ice and water in room at present.     Prenatal labs: ABO, Rh:   Antibody:   Rubella:   RPR:    HBsAg:    HIV: NON REACTIVE, Non Reactive (07/17 1002)  GBS:    CBC    Component Value Date/Time   WBC 11.3 (H) 04/16/2017 1025   RBC 4.18 04/16/2017 1025   HGB 13.4 04/16/2017 1025   HGB 14.4 08/14/2012 1242   HCT 39.7 04/16/2017 1025   HCT 42.7  08/14/2012 1242   PLT 311 04/16/2017 1025   PLT 323 08/14/2012 1242   MCV 95.0 04/16/2017 1025   MCV 98 08/14/2012 1242   MCH 32.1 04/16/2017 1025   MCHC 33.8 04/16/2017 1025   RDW 12.9 04/16/2017 1025   RDW 14.5 08/14/2012 1242   LYMPHSABS 3.0 02/25/2017 1540   MONOABS 0.6 02/25/2017 1540   EOSABS 0.1 02/25/2017 1540   BASOSABS 0.0 02/25/2017 1540   CMP Latest Ref Rng & Units 04/16/2017 02/25/2017 01/13/2017  Glucose 65 - 99 mg/dL 117(H) 95 123(H)  BUN 6 - 20 mg/dL 9 8 12   Creatinine 0.44 - 1.00 mg/dL 0.67 0.63 0.65  Sodium 135 - 145 mmol/L 142 138 142  Potassium 3.5 - 5.1 mmol/L 3.9 4.5 3.9  Chloride 101 - 111 mmol/L 110 107 108  CO2 22 - 32 mmol/L 23 24 23   Calcium 8.9 - 10.3 mg/dL 9.8 10.1 9.9  Total Protein 6.5 - 8.1 g/dL 8.4(H) 7.6 8.1  Total Bilirubin 0.3 - 1.2 mg/dL 0.3 0.3 0.5  Alkaline Phos 38 - 126 U/L 72 69 72  AST 15 - 41 U/L 27 30 28   ALT 14 - 54 U/L 17 20 23    Lipase     Component Value Date/Time   LIPASE 186 (H) 04/16/2017 1025   CLINICAL DATA:  Positive pregnancy test, beta HCG 2636  EXAM: OBSTETRIC <14 WK ULTRASOUND  TECHNIQUE: Transabdominal ultrasound was performed for evaluation of the gestation as well as the maternal uterus and adnexal regions.  COMPARISON:  None.  FINDINGS: Intrauterine gestational sac: None  Yolk sac:  Not Visualized.  Embryo:  Not Visualized.  Subchorionic hemorrhage:  None visualized.  Maternal uterus/adnexae: Endometrial complex measures 6 mm.  Right ovary measures 3.5 x 2.3 x 2.5 cm.  Left ovary is not discretely visualized.  No abnormal pelvic fluid.  IMPRESSION: No IUP is visualized.  By definition, in the setting of a positive pregnancy test, this reflects a pregnancy of unknown location. Differential considerations include early normal IUP, abnormal IUP/missed abortion, or nonvisualized ectopic pregnancy.  Given the beta HCG, early normal IUP is considered unlikely.  Serial beta HCG  is suggested. Consider repeat pelvic ultrasound in 14 days (or earlier as clinically warranted). Unable to complete TV u/s due to pt agitation and hyperactivity.  Electronically Signed   By: Julian Hy M.D.   On: 04/16/2017 14:05  Assessment/Plan: 1. Early pregnancy , unknown location 2. Nausea and vomiting of early pregnancy 3. Elevated LIPASE AT 186.  Plan  Admit, start iv, add iv phergan to zofran, Will ask boyfriend to stay with [name] tonight to assist with her care.  If pt agitation worsens, may need behavioral health involvement or IVC.   Jonnie Kind 04/16/2017, 5:13 PM

## 2017-04-16 NOTE — ED Notes (Signed)
Patient walking into hallway with robe open and will not wear gown. Patient has laid on stretcher in hallway numerous times. Patient instructed to stay in room as patients needs a request have been met. Patient walked around nurses station to find hallway bed in after staff has told patient to stay in room. Patient taken back to room. Security at bedside.

## 2017-04-16 NOTE — ED Triage Notes (Signed)
Patient c/o nausea, vomiting, and diarrhea. Per patient generalized abd pain. Patient is [redacted] weeks pregnant in which she goes to [facility]. Patient states 7th pregnancy with no previous complications. Patient actively vomiting in triage.

## 2017-04-16 NOTE — ED Notes (Signed)
Pt refused in and out cath.  Only shook her head and would not speak.  Pt grabbed NT arms in an aggressive way when the NT was only trying to stabilize the pt when walking.  Pt began to throw the blankets from the bed everywhere in the bed, kept tossing and turning in the bed, kicked pillow off the bed and refused to lay on her back.

## 2017-04-17 ENCOUNTER — Telehealth: Payer: Self-pay | Admitting: Obstetrics and Gynecology

## 2017-04-17 NOTE — Telephone Encounter (Signed)
Call to Janet Dixon's home number; she's sleeping, keeping liquids down. And Has NOT gotten Rx for phenergan filled yet.  Pt has appt at [facility] next week.

## 2017-04-18 ENCOUNTER — Telehealth: Payer: Self-pay | Admitting: *Deleted

## 2017-04-18 ENCOUNTER — Encounter (HOSPITAL_COMMUNITY): Payer: Self-pay | Admitting: *Deleted

## 2017-04-18 ENCOUNTER — Emergency Department (HOSPITAL_COMMUNITY)
Admission: EM | Admit: 2017-04-18 | Discharge: 2017-04-18 | Disposition: A | Discharge status: Home / Self Care | Payer: Medicaid Other | Attending: Emergency Medicine | Admitting: Emergency Medicine

## 2017-04-18 DIAGNOSIS — Z5321 Procedure and treatment not carried out due to patient leaving prior to being seen by health care provider: Secondary | ICD-10-CM | POA: Insufficient documentation

## 2017-04-18 DIAGNOSIS — R109 Unspecified abdominal pain: Secondary | ICD-10-CM | POA: Insufficient documentation

## 2017-04-18 NOTE — ED Triage Notes (Signed)
Pt states that she was here for same 3 days ago with dx of pancreatitis.  Pt states she continues to have N/V.  Pt states she is [redacted] weeks pregnant.

## 2017-04-18 NOTE — Telephone Encounter (Signed)
Patient called stating she is still unable to keep fluids down despite taking 2 doses of phenergan.  She says the phenergan is making it worse and doesn't know what to do.  Informed patient that Dr Glo Herring called her yesterday and spoke to someone who stated she was sleeping and was keeping fluids down but had not taken the phenergan.  Also informed that per his note from when she was seen on 1/6 in the ER, he advised she be admitted for fluids and IV phenergan but she refused and left AMA.  Advised patient that if she is still unable to keep fluids down, she needed to go back to the ER for fluids but needed to be cooperative and listen to their suggestions.  Pt began to cry on the phone and stated "what if I drink water but then vomit it back up and what about the IV fluids, won't I throw that up?" Informed she would probably receive IV phenergan but if she was drinking water and not keeping it down, it would not do her any good.  Verbalized understanding and stated she would go.

## 2017-04-18 NOTE — ED Notes (Signed)
Registration reported that pt left at 1201.

## 2017-04-19 ENCOUNTER — Telehealth: Payer: Self-pay | Admitting: *Deleted

## 2017-04-19 NOTE — Telephone Encounter (Signed)
Patient called stating she was calling to get results for labs drawn today which were ordered by Anderson Malta last week.  As I was trying to inform patient that it was not resulted yet, patient was going off stating "I am tired of being treated this way, y'all have delivered 6 of my babies and I don't feel like I should be treated this way. For 2 days, people have been talking down to me and I'm not liking it."  Patient then hung up.  Knute Neu, CNM present for entire conversation with patient on speaker phone.

## 2017-04-20 LAB — LIPASE: Lipase: 17 U/L (ref 14–72)

## 2017-04-21 ENCOUNTER — Encounter (HOSPITAL_COMMUNITY): Payer: Self-pay | Admitting: Emergency Medicine

## 2017-04-21 ENCOUNTER — Emergency Department (HOSPITAL_COMMUNITY)
Admission: EM | Admit: 2017-04-21 | Discharge: 2017-04-21 | Disposition: A | Discharge status: Home / Self Care | Payer: Medicaid Other | Attending: Emergency Medicine | Admitting: Emergency Medicine

## 2017-04-21 ENCOUNTER — Telehealth: Payer: Self-pay | Admitting: *Deleted

## 2017-04-21 ENCOUNTER — Emergency Department (HOSPITAL_COMMUNITY): Payer: Medicaid Other

## 2017-04-21 ENCOUNTER — Other Ambulatory Visit: Payer: Self-pay

## 2017-04-21 DIAGNOSIS — O99511 Diseases of the respiratory system complicating pregnancy, first trimester: Secondary | ICD-10-CM | POA: Insufficient documentation

## 2017-04-21 DIAGNOSIS — Z79899 Other long term (current) drug therapy: Secondary | ICD-10-CM | POA: Insufficient documentation

## 2017-04-21 DIAGNOSIS — O9989 Other specified diseases and conditions complicating pregnancy, childbirth and the puerperium: Secondary | ICD-10-CM | POA: Insufficient documentation

## 2017-04-21 DIAGNOSIS — Z3A01 Less than 8 weeks gestation of pregnancy: Secondary | ICD-10-CM | POA: Insufficient documentation

## 2017-04-21 DIAGNOSIS — O26899 Other specified pregnancy related conditions, unspecified trimester: Secondary | ICD-10-CM

## 2017-04-21 DIAGNOSIS — J45909 Unspecified asthma, uncomplicated: Secondary | ICD-10-CM | POA: Insufficient documentation

## 2017-04-21 DIAGNOSIS — O99331 Smoking (tobacco) complicating pregnancy, first trimester: Secondary | ICD-10-CM | POA: Diagnosis not present

## 2017-04-21 DIAGNOSIS — R112 Nausea with vomiting, unspecified: Secondary | ICD-10-CM

## 2017-04-21 DIAGNOSIS — R109 Unspecified abdominal pain: Secondary | ICD-10-CM | POA: Diagnosis present

## 2017-04-21 DIAGNOSIS — R102 Pelvic and perineal pain: Secondary | ICD-10-CM

## 2017-04-21 DIAGNOSIS — O219 Vomiting of pregnancy, unspecified: Secondary | ICD-10-CM | POA: Insufficient documentation

## 2017-04-21 DIAGNOSIS — F17210 Nicotine dependence, cigarettes, uncomplicated: Secondary | ICD-10-CM | POA: Diagnosis not present

## 2017-04-21 LAB — URINALYSIS, ROUTINE W REFLEX MICROSCOPIC
Bilirubin Urine: NEGATIVE
GLUCOSE, UA: NEGATIVE mg/dL
Hgb urine dipstick: NEGATIVE
Ketones, ur: 5 mg/dL — AB
NITRITE: NEGATIVE
PH: 6 (ref 5.0–8.0)
PROTEIN: 100 mg/dL — AB
SPECIFIC GRAVITY, URINE: 1.036 — AB (ref 1.005–1.030)

## 2017-04-21 LAB — LIPASE, BLOOD: Lipase: 23 U/L (ref 11–51)

## 2017-04-21 LAB — RAPID URINE DRUG SCREEN, HOSP PERFORMED
AMPHETAMINES: NOT DETECTED
BARBITURATES: NOT DETECTED
Benzodiazepines: NOT DETECTED
Cocaine: POSITIVE — AB
Opiates: NOT DETECTED
TETRAHYDROCANNABINOL: POSITIVE — AB

## 2017-04-21 LAB — COMPREHENSIVE METABOLIC PANEL
ALBUMIN: 4.7 g/dL (ref 3.5–5.0)
ALT: 20 U/L (ref 14–54)
ANION GAP: 12 (ref 5–15)
AST: 18 U/L (ref 15–41)
Alkaline Phosphatase: 76 U/L (ref 38–126)
BUN: 16 mg/dL (ref 6–20)
CO2: 24 mmol/L (ref 22–32)
Calcium: 9.5 mg/dL (ref 8.9–10.3)
Chloride: 98 mmol/L — ABNORMAL LOW (ref 101–111)
Creatinine, Ser: 0.78 mg/dL (ref 0.44–1.00)
GFR calc non Af Amer: >60 mL/min (ref >60)
GLUCOSE: 112 mg/dL — AB (ref 65–99)
POTASSIUM: 2.8 mmol/L — AB (ref 3.5–5.1)
SODIUM: 134 mmol/L — AB (ref 135–145)
Total Bilirubin: 0.6 mg/dL (ref 0.3–1.2)
Total Protein: 8.4 g/dL — ABNORMAL HIGH (ref 6.5–8.1)

## 2017-04-21 LAB — CBC WITH DIFFERENTIAL/PLATELET
BASOS PCT: 0 %
Basophils Absolute: 0 10*3/uL (ref 0.0–0.1)
EOS ABS: 0.1 10*3/uL (ref 0.0–0.7)
Eosinophils Relative: 1 %
HCT: 44 % (ref 36.0–46.0)
Hemoglobin: 15.5 g/dL — ABNORMAL HIGH (ref 12.0–15.0)
LYMPHS ABS: 6.2 10*3/uL — AB (ref 0.7–4.0)
Lymphocytes Relative: 45 %
MCH: 32.5 pg (ref 26.0–34.0)
MCHC: 35.2 g/dL (ref 30.0–36.0)
MCV: 92.2 fL (ref 78.0–100.0)
MONO ABS: 1.2 10*3/uL — AB (ref 0.1–1.0)
MONOS PCT: 9 %
Neutro Abs: 6.1 10*3/uL (ref 1.7–7.7)
Neutrophils Relative %: 45 %
Platelets: 420 10*3/uL — ABNORMAL HIGH (ref 150–400)
RBC: 4.77 MIL/uL (ref 3.87–5.11)
RDW: 12.3 % (ref 11.5–15.5)
WBC: 13.5 10*3/uL — ABNORMAL HIGH (ref 4.0–10.5)

## 2017-04-21 LAB — ETHANOL: Alcohol, Ethyl (B): <10 mg/dL (ref <10)

## 2017-04-21 LAB — WET PREP, GENITAL
CLUE CELLS WET PREP: NONE SEEN
Sperm: NONE SEEN
Trich, Wet Prep: NONE SEEN
Yeast Wet Prep HPF POC: NONE SEEN

## 2017-04-21 LAB — ABO/RH: ABO/RH(D): O POS

## 2017-04-21 LAB — HCG, QUANTITATIVE, PREGNANCY: HCG, BETA CHAIN, QUANT, S: 11625 m[IU]/mL — AB (ref <5)

## 2017-04-21 MED ORDER — MORPHINE SULFATE (PF) 4 MG/ML IV SOLN
4.0000 mg | Freq: Once | INTRAVENOUS | Status: AC
  Administered 2017-04-21: 4 mg via INTRAVENOUS
  Filled 2017-04-21: qty 1

## 2017-04-21 MED ORDER — CEPHALEXIN 500 MG PO CAPS: 500.0000 mg | ORAL_CAPSULE | Freq: Four times a day (QID) | ORAL | 0 refills | Status: DC

## 2017-04-21 MED ORDER — PROMETHAZINE HCL 25 MG/ML IJ SOLN
25.0000 mg | Freq: Once | INTRAMUSCULAR | Status: AC
  Administered 2017-04-21: 25 mg via INTRAVENOUS
  Filled 2017-04-21: qty 1

## 2017-04-21 MED ORDER — POTASSIUM CHLORIDE 10 MEQ/100ML IV SOLN
10.0000 meq | INTRAVENOUS | Status: AC
Start: 2017-04-21 — End: 2017-04-21
  Administered 2017-04-21 (×2): 10 meq via INTRAVENOUS
  Filled 2017-04-21 (×2): qty 100

## 2017-04-21 MED ORDER — PROMETHAZINE HCL 25 MG RE SUPP: 25.0000 mg | Freq: Four times a day (QID) | RECTAL | 0 refills | Status: DC | PRN

## 2017-04-21 MED ORDER — SODIUM CHLORIDE 0.9 % IV BOLUS (SEPSIS)
1000.0000 mL | Freq: Once | INTRAVENOUS | Status: AC
Start: 2017-04-21 — End: 2017-04-21
  Administered 2017-04-21: 1000 mL via INTRAVENOUS

## 2017-04-21 MED ORDER — POTASSIUM CHLORIDE CRYS ER 20 MEQ PO TBCR
40.0000 meq | EXTENDED_RELEASE_TABLET | Freq: Once | ORAL | Status: AC
  Administered 2017-04-21: 40 meq via ORAL
  Filled 2017-04-21: qty 2

## 2017-04-21 MED ORDER — SODIUM CHLORIDE 0.9 % IV BOLUS (SEPSIS)
1000.0000 mL | Freq: Once | INTRAVENOUS | Status: AC
  Administered 2017-04-21: 1000 mL via INTRAVENOUS

## 2017-04-21 MED ORDER — POTASSIUM CHLORIDE ER 10 MEQ PO TBCR: 10.0000 meq | EXTENDED_RELEASE_TABLET | Freq: Two times a day (BID) | ORAL | 0 refills | Status: DC

## 2017-04-21 NOTE — ED Provider Notes (Signed)
[facility] EMERGENCY DEPARTMENT Provider Note   CSN: [id number] Arrival date & time: 04/21/17  0005     History   Chief Complaint Chief Complaint  Patient presents with  . Abdominal Pain    HPI Janet Dixon is a 32 y.o. female.  Patient states she is [redacted] weeks pregnant G7 P6 presenting with severe pelvic pain onset about 1 hour ago.  Describes pain across her lower abdomen that is constant and worsened when she took Tylenol at home.  She denies any vaginal bleeding or discharge.  She had 2 episodes of vomiting today which she has had intermittently over the past week that has improved with phenergan.  Notably she was seen on January 6 with upper abdominal pain in setting of early pregnancy and admission was planned for pancreatitis but patient left AMA.  Her lipase has since trended down.  Her ultrasound on January 6 did not show an IUP.  Patient states she is never had this kind of pain before and this is different than her pancreatitis pain.  Denies any pain with urination or blood in the urine.   The history is provided by the patient.  Abdominal Pain   Associated symptoms include diarrhea, nausea and vomiting. Pertinent negatives include fever, dysuria, frequency, hematuria, headaches, arthralgias and myalgias.    Past Medical History:  Diagnosis Date  . Asthma   . History of chlamydia   . History of gonorrhea   . History of trichomoniasis   . Seizures [facility])     Patient Active Problem List   Diagnosis Date Noted  . Nausea and vomiting during pregnancy prior to [redacted] weeks gestation 04/16/2017  . Less than [redacted] weeks gestation of pregnancy 04/14/2017  . Pregnancy examination or test, positive result 04/14/2017  . Elevated lipase 04/14/2017  . Stomach pain 04/14/2017  . History of C-section 04/14/2017  . Pregnancy examination or test, negative result 12/07/2016  . Missed period 12/07/2016  . Hematuria 12/07/2016  . History of trichomoniasis 12/07/2016  . History of  chlamydia 12/07/2016  . Patient desires pregnancy 12/07/2016  . Chlamydia 09/07/2016  . Papanicolaou smear of cervix with positive high risk human papilloma virus (HPV) test 09/07/2016  . Trichimoniasis 08/29/2016  . Vaginal discharge 08/29/2016  . Encounter to determine fetal viability of pregnancy 08/29/2016  . Status post cesarean section 12/02/2014  . Urinary tract infection with hematuria 11/30/2014  . Seizure disorder ([place]) 05/01/2014  . Cocaine abuse ([place]) 03/14/2014    Past Surgical History:  Procedure Laterality Date  . CESAREAN SECTION    . CESAREAN SECTION N/A 12/02/2014   Procedure: REPEAT CESAREAN SECTION;  Surgeon: Jonnie Kind, MD;  Location: [facility];  Service: Obstetrics;  Laterality: N/A;    OB History    Gravida Para Term Preterm AB Living   7 6 6    0 6   SAB TAB Ectopic Multiple Live Births   0     0 6       Home Medications    Prior to Admission medications   Medication Sig Start Date End Date Taking? Authorizing Provider  albuterol (PROVENTIL HFA;VENTOLIN HFA) 108 (90 Base) MCG/ACT inhaler Inhale 2 puffs into the lungs every 6 (six) hours as needed for wheezing or shortness of breath. Patient not taking: Reported on 04/16/2017 08/29/16   Estill Dooms, NP  flintstones complete (FLINTSTONES) 60 MG chewable tablet Take 2 daily 04/14/17   Estill Dooms, NP  promethazine (PHENERGAN) 25 MG tablet Take 1  tablet (25 mg total) by mouth every 6 (six) hours as needed for nausea or vomiting (nausea). 04/16/17   Jonnie Kind, MD    Family History Family History  Problem Relation Age of Onset  . Hypertension Mother   . Asthma Brother   . Asthma Daughter   . Eczema Daughter   . Asthma Son   . Eczema Son   . Aneurysm Maternal Grandmother   . Heart disease Maternal Grandmother   . Cancer Maternal Grandmother   . Asthma Daughter   . Eczema Daughter   . Asthma Son   . Eczema Son   . Eczema Son   . Cancer Maternal Grandfather        colon  .  Eczema Maternal Aunt     Social History Social History   Tobacco Use  . Smoking status: Current Every Day Smoker    Years: 13.00    Types: Cigarettes  . Smokeless tobacco: Never Used  . Tobacco comment: 3 per day  Substance Use Topics  . Alcohol use: No    Comment: none x 1 month ago  . Drug use: Yes    Types: Marijuana, Cocaine    Comment: last used 04/07/17     Allergies   Macrobid [nitrofurantoin macrocrystal]; Diphenhydramine hcl; Ibuprofen; and Flexeril [cyclobenzaprine hcl]   Review of Systems Review of Systems  Constitutional: Positive for activity change and appetite change. Negative for fatigue and fever.  Respiratory: Negative for cough, chest tightness and shortness of breath.   Gastrointestinal: Positive for abdominal pain, diarrhea, nausea and vomiting.  Genitourinary: Positive for pelvic pain. Negative for dysuria, flank pain, frequency, hematuria, vaginal bleeding and vaginal discharge.  Musculoskeletal: Positive for back pain. Negative for arthralgias and myalgias.  Neurological: Negative for dizziness, weakness and headaches.    all other systems are negative except as noted in the HPI and PMH.    Physical Exam Updated Vital Signs BP (!) 141/98 (BP Location: Right Arm)   Pulse 90   Temp 98.2 F (36.8 C) (Oral)   Resp 18   Ht 5' 1"  (1.549 m)   Wt 49.9 kg (110 lb)   LMP 03/11/2017   SpO2 100%   BMI 20.78 kg/m   Physical Exam  Constitutional: She is oriented to person, place, and time. She appears well-developed and well-nourished. No distress.  uncomfortable  HENT:  Head: Normocephalic and atraumatic.  Mouth/Throat: Oropharynx is clear and moist. No oropharyngeal exudate.  Eyes: Conjunctivae and EOM are normal. Pupils are equal, round, and reactive to light.  Neck: Normal range of motion. Neck supple.  No meningismus.  Cardiovascular: Normal rate, regular rhythm, normal heart sounds and intact distal pulses.  No murmur  heard. Pulmonary/Chest: Effort normal and breath sounds normal. No respiratory distress. She exhibits no tenderness.  Abdominal: Soft. There is tenderness. There is no rebound and no guarding.  Diffuse tenderness Worse suprapubicly with voluntary guarding No RLQ tenderness  Genitourinary:  Genitourinary Comments: Chaperone present.  White vaginal discharge in vaginal vault.  No CMT.  Midline uterine tenderness.  No lateralizing adnexal tenderness  Musculoskeletal: Normal range of motion. She exhibits no edema or tenderness.  Neurological: She is alert and oriented to person, place, and time. No cranial nerve deficit. She exhibits normal muscle tone. Coordination normal.   5/5 strength throughout. CN 2-12 intact.Equal grip strength.   Skin: Skin is warm.  Psychiatric: She has a normal mood and affect. Her behavior is normal.  Nursing note and vitals reviewed.  ED Treatments / Results  Labs (all labs ordered are listed, but only abnormal results are displayed) Labs Reviewed  WET PREP, GENITAL - Abnormal; Notable for the following components:      Result Value   WBC, Wet Prep HPF POC MODERATE (*)    All other components within normal limits  CBC WITH DIFFERENTIAL/PLATELET - Abnormal; Notable for the following components:   WBC 13.5 (*)    Hemoglobin 15.5 (*)    Platelets 420 (*)    Lymphs Abs 6.2 (*)    Monocytes Absolute 1.2 (*)    All other components within normal limits  COMPREHENSIVE METABOLIC PANEL - Abnormal; Notable for the following components:   Sodium 134 (*)    Potassium 2.8 (*)    Chloride 98 (*)    Glucose, Bld 112 (*)    Total Protein 8.4 (*)    All other components within normal limits  RAPID URINE DRUG SCREEN, HOSP PERFORMED - Abnormal; Notable for the following components:   Cocaine POSITIVE (*)    Tetrahydrocannabinol POSITIVE (*)    All other components within normal limits  HCG, QUANTITATIVE, PREGNANCY - Abnormal; Notable for the following components:    hCG, Beta Chain, Quant, S 11,625 (*)    All other components within normal limits  LIPASE, BLOOD  ETHANOL  URINALYSIS, ROUTINE W REFLEX MICROSCOPIC  ABO/RH  GC/CHLAMYDIA PROBE AMP ([place]) NOT AT [facility]    EKG  EKG Interpretation None       Radiology US Ob Comp < 14 Wks  Result Date: 04/21/2017 CLINICAL DATA:  Pelvic pain x4 days.  Beta HCG 11,625 EXAM: OBSTETRIC <14 WK [name] AND TRANSVAGINAL OB US TECHNIQUE: Both transabdominal and transvaginal ultrasound examinations were performed for complete evaluation of the gestation as well as the maternal uterus, adnexal regions, and pelvic cul-de-sac. Transvaginal technique was performed to assess early pregnancy. COMPARISON:  None. FINDINGS: Intrauterine gestational sac: Single Yolk sac:  Not Visualized. Embryo:  Not Visualized. Cardiac Activity: Not Visualized. Heart Rate: Not applicable MSD: 8.7 mm   5 w   5 d Subchorionic hemorrhage:  Small Maternal uterus/adnexae: Uterus is slightly retroflexed. Small bilateral ovarian follicular sized cysts are noted the largest is 2 cm on the right. IMPRESSION: Elongated slightly irregular gestational sac without yolk sac or fetal pole. Small perigestational hematoma. Findings are suspicious but not yet definitive for failed pregnancy. Recommend follow-up US in 10-14 days for definitive diagnosis. This recommendation follows SRU consensus guidelines: Diagnostic Criteria for Nonviable Pregnancy Early in the First Trimester. [place] Med 2013; 562:1308-65. Electronically Signed   By: Brenlyn Royalty M.D.   On: 04/21/2017 02:36   US Ob Transvaginal  Result Date: 04/21/2017 CLINICAL DATA:  Pelvic pain x4 days.  Beta HCG 11,625 EXAM: OBSTETRIC <14 WK [name] AND TRANSVAGINAL OB US TECHNIQUE: Both transabdominal and transvaginal ultrasound examinations were performed for complete evaluation of the gestation as well as the maternal uterus, adnexal regions, and pelvic cul-de-sac. Transvaginal technique was performed to assess  early pregnancy. COMPARISON:  None. FINDINGS: Intrauterine gestational sac: Single Yolk sac:  Not Visualized. Embryo:  Not Visualized. Cardiac Activity: Not Visualized. Heart Rate: Not applicable MSD: 8.7 mm   5 w   5 d Subchorionic hemorrhage:  Small Maternal uterus/adnexae: Uterus is slightly retroflexed. Small bilateral ovarian follicular sized cysts are noted the largest is 2 cm on the right. IMPRESSION: Elongated slightly irregular gestational sac without yolk sac or fetal pole. Small perigestational hematoma. Findings are suspicious but not  yet definitive for failed pregnancy. Recommend follow-up US in 10-14 days for definitive diagnosis. This recommendation follows SRU consensus guidelines: Diagnostic Criteria for Nonviable Pregnancy Early in the First Trimester. [place] Med 2013; 191:4782-95. Electronically Signed   By: Crystol Royalty M.D.   On: 04/21/2017 02:36    Procedures Procedures (including critical care time)  Medications Ordered in ED Medications  sodium chloride 0.9 % bolus 1,000 mL (1,000 mLs Intravenous New Bag/Given 04/21/17 0027)  morphine 4 MG/ML injection 4 mg (not administered)  promethazine (PHENERGAN) injection 25 mg (25 mg Intravenous Given 04/21/17 0027)   EMERGENCY DEPARTMENT [name] FAST EXAM "Limited Ultrasound of the Abdomen and Pericardium" (FAST Exam).   INDICATIONS:abdominal pain in pregnancy Multiple views of the abdomen and pericardium are obtained with a multi-frequency probe.  PERFORMED BY: Myself IMAGES ARCHIVED?: Yes LIMITATIONS:  Emergent procedure INTERPRETATION:  No abdominal free fluid and No pericardial effusion     Initial Impression / Assessment and Plan / ED Course  I have reviewed the triage vital signs and the nursing notes.  Pertinent labs & imaging results that were available during my care of the patient were reviewed by me and considered in my medical decision making (see chart for details).    Pelvic pain in setting of early pregnancy.   Dissimilar to previous episode of pancreatitis last week. No vaginal bleeding. No free fluid on bedside US. Unable to see IUP transabdominally.  HCG on 1/6 was 2636 with no IUP seen on [name].  Patient did not cooperate for TVUS. UDS positive for cocaine and THC,.  Patient with severe pelvic pain in setting of pregnancy of unknown location.  Will need to repeat ultrasound with transvaginal component to evaluate for ectopic pregnancy. Hypokalemia on labs today, replaced. Lipase has normalized. HCG today 11,625.  US shows intrauterine gestational sac but irregular in appearance suspicious for failed pregnancy.  No ectopic pregnancy noted. D/w patient potential for failed pregnancy and need for repeat US in 10-14 days.  IV fluids and potassium given.  Will treat equivocal urine.  Culture sent. No vomiting in the ED.  Patient to follow-up with OB for repeat ultrasound in 10 days.  Continue antibiotics, potassium supplements, prenatal vitamins. Discussed cessation of marijuana and cocaine.  Return precautions discussed.  BP 107/66 (BP Location: Right Arm)   Pulse 82   Temp 98.4 F (36.9 C) (Oral)   Resp 18   Ht 5' 1"  (1.549 m)   Wt 49.9 kg (110 lb)   LMP 03/11/2017   SpO2 100%   BMI 20.78 kg/m   Final Clinical Impressions(s) / ED Diagnoses   Final diagnoses:  Pelvic pain in pregnancy  Non-intractable vomiting with nausea, unspecified vomiting type    ED Discharge Orders    None       Ezequiel Essex, MD 04/21/17 [phone]

## 2017-04-21 NOTE — ED Notes (Signed)
ED Provider at bedside. 

## 2017-04-21 NOTE — ED Notes (Signed)
Water given at this time

## 2017-04-21 NOTE — Telephone Encounter (Signed)
Patient called stating she was seen in the ED for severe pelvic pain.  States she was told she was having a miscarriage and needed to see [name].  Informed patient the note by the provider states that no fetal cardiac activity, yolk or embryo sac was visualized and was to have repeat u/s in 10-14 days.  Patient then stated "Tish, y'all want me to carry around a dead baby inside of me, do I need to change health care providers?" Informed patient that if she was having a miscarriage, she may could pass it on her own and would have cramping and bleeding, but the repeat u/s in 10-14 days was to recheck for sac and FHT. Advised patient to keep dating U/S as scheduled. Verbalized understanding.

## 2017-04-21 NOTE — Discharge Instructions (Addendum)
Continue your prenatal vitamins. Stop using cocaine and marijuana. Take the potassium pills and antibiotics as prescribed. As we discussed, this pregnancy may not be successful. Follow up with the [facility] doctor for an ultrasound next week. Return to the ED if you develop worsening pain, vaginal bleeding, or any other concerns.

## 2017-04-21 NOTE — ED Triage Notes (Signed)
Pt states she is [redacted] weeks pregnant and has had n/v/d since Monday with severe abd pain starting today, unsure of fever

## 2017-04-22 LAB — GC/CHLAMYDIA PROBE AMP (~~LOC~~) NOT AT ARMC
Chlamydia: POSITIVE — AB
Neisseria Gonorrhea: NEGATIVE

## 2017-04-24 LAB — URINE CULTURE

## 2017-04-25 ENCOUNTER — Telehealth: Payer: Self-pay | Admitting: Adult Health

## 2017-04-25 ENCOUNTER — Telehealth: Payer: Self-pay | Admitting: Emergency Medicine

## 2017-04-25 NOTE — Telephone Encounter (Signed)
Post ED Visit - Positive Culture Follow-up  Culture report reviewed by antimicrobial stewardship pharmacist:  [x]  Elenor Quinones, Pharm.D. []  Heide Guile, Pharm.D., BCPS AQ-ID []  Parks Neptune, Pharm.D., BCPS []  Alycia Rossetti, Pharm.D., BCPS []  [employer], Pharm.D., BCPS, AAHIVP []  Legrand Como, Pharm.D., BCPS, AAHIVP []  Salome Arnt, PharmD, BCPS []  Jalene Mullet, PharmD []  Vincenza Hews, PharmD, BCPS  Positive urine culture Treated with cephalexin, organism sensitive to the same and no further patient follow-up is required at this time.  Hazle Nordmann 04/25/2017, 10:36 AM

## 2017-04-25 NOTE — Telephone Encounter (Signed)
Spoke to patient who states she is unable to sleep despite taking Tylenol PM. Wants to know what else she can do or take.  Informed she could take Unisom which is over the counter. Also encouraged to avoid caffeine at least 3 hours before going to bed. Stated she would try these suggestions.

## 2017-04-27 ENCOUNTER — Ambulatory Visit (INDEPENDENT_AMBULATORY_CARE_PROVIDER_SITE_OTHER): Payer: Medicaid Other

## 2017-04-27 DIAGNOSIS — O3680X0 Pregnancy with inconclusive fetal viability, not applicable or unspecified: Secondary | ICD-10-CM | POA: Diagnosis not present

## 2017-04-27 DIAGNOSIS — Z3A01 Less than 8 weeks gestation of pregnancy: Secondary | ICD-10-CM | POA: Diagnosis not present

## 2017-04-27 NOTE — Progress Notes (Signed)
[name] 6+5 wks,single IUP w/ys,positive fht 123 bpm,normal left ovary,right ovary not visualized,crl 4.3 mm,EDD 12/16/2017 BY LMP

## 2017-04-28 ENCOUNTER — Other Ambulatory Visit: Payer: Medicaid Other

## 2017-05-09 ENCOUNTER — Telehealth: Payer: Self-pay | Admitting: Obstetrics & Gynecology

## 2017-05-09 ENCOUNTER — Telehealth: Payer: Self-pay | Admitting: *Deleted

## 2017-05-09 MED ORDER — PROMETHAZINE HCL 25 MG PO TABS: 25.0000 mg | ORAL_TABLET | Freq: Four times a day (QID) | ORAL | 1 refills | Status: DC | PRN

## 2017-05-09 NOTE — Telephone Encounter (Signed)
LMOVm returning patient's call.

## 2017-05-09 NOTE — Telephone Encounter (Signed)
Patient states she spoke to [place] regarding her Phenergan being sent but states she is "sure something is wrong". She has had abdominal pain since having diarrhea and took one of her mothers pain medications which helped but once it wore off her abdomen starts hurting again. Advised patient to push fluids and take the Imodium for diarrhea. Patient then started talking about boyfriend smoking habits and how she can't stand the smell and what she needs to do. Advised boyfriend should smoke outside and change his clothes to decrease the smell on his clothes. Pt verbalized understanding.

## 2017-05-09 NOTE — Telephone Encounter (Signed)
Patient is requesting something for nausea. Requesting Zofran but informed it is not recommended in the first trimester due to risk of heart defects. Patient states she needs something. Can we send Diclegis?

## 2017-05-09 NOTE — Telephone Encounter (Signed)
Pt aware phenergan sent to drug store, eat carbs, nothing spicy or greasy or fried

## 2017-05-11 ENCOUNTER — Encounter: Payer: Self-pay | Admitting: Advanced Practice Midwife

## 2017-05-11 ENCOUNTER — Ambulatory Visit: Payer: Medicaid Other | Admitting: *Deleted

## 2017-05-11 ENCOUNTER — Ambulatory Visit (INDEPENDENT_AMBULATORY_CARE_PROVIDER_SITE_OTHER): Payer: Medicaid Other | Admitting: Advanced Practice Midwife

## 2017-05-11 VITALS — BP 110/80 | HR 80 | Wt 122.0 lb

## 2017-05-11 DIAGNOSIS — Z331 Pregnant state, incidental: Secondary | ICD-10-CM

## 2017-05-11 DIAGNOSIS — O09291 Supervision of pregnancy with other poor reproductive or obstetric history, first trimester: Secondary | ICD-10-CM | POA: Diagnosis not present

## 2017-05-11 DIAGNOSIS — Z1389 Encounter for screening for other disorder: Secondary | ICD-10-CM

## 2017-05-11 DIAGNOSIS — Z3682 Encounter for antenatal screening for nuchal translucency: Secondary | ICD-10-CM | POA: Diagnosis not present

## 2017-05-11 DIAGNOSIS — R110 Nausea: Secondary | ICD-10-CM | POA: Diagnosis not present

## 2017-05-11 DIAGNOSIS — O2691 Pregnancy related conditions, unspecified, first trimester: Secondary | ICD-10-CM | POA: Diagnosis not present

## 2017-05-11 DIAGNOSIS — Z3481 Encounter for supervision of other normal pregnancy, first trimester: Secondary | ICD-10-CM

## 2017-05-11 DIAGNOSIS — O34219 Maternal care for unspecified type scar from previous cesarean delivery: Secondary | ICD-10-CM

## 2017-05-11 DIAGNOSIS — R102 Pelvic and perineal pain: Secondary | ICD-10-CM

## 2017-05-11 DIAGNOSIS — Z3A08 8 weeks gestation of pregnancy: Secondary | ICD-10-CM

## 2017-05-11 DIAGNOSIS — O0990 Supervision of high risk pregnancy, unspecified, unspecified trimester: Secondary | ICD-10-CM | POA: Insufficient documentation

## 2017-05-11 DIAGNOSIS — N390 Urinary tract infection, site not specified: Secondary | ICD-10-CM

## 2017-05-11 DIAGNOSIS — O2341 Unspecified infection of urinary tract in pregnancy, first trimester: Secondary | ICD-10-CM | POA: Diagnosis not present

## 2017-05-11 LAB — POCT URINALYSIS DIPSTICK
Blood, UA: NEGATIVE
GLUCOSE UA: NEGATIVE
Ketones, UA: NEGATIVE
LEUKOCYTES UA: NEGATIVE
Nitrite, UA: POSITIVE
Protein, UA: NEGATIVE

## 2017-05-11 MED ORDER — DOXYLAMINE-PYRIDOXINE 10-10 MG PO TBEC: DELAYED_RELEASE_TABLET | ORAL | 3 refills | Status: DC

## 2017-05-11 NOTE — Progress Notes (Signed)
Subjective:    Janet Dixon is a [id number] 46w5dbeing seen today for her first obstetrical visit.  Her obstetrical history is significant for 6 c-sections and long-time cocaine abuse.  Pregnancy history fully reviewed. She was seen in ED 1/11, very high on cocaine, dx w/UTI.  Has not taken meds because "they make her sick".  Discussed how cocaine can cause SAB, abruption, plus fetal addiction/problems.  Pt states she's not using anymore.  Discussed how an untreated UTI in pg can lead to RDS.  Agrees to take abx  Also never told her partner she had CHL back in May, knows she must tell him.   Patient reports nausea and suprapubic/pelvic pain.  Vitals:   05/11/17 1353  BP: 110/80  Pulse: 80  Weight: 122 lb (55.3 kg)    HISTORY: OB History  Gravida Para Term Preterm AB Living  7 6 6    0 6  SAB TAB Ectopic Multiple Live Births  0     0 6    # Outcome Date GA Lbr Len/2nd Weight Sex Delivery Anes PTL Lv  7 Current           6 Term 12/02/14 343w0d6 lb 6.1 oz (2.895 kg) F CS-Vac Spinal  LIV  5 Term 04/06/10 3973w0d lb 4 oz (2.381 kg) F CS-LTranv Spinal  LIV  4 Term 02/20/09 37w54w5dlb 3 oz (2.807 kg) M CS-LTranv Spinal  LIV  3 Term 02/22/07 40w06w0db (2.722 kg) M CS-LTranv Spinal  LIV  2 Term 03/22/05 56w2d79w2d 4.9 oz (2.86 kg) M CS-LTranv Spinal N LIV  1 Term 09/08/03 [redacted]w[redacted]d 49w5d8.1 oz (3.405 kg) F CS-LTranv Spinal N LIV     Past Medical History:  Diagnosis Date  . Asthma   . History of chlamydia   . History of gonorrhea   . History of trichomoniasis   . Seizures ([facility])   [place] Surgical History:  Procedure Laterality Date  . CESAREAN SECTION    . CESAREAN SECTION N/A 12/02/2014   Procedure: REPEAT CESAREAN SECTION;  Surgeon: John FeJonnie KindLocation: [facility];[place]Service[place]st[place]s;  Laterality: N/A;   Family History  Problem Relation Age of Onset  . Hypertension Mother   . Asthma Brother   . Asthma Daughter   . Eczema Daughter   . Asthma Son   . Eczema Son   .  Aneurysm Maternal Grandmother   . Heart disease Maternal Grandmother   . Cancer Maternal Grandmother   . Asthma Daughter   . Eczema Daughter   . Asthma Son   . Eczema Son   . Eczema Son   . Cancer Maternal Grandfather        colon  . Eczema Maternal Aunt      Exam                                      System:     Skin: normal coloration and turgor, no rashes    Neurologic: oriented, normal, normal mood   Extremities: normal strength, tone, and muscle mass   HEENT PERRLA   Mouth/Teeth mucous membranes moist, normal dentition   Neck supple and no masses   Cardiovascular: regular rate and rhythm   Respiratory:  appears well, vitals normal, no respiratory distress, acyanotic   Abdomen: soft, non-tender;  FHR:  160 [name]   back NO CVAT.      The nature of [place] with multiple MDs and other Advanced Practice Providers was explained to patient; also emphasized that residents, students are part of our team.  Assessment:    Pregnancy: [id number] Patient Active Problem List   Diagnosis Date Noted  . Supervision of normal pregnancy 05/11/2017  . Nausea and vomiting during pregnancy prior to [redacted] weeks gestation 04/16/2017  . Elevated lipase 04/14/2017  . Stomach pain 04/14/2017  . History of C-section 04/14/2017  . Missed period 12/07/2016  . Hematuria 12/07/2016  . History of trichomoniasis 12/07/2016  . History of chlamydia 12/07/2016  . Chlamydia 09/07/2016  . Papanicolaou smear of cervix with positive high risk human papilloma virus (HPV) test 09/07/2016  . Trichimoniasis 08/29/2016  . Vaginal discharge 08/29/2016  . Status post cesarean section 12/02/2014  . Urinary tract infection with hematuria 11/30/2014  . Seizure disorder ([place]) 05/01/2014  . Cocaine abuse ([place]) 03/14/2014        Plan:     Initial labs drawn. Continue prenatal vitamins  Changed abx to septra for UTI: Pyelo precautions discussed Offered substance  abuse counseling, pt states "that's all behind me" Problem list reviewed and updated  Reviewed n/v relief measures and warning s/s to report Rx Diclegis Reviewed recommended weight gain based on pre-gravid BMI  Encouraged well-balanced diet Genetic Screening discussed Integrated Screen: requested.  Ultrasound discussed; fetal survey: requested.  Return in about 4 weeks (around 06/08/2017) for LROB, US:NT+1st IT.  CRESENZO-DISHMAN,Abass Misener 05/11/2017

## 2017-05-11 NOTE — Patient Instructions (Signed)
First Trimester of Pregnancy The first trimester of pregnancy is from week 1 until the end of week 12 (months 1 through 3). A week after a sperm fertilizes an egg, the egg will implant on the wall of the uterus. This embryo will begin to develop into a baby. Genes from you and your partner are forming the baby. The female genes determine whether the baby is a boy or a girl. At 6-8 weeks, the eyes and face are formed, and the heartbeat can be seen on ultrasound. At the end of 12 weeks, all the baby's organs are formed.  Now that you are pregnant, you will want to do everything you can to have a healthy baby. Two of the most important things are to get good prenatal care and to follow your health care provider's instructions. Prenatal care is all the medical care you receive before the baby's birth. This care will help prevent, find, and treat any problems during the pregnancy and childbirth. BODY CHANGES Your body goes through many changes during pregnancy. The changes vary from woman to woman.   You may gain or lose a couple of pounds at first.  You may feel sick to your stomach (nauseous) and throw up (vomit). If the vomiting is uncontrollable, call your health care provider.  You may tire easily.  You may develop headaches that can be relieved by medicines approved by your health care provider.  You may urinate more often. Painful urination may mean you have a bladder infection.  You may develop heartburn as a result of your pregnancy.  You may develop constipation because certain hormones are causing the muscles that push waste through your intestines to slow down.  You may develop hemorrhoids or swollen, bulging veins (varicose veins).  Your breasts may begin to grow larger and become tender. Your nipples may stick out more, and the tissue that surrounds them (areola) may become darker.  Your gums may bleed and may be sensitive to brushing and flossing.  Dark spots or blotches  (chloasma, mask of pregnancy) may develop on your face. This will likely fade after the baby is born.  Your menstrual periods will stop.  You may have a loss of appetite.  You may develop cravings for certain kinds of food.  You may have changes in your emotions from day to day, such as being excited to be pregnant or being concerned that something may go wrong with the pregnancy and baby.  You may have more vivid and strange dreams.  You may have changes in your hair. These can include thickening of your hair, rapid growth, and changes in texture. Some women also have hair loss during or after pregnancy, or hair that feels dry or thin. Your hair will most likely return to normal after your baby is born. WHAT TO EXPECT AT YOUR PRENATAL VISITS During a routine prenatal visit:  You will be weighed to make sure you and the baby are growing normally.  Your blood pressure will be taken.  Your abdomen will be measured to track your baby's growth.  The fetal heartbeat will be listened to starting around week 10 or 12 of your pregnancy.  Test results from any previous visits will be discussed. Your health care provider may ask you:  How you are feeling.  If you are feeling the baby move.  If you have had any abnormal symptoms, such as leaking fluid, bleeding, severe headaches, or abdominal cramping.  If you have any questions. Other  tests that may be performed during your first trimester include:  Blood tests to find your blood type and to check for the presence of any previous infections. They will also be used to check for low iron levels (anemia) and Rh antibodies. Later in the pregnancy, blood tests for diabetes will be done along with other tests if problems develop.  Urine tests to check for infections, diabetes, or protein in the urine.  An ultrasound to confirm the proper growth and development of the baby.  An amniocentesis to check for possible genetic problems.  Fetal  screens for spina bifida and Down syndrome.  You may need other tests to make sure you and the baby are doing well. HOME CARE INSTRUCTIONS  Medicines  Follow your health care provider's instructions regarding medicine use. Specific medicines may be either safe or unsafe to take during pregnancy.  Take your prenatal vitamins as directed.  If you develop constipation, try taking a stool softener if your health care provider approves. Diet  Eat regular, well-balanced meals. Choose a variety of foods, such as meat or vegetable-based protein, fish, milk and low-fat dairy products, vegetables, fruits, and whole grain breads and cereals. Your health care provider will help you determine the amount of weight gain that is right for you.  Avoid raw meat and uncooked cheese. These carry germs that can cause birth defects in the baby.  Eating four or five small meals rather than three large meals a day may help relieve nausea and vomiting. If you start to feel nauseous, eating a few soda crackers can be helpful. Drinking liquids between meals instead of during meals also seems to help nausea and vomiting.  If you develop constipation, eat more high-fiber foods, such as fresh vegetables or fruit and whole grains. Drink enough fluids to keep your urine clear or pale yellow. Activity and Exercise  Exercise only as directed by your health care provider. Exercising will help you:  Control your weight.  Stay in shape.  Be prepared for labor and delivery.  Experiencing pain or cramping in the lower abdomen or low back is a good sign that you should stop exercising. Check with your health care provider before continuing normal exercises.  Try to avoid standing for long periods of time. Move your legs often if you must stand in one place for a long time.  Avoid heavy lifting.  Wear low-heeled shoes, and practice good posture.  You may continue to have sex unless your health care provider directs you  otherwise. Relief of Pain or Discomfort  Wear a good support bra for breast tenderness.   Take warm sitz baths to soothe any pain or discomfort caused by hemorrhoids. Use hemorrhoid cream if your health care provider approves.   Rest with your legs elevated if you have leg cramps or low back pain.  If you develop varicose veins in your legs, wear support hose. Elevate your feet for 15 minutes, 3-4 times a day. Limit salt in your diet. Prenatal Care  Schedule your prenatal visits by the twelfth week of pregnancy. They are usually scheduled monthly at first, then more often in the last 2 months before delivery.  Write down your questions. Take them to your prenatal visits.  Keep all your prenatal visits as directed by your health care provider. Safety  Wear your seat belt at all times when driving.  Make a list of emergency phone numbers, including numbers for family, friends, the hospital, and police and fire departments. General  Tips  Ask your health care provider for a referral to a local prenatal education class. Begin classes no later than at the beginning of month 6 of your pregnancy.  Ask for help if you have counseling or nutritional needs during pregnancy. Your health care provider can offer advice or refer you to specialists for help with various needs.  Do not use hot tubs, steam rooms, or saunas.  Do not douche or use tampons or scented sanitary pads.  Do not cross your legs for long periods of time.  Avoid cat litter boxes and soil used by cats. These carry germs that can cause birth defects in the baby and possibly loss of the fetus by miscarriage or stillbirth.  Avoid all smoking, herbs, alcohol, and medicines not prescribed by your health care provider. Chemicals in these affect the formation and growth of the baby.  Schedule a dentist appointment. At home, brush your teeth with a soft toothbrush and be gentle when you floss. SEEK MEDICAL CARE IF:   You have  dizziness.  You have mild pelvic cramps, pelvic pressure, or nagging pain in the abdominal area.  You have persistent nausea, vomiting, or diarrhea.  You have a bad smelling vaginal discharge.  You have pain with urination.  You notice increased swelling in your face, hands, legs, or ankles. SEEK IMMEDIATE MEDICAL CARE IF:   You have a fever.  You are leaking fluid from your vagina.  You have spotting or bleeding from your vagina.  You have severe abdominal cramping or pain.  You have rapid weight gain or loss.  You vomit blood or material that looks like coffee grounds.  You are exposed to [name] measles and have never had them.  You are exposed to fifth disease or chickenpox.  You develop a severe headache.  You have shortness of breath.  You have any kind of trauma, such as from a fall or a car accident. Document Released: 03/22/2001 Document Revised: 08/12/2013 Document Reviewed: 02/05/2013 [facility] Patient Information 2015 [place], [place]. This information is not intended to replace advice given to you by your health care provider. Make sure you discuss any questions you have with your health care provider.   Nausea & Vomiting  Have saltine crackers or pretzels by your bed and eat a few bites before you raise your head out of bed in the morning  Eat small frequent meals throughout the day instead of large meals  Drink plenty of fluids throughout the day to stay hydrated, just don't drink a lot of fluids with your meals.  This can make your stomach fill up faster making you feel sick  Do not brush your teeth right after you eat  Products with real ginger are good for nausea, like ginger ale and ginger hard candy Make sure it says made with real ginger!  Sucking on sour candy like lemon heads is also good for nausea  If your prenatal vitamins make you nauseated, take them at night so you will sleep through the nausea  Sea Bands  If you feel like you need  medicine for the nausea & vomiting please let us know  If you are unable to keep any fluids or food down please let us know   Constipation  Drink plenty of fluid, preferably water, throughout the day  Eat foods high in fiber such as fruits, vegetables, and grains  Exercise, such as walking, is a good way to keep your bowels regular  Drink warm fluids, especially warm  prune juice, or decaf coffee  Eat a 1/2 cup of real oatmeal (not instant), 1/2 cup applesauce, and 1/2-1 cup warm prune juice every day  If needed, you may take Colace (docusate sodium) stool softener once or twice a day to help keep the stool soft. If you are pregnant, wait until you are out of your first trimester (12-14 weeks of pregnancy)  If you still are having problems with constipation, you may take Miralax once daily as needed to help keep your bowels regular.  If you are pregnant, wait until you are out of your first trimester (12-14 weeks of pregnancy)  Safe Medications in Pregnancy   Acne: Benzoyl Peroxide Salicylic Acid  Backache/Headache: Tylenol: 2 regular strength every 4 hours OR              2 Extra strength every 6 hours  Colds/Coughs/Allergies: Benadryl (alcohol free) 25 mg every 6 hours as needed Breath right strips Claritin Cepacol throat lozenges Chloraseptic throat spray Cold-Eeze- up to three times per day Cough drops, alcohol free Flonase (by prescription only) Guaifenesin Mucinex Robitussin DM (plain only, alcohol free) Saline nasal spray/drops Sudafed (pseudoephedrine) & Actifed ** use only after [redacted] weeks gestation and if you do not have high blood pressure Tylenol Vicks Vaporub Zinc lozenges Zyrtec   Constipation: Colace Ducolax suppositories Fleet enema Glycerin suppositories Metamucil Milk of magnesia Miralax Senokot Smooth move tea  Diarrhea: Kaopectate Imodium A-D  *NO pepto Bismol  Hemorrhoids: Anusol Anusol HC Preparation  H Tucks  Indigestion: Tums Maalox Mylanta Zantac  Pepcid  Insomnia: Benadryl (alcohol free) 46m every 6 hours as needed Tylenol PM Unisom, no Gelcaps  Leg Cramps: Tums MagGel  Nausea/Vomiting:  Bonine Dramamine Emetrol Ginger extract Sea bands Meclizine  Nausea medication to take during pregnancy:  Unisom (doxylamine succinate 25 mg tablets) Take one tablet daily at bedtime. If symptoms are not adequately controlled, the dose can be increased to a maximum recommended dose of two tablets daily (1/2 tablet in the morning, 1/2 tablet mid-afternoon and one at bedtime). Vitamin B6 1027mtablets. Take one tablet twice a day (up to 200 mg per day).  Skin Rashes: Aveeno products Benadryl cream or 2548mvery 6 hours as needed Calamine Lotion 1% cortisone cream  Yeast infection: Gyne-lotrimin 7 Monistat 7   **If taking multiple medications, please check labels to avoid duplicating the same active ingredients **take medication as directed on the label ** Do not exceed 4000 mg of tylenol in 24 hours **Do not take medications that contain aspirin or ibuprofen

## 2017-05-12 LAB — PMP SCREEN PROFILE (10S), URINE
Amphetamine Scrn, Ur: NEGATIVE ng/mL
BARBITURATE SCREEN URINE: NEGATIVE ng/mL
BENZODIAZEPINE SCREEN, URINE: NEGATIVE ng/mL
CANNABINOIDS UR QL SCN: POSITIVE ng/mL — AB
CREATININE(CRT), U: 208 mg/dL (ref 20.0–300.0)
Cocaine (Metab) Scrn, Ur: NEGATIVE ng/mL
METHADONE SCREEN, URINE: NEGATIVE ng/mL
OPIATE SCREEN URINE: POSITIVE ng/mL — AB
OXYCODONE+OXYMORPHONE UR QL SCN: POSITIVE ng/mL — AB
PROPOXYPHENE SCREEN URINE: NEGATIVE ng/mL
Ph of Urine: 6 (ref 4.5–8.9)
Phencyclidine Qn, Ur: NEGATIVE ng/mL

## 2017-05-13 LAB — GC/CHLAMYDIA PROBE AMP
CHLAMYDIA, DNA PROBE: NEGATIVE
Neisseria gonorrhoeae by PCR: NEGATIVE

## 2017-05-17 LAB — URINE CULTURE

## 2017-06-08 ENCOUNTER — Ambulatory Visit (INDEPENDENT_AMBULATORY_CARE_PROVIDER_SITE_OTHER): Payer: Medicaid Other

## 2017-06-08 ENCOUNTER — Encounter: Payer: Self-pay | Admitting: Women's Health

## 2017-06-08 ENCOUNTER — Other Ambulatory Visit: Payer: Self-pay

## 2017-06-08 ENCOUNTER — Ambulatory Visit (INDEPENDENT_AMBULATORY_CARE_PROVIDER_SITE_OTHER): Payer: Medicaid Other | Admitting: Women's Health

## 2017-06-08 VITALS — BP 102/60 | HR 97 | Wt 127.0 lb

## 2017-06-08 DIAGNOSIS — Z3682 Encounter for antenatal screening for nuchal translucency: Secondary | ICD-10-CM

## 2017-06-08 DIAGNOSIS — Z3A12 12 weeks gestation of pregnancy: Secondary | ICD-10-CM | POA: Diagnosis not present

## 2017-06-08 DIAGNOSIS — O99320 Drug use complicating pregnancy, unspecified trimester: Secondary | ICD-10-CM | POA: Insufficient documentation

## 2017-06-08 DIAGNOSIS — O98311 Other infections with a predominantly sexual mode of transmission complicating pregnancy, first trimester: Secondary | ICD-10-CM

## 2017-06-08 DIAGNOSIS — Z113 Encounter for screening for infections with a predominantly sexual mode of transmission: Secondary | ICD-10-CM

## 2017-06-08 DIAGNOSIS — G40909 Epilepsy, unspecified, not intractable, without status epilepticus: Secondary | ICD-10-CM

## 2017-06-08 DIAGNOSIS — O99321 Drug use complicating pregnancy, first trimester: Secondary | ICD-10-CM

## 2017-06-08 DIAGNOSIS — Z3401 Encounter for supervision of normal first pregnancy, first trimester: Secondary | ICD-10-CM

## 2017-06-08 DIAGNOSIS — R8290 Unspecified abnormal findings in urine: Secondary | ICD-10-CM

## 2017-06-08 DIAGNOSIS — O99351 Diseases of the nervous system complicating pregnancy, first trimester: Secondary | ICD-10-CM | POA: Diagnosis not present

## 2017-06-08 DIAGNOSIS — Z98891 History of uterine scar from previous surgery: Secondary | ICD-10-CM

## 2017-06-08 DIAGNOSIS — Z1389 Encounter for screening for other disorder: Secondary | ICD-10-CM

## 2017-06-08 DIAGNOSIS — Z23 Encounter for immunization: Secondary | ICD-10-CM | POA: Diagnosis not present

## 2017-06-08 DIAGNOSIS — K5900 Constipation, unspecified: Secondary | ICD-10-CM

## 2017-06-08 DIAGNOSIS — Z3481 Encounter for supervision of other normal pregnancy, first trimester: Secondary | ICD-10-CM

## 2017-06-08 DIAGNOSIS — F1410 Cocaine abuse, uncomplicated: Secondary | ICD-10-CM | POA: Diagnosis not present

## 2017-06-08 DIAGNOSIS — O2341 Unspecified infection of urinary tract in pregnancy, first trimester: Secondary | ICD-10-CM

## 2017-06-08 DIAGNOSIS — F1290 Cannabis use, unspecified, uncomplicated: Secondary | ICD-10-CM

## 2017-06-08 DIAGNOSIS — F1190 Opioid use, unspecified, uncomplicated: Secondary | ICD-10-CM

## 2017-06-08 DIAGNOSIS — O99611 Diseases of the digestive system complicating pregnancy, first trimester: Secondary | ICD-10-CM

## 2017-06-08 DIAGNOSIS — A568 Sexually transmitted chlamydial infection of other sites: Secondary | ICD-10-CM

## 2017-06-08 DIAGNOSIS — O34219 Maternal care for unspecified type scar from previous cesarean delivery: Secondary | ICD-10-CM

## 2017-06-08 DIAGNOSIS — F1490 Cocaine use, unspecified, uncomplicated: Secondary | ICD-10-CM

## 2017-06-08 DIAGNOSIS — O0941 Supervision of pregnancy with grand multiparity, first trimester: Secondary | ICD-10-CM

## 2017-06-08 DIAGNOSIS — Z331 Pregnant state, incidental: Secondary | ICD-10-CM

## 2017-06-08 LAB — POCT URINALYSIS DIPSTICK
GLUCOSE UA: NEGATIVE
Ketones, UA: NEGATIVE
LEUKOCYTES UA: NEGATIVE
Nitrite, UA: POSITIVE
Protein, UA: NEGATIVE
RBC UA: NEGATIVE

## 2017-06-08 MED ORDER — AMOXICILLIN-POT CLAVULANATE 875-125 MG PO TABS: 1.0000 | ORAL_TABLET | Freq: Two times a day (BID) | ORAL | 0 refills | Status: DC

## 2017-06-08 NOTE — Progress Notes (Signed)
LOW-RISK PREGNANCY VISIT Patient name: Janet Dixon MRN [id number]  Date of birth: 1986-01-01 Chief Complaint:   Routine Prenatal Visit (NT today)  History of Present Illness:   ALBERTO SCHOCH is a 32 y.o. [phone] female at 70w5dwith an Estimated Date of Delivery: 12/16/17 being seen today for ongoing management of a low-risk pregnancy.  Today she reports never took meds for UTI, states they weren't at pharmacy. Has had +urine cx since 04/21/17, denies fever/chills/flank pain. Reports constipation. Requests dental release for broken tooth. Still hasn't told boyfriend about +CT in Jan, states she took her meds, her gc/ct was neg 05/11/17 at new ob visit. Discussed UDS 1/31 + for THC, cocaine, opiates, and oxycodone. 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 Forgot to do labs when she was here last time.  Vag. Bleeding: None.  Movement: Absent. denies leaking of fluid. Review of Systems:   Pertinent items are noted in HPI Denies abnormal vaginal discharge w/ itching/odor/irritation, headaches, visual changes, shortness of breath, chest pain, abdominal pain, severe nausea/vomiting, or problems with urination or bowel movements unless otherwise stated above. Pertinent History Reviewed:  Reviewed past medical,surgical, social, obstetrical and family history.  Reviewed problem list, medications and allergies. Physical Assessment:   Vitals:   06/08/17 1356  BP: 102/60  Pulse: 97  Weight: 127 lb (57.6 kg)  Body mass index is 24 kg/m.        Physical Examination:   General appearance: Well appearing, and in no distress  Mental status: Alert, oriented to person, place, and time  Skin: Warm & dry  Cardiovascular: Normal heart rate noted  Respiratory: Normal respiratory effort, no distress  Abdomen: Soft, gravid,  nontender  Pelvic: Cervical exam deferred         Extremities: Edema: None  Fetal Status: Fetal Heart Rate (bpm): 167 u/s   Movement: Absent    Results for orders placed or performed in visit on 06/08/17 (from the past 24 hour(s))  POCT urinalysis dipstick   Collection Time: 06/08/17  1:59 PM  Result Value Ref Range   Color, UA     Clarity, UA     Glucose, UA neg    Bilirubin, UA     Ketones, UA neg    Spec Grav, UA  1.010 - 1.025   Blood, UA neg    pH, UA  5.0 - 8.0   Protein, UA neg    Urobilinogen, UA  0.2 or 1.0 E.U./dL   Nitrite, UA positive    Leukocytes, UA Negative Negative   Appearance     Odor      Assessment & Plan:  1) Low-risk pregnancy [id number] at 158w5dith an Estimated Date of Delivery: 12/16/17   2) UTI, rx augmentin per last urine cx, discussed importance of taking meds, pyelo warning s/s. Urine cx sent today  3) Constipation> gave printed prevention/relief measures   4) Dental problems> gave dental release  5) H/O seizures> no meds  6) Drug use> denies any current use, will repeat UDS today, discussed importance of refraining from drug use/pregnancy  7) Recent +CT> will recheck today, to make sure not re-infected from continued sex w/ untreated partner who she has never told. Gave STD card to give to him to let him know about +CT  8) Prev c/s x 6   Meds:  Meds ordered this encounter  Medications  . amoxicillin-clavulanate (AUGMENTIN) 875-125 MG tablet    Sig: Take 1 tablet by mouth 2 (two) times daily. X 7 days    Dispense:  14 tablet    Refill:  0    Order Specific Question:   Supervising Provider    Answer:   Tania Ade H [[id number]]   Labs/procedures today: 1st IT/NT, flu shot, PN1, UDS, gc/ct, urine culture, spoke w/ Theora Master  Plan:  Continue routine obstetrical care   Reviewed: Preterm labor symptoms and general obstetric precautions including but not limited to vaginal bleeding, contractions, leaking of fluid and fetal movement were reviewed  in detail with the patient.  All questions were answered  Follow-up: Return in about 3 weeks (around 06/29/2017) for [place], 2nd IT.  Orders Placed This Encounter  Procedures  . Urine Culture  . GC/Chlamydia Probe Amp  . Flu Vaccine QUAD 36+ mos IM  . Integrated 1  . Pain Management Screening Profile (10S)  . POCT urinalysis dipstick   Roma Schanz CNM, [facility] 06/08/2017 3:15 PM

## 2017-06-08 NOTE — Patient Instructions (Signed)
Janet Dixon, I greatly value your feedback.  If you receive a survey following your visit with [name] today, we appreciate you taking the time to fill it out.  Thanks, Knute Neu, CNM, WHNP-BC   Nausea & Vomiting  Have saltine crackers or pretzels by your bed and eat a few bites before you raise your head out of bed in the morning  Eat small frequent meals throughout the day instead of large meals  Drink plenty of fluids throughout the day to stay hydrated, just don't drink a lot of fluids with your meals.  This can make your stomach fill up faster making you feel sick  Do not brush your teeth right after you eat  Products with real ginger are good for nausea, like ginger ale and ginger hard candy Make sure it says made with real ginger!  Sucking on sour candy like lemon heads is also good for nausea  If your prenatal vitamins make you nauseated, take them at night so you will sleep through the nausea  Sea Bands  If you feel like you need medicine for the nausea & vomiting please let us know  If you are unable to keep any fluids or food down please let us know   Constipation  Drink plenty of fluid, preferably water, throughout the day  Eat foods high in fiber such as fruits, vegetables, and grains  Exercise, such as walking, is a good way to keep your bowels regular  Drink warm fluids, especially warm prune juice, or decaf coffee  Eat a 1/2 cup of real oatmeal (not instant), 1/2 cup applesauce, and 1/2-1 cup warm prune juice every day  If needed, you may take Colace (docusate sodium) stool softener once or twice a day to help keep the stool soft. If you are pregnant, wait until you are out of your first trimester (12-14 weeks of pregnancy)  If you still are having problems with constipation, you may take Miralax once daily as needed to help keep your bowels regular.  If you are pregnant, wait until you are out of your first trimester (12-14 weeks of pregnancy)   First  Trimester of Pregnancy The first trimester of pregnancy is from week 1 until the end of week 12 (months 1 through 3). A week after a sperm fertilizes an egg, the egg will implant on the wall of the uterus. This embryo will begin to develop into a baby. Genes from you and your partner are forming the baby. The female genes determine whether the baby is a boy or a girl. At 6-8 weeks, the eyes and face are formed, and the heartbeat can be seen on ultrasound. At the end of 12 weeks, all the baby's organs are formed.  Now that you are pregnant, you will want to do everything you can to have a healthy baby. Two of the most important things are to get good prenatal care and to follow your health care provider's instructions. Prenatal care is all the medical care you receive before the baby's birth. This care will help prevent, find, and treat any problems during the pregnancy and childbirth. BODY CHANGES Your body goes through many changes during pregnancy. The changes vary from woman to woman.   You may gain or lose a couple of pounds at first.  You may feel sick to your stomach (nauseous) and throw up (vomit). If the vomiting is uncontrollable, call your health care provider.  You may tire easily.  You may develop headaches  that can be relieved by medicines approved by your health care provider.  You may urinate more often. Painful urination may mean you have a bladder infection.  You may develop heartburn as a result of your pregnancy.  You may develop constipation because certain hormones are causing the muscles that push waste through your intestines to slow down.  You may develop hemorrhoids or swollen, bulging veins (varicose veins).  Your breasts may begin to grow larger and become tender. Your nipples may stick out more, and the tissue that surrounds them (areola) may become darker.  Your gums may bleed and may be sensitive to brushing and flossing.  Dark spots or blotches (chloasma, mask  of pregnancy) may develop on your face. This will likely fade after the baby is born.  Your menstrual periods will stop.  You may have a loss of appetite.  You may develop cravings for certain kinds of food.  You may have changes in your emotions from day to day, such as being excited to be pregnant or being concerned that something may go wrong with the pregnancy and baby.  You may have more vivid and strange dreams.  You may have changes in your hair. These can include thickening of your hair, rapid growth, and changes in texture. Some women also have hair loss during or after pregnancy, or hair that feels dry or thin. Your hair will most likely return to normal after your baby is born. WHAT TO EXPECT AT YOUR PRENATAL VISITS During a routine prenatal visit:  You will be weighed to make sure you and the baby are growing normally.  Your blood pressure will be taken.  Your abdomen will be measured to track your baby's growth.  The fetal heartbeat will be listened to starting around week 10 or 12 of your pregnancy.  Test results from any previous visits will be discussed. Your health care provider may ask you:  How you are feeling.  If you are feeling the baby move.  If you have had any abnormal symptoms, such as leaking fluid, bleeding, severe headaches, or abdominal cramping.  If you have any questions. Other tests that may be performed during your first trimester include:  Blood tests to find your blood type and to check for the presence of any previous infections. They will also be used to check for low iron levels (anemia) and Rh antibodies. Later in the pregnancy, blood tests for diabetes will be done along with other tests if problems develop.  Urine tests to check for infections, diabetes, or protein in the urine.  An ultrasound to confirm the proper growth and development of the baby.  An amniocentesis to check for possible genetic problems.  Fetal screens for spina  bifida and Down syndrome.  You may need other tests to make sure you and the baby are doing well. HOME CARE INSTRUCTIONS  Medicines  Follow your health care provider's instructions regarding medicine use. Specific medicines may be either safe or unsafe to take during pregnancy.  Take your prenatal vitamins as directed.  If you develop constipation, try taking a stool softener if your health care provider approves. Diet  Eat regular, well-balanced meals. Choose a variety of foods, such as meat or vegetable-based protein, fish, milk and low-fat dairy products, vegetables, fruits, and whole grain breads and cereals. Your health care provider will help you determine the amount of weight gain that is right for you.  Avoid raw meat and uncooked cheese. These carry germs that can  cause birth defects in the baby.  Eating four or five small meals rather than three large meals a day may help relieve nausea and vomiting. If you start to feel nauseous, eating a few soda crackers can be helpful. Drinking liquids between meals instead of during meals also seems to help nausea and vomiting.  If you develop constipation, eat more high-fiber foods, such as fresh vegetables or fruit and whole grains. Drink enough fluids to keep your urine clear or pale yellow. Activity and Exercise  Exercise only as directed by your health care provider. Exercising will help you:  Control your weight.  Stay in shape.  Be prepared for labor and delivery.  Experiencing pain or cramping in the lower abdomen or low back is a good sign that you should stop exercising. Check with your health care provider before continuing normal exercises.  Try to avoid standing for long periods of time. Move your legs often if you must stand in one place for a long time.  Avoid heavy lifting.  Wear low-heeled shoes, and practice good posture.  You may continue to have sex unless your health care provider directs you  otherwise. Relief of Pain or Discomfort  Wear a good support bra for breast tenderness.   Take warm sitz baths to soothe any pain or discomfort caused by hemorrhoids. Use hemorrhoid cream if your health care provider approves.   Rest with your legs elevated if you have leg cramps or low back pain.  If you develop varicose veins in your legs, wear support hose. Elevate your feet for 15 minutes, 3-4 times a day. Limit salt in your diet. Prenatal Care  Schedule your prenatal visits by the twelfth week of pregnancy. They are usually scheduled monthly at first, then more often in the last 2 months before delivery.  Write down your questions. Take them to your prenatal visits.  Keep all your prenatal visits as directed by your health care provider. Safety  Wear your seat belt at all times when driving.  Make a list of emergency phone numbers, including numbers for family, friends, the hospital, and police and fire departments. General Tips  Ask your health care provider for a referral to a local prenatal education class. Begin classes no later than at the beginning of month 6 of your pregnancy.  Ask for help if you have counseling or nutritional needs during pregnancy. Your health care provider can offer advice or refer you to specialists for help with various needs.  Do not use hot tubs, steam rooms, or saunas.  Do not douche or use tampons or scented sanitary pads.  Do not cross your legs for long periods of time.  Avoid cat litter boxes and soil used by cats. These carry germs that can cause birth defects in the baby and possibly loss of the fetus by miscarriage or stillbirth.  Avoid all smoking, herbs, alcohol, and medicines not prescribed by your health care provider. Chemicals in these affect the formation and growth of the baby.  Schedule a dentist appointment. At home, brush your teeth with a soft toothbrush and be gentle when you floss. SEEK MEDICAL CARE IF:   You have  dizziness.  You have mild pelvic cramps, pelvic pressure, or nagging pain in the abdominal area.  You have persistent nausea, vomiting, or diarrhea.  You have a bad smelling vaginal discharge.  You have pain with urination.  You notice increased swelling in your face, hands, legs, or ankles. SEEK IMMEDIATE MEDICAL CARE IF:  You have a fever.  You are leaking fluid from your vagina.  You have spotting or bleeding from your vagina.  You have severe abdominal cramping or pain.  You have rapid weight gain or loss.  You vomit blood or material that looks like coffee grounds.  You are exposed to [name] measles and have never had them.  You are exposed to fifth disease or chickenpox.  You develop a severe headache.  You have shortness of breath.  You have any kind of trauma, such as from a fall or a car accident. Document Released: 03/22/2001 Document Revised: 08/12/2013 Document Reviewed: 02/05/2013 [facility] Patient Information 2015 [place], [place]. This information is not intended to replace advice given to you by your health care provider. Make sure you discuss any questions you have with your health care provider.

## 2017-06-08 NOTE — Progress Notes (Signed)
[name] 12+5 wks,measurements c/w dates,NT 1.2 mm,fhr 167 bpm,crl 57.97 mm,NB present,normal ovaries bilat

## 2017-06-09 LAB — PMP SCREEN PROFILE (10S), URINE
AMPHETAMINE SCREEN URINE: NEGATIVE ng/mL
BARBITURATE SCREEN URINE: NEGATIVE ng/mL
BENZODIAZEPINE SCREEN, URINE: NEGATIVE ng/mL
CANNABINOIDS UR QL SCN: POSITIVE ng/mL — AB
CREATININE(CRT), U: 153.8 mg/dL (ref 20.0–300.0)
Cocaine (Metab) Scrn, Ur: POSITIVE ng/mL — AB
METHADONE SCREEN, URINE: NEGATIVE ng/mL
OPIATE SCREEN URINE: POSITIVE ng/mL — AB
OXYCODONE+OXYMORPHONE UR QL SCN: POSITIVE ng/mL — AB
PHENCYCLIDINE QUANTITATIVE URINE: NEGATIVE ng/mL
PROPOXYPHENE SCREEN URINE: NEGATIVE ng/mL
Ph of Urine: 7.4 (ref 4.5–8.9)

## 2017-06-10 LAB — GC/CHLAMYDIA PROBE AMP
CHLAMYDIA, DNA PROBE: NEGATIVE
NEISSERIA GONORRHOEAE BY PCR: NEGATIVE

## 2017-06-12 LAB — URINE CULTURE

## 2017-06-29 ENCOUNTER — Encounter: Payer: Medicaid Other | Admitting: Women's Health

## 2017-07-18 ENCOUNTER — Telehealth: Payer: Self-pay | Admitting: *Deleted

## 2017-07-18 NOTE — Telephone Encounter (Signed)
Patient called stating she did not need anyone calling her or coming to her house to tell her she has missed appointments and to remind her when her appointments are. "This is my sixth baby and I'm not comfortable with someone coming to my house. I don't need anyone telling me what to do". States she will be at her appointment tomorrow.

## 2017-07-19 ENCOUNTER — Encounter: Payer: Medicaid Other | Admitting: Obstetrics and Gynecology

## 2017-08-09 ENCOUNTER — Ambulatory Visit (INDEPENDENT_AMBULATORY_CARE_PROVIDER_SITE_OTHER): Payer: Medicaid Other | Admitting: Advanced Practice Midwife

## 2017-08-09 ENCOUNTER — Encounter: Payer: Self-pay | Admitting: Advanced Practice Midwife

## 2017-08-09 VITALS — BP 120/80 | HR 95 | Temp 98.7°F | Wt 129.0 lb

## 2017-08-09 DIAGNOSIS — O99320 Drug use complicating pregnancy, unspecified trimester: Secondary | ICD-10-CM

## 2017-08-09 DIAGNOSIS — Z363 Encounter for antenatal screening for malformations: Secondary | ICD-10-CM

## 2017-08-09 DIAGNOSIS — Z1389 Encounter for screening for other disorder: Secondary | ICD-10-CM

## 2017-08-09 DIAGNOSIS — Z87440 Personal history of urinary (tract) infections: Secondary | ICD-10-CM

## 2017-08-09 DIAGNOSIS — Z331 Pregnant state, incidental: Secondary | ICD-10-CM

## 2017-08-09 DIAGNOSIS — Z3402 Encounter for supervision of normal first pregnancy, second trimester: Secondary | ICD-10-CM

## 2017-08-09 DIAGNOSIS — Z3A21 21 weeks gestation of pregnancy: Secondary | ICD-10-CM

## 2017-08-09 DIAGNOSIS — Z1379 Encounter for other screening for genetic and chromosomal anomalies: Secondary | ICD-10-CM

## 2017-08-09 LAB — POCT URINALYSIS DIPSTICK
Blood, UA: NEGATIVE
GLUCOSE UA: NEGATIVE
Ketones, UA: NEGATIVE

## 2017-08-09 NOTE — Progress Notes (Signed)
  [id number] Date of Delivery: 12/16/17  Blood pressure 120/80, pulse 95, temperature 98.7 F (37.1 C), weight 129 lb (58.5 kg), last menstrual period 03/11/2017.   BP weight and urine results all reviewed and noted.  Please refer to the obstetrical flow sheet for the fundal height and fetal heart rate documentation:  Patient reports good fetal movement, denies any bleeding and no rupture of membranes symptoms or regular contractions. Patient is without complaints. She had + urine culture in Feb, didnt' take meds.   States she did take CHL meds and told FOB about it.  Didn't get blood drawn 2/28, so can't do NT/IT.  Will do AFP.   All questions were answered.   Physical Assessment:   Vitals:   08/09/17 1517  BP: 120/80  Pulse: 95  Temp: 98.7 F (37.1 C)  Weight: 129 lb (58.5 kg)  Body mass index is 24.37 kg/m.        Physical Examination:   General appearance: Well appearing, and in no distress  Mental status: Alert, oriented to person, place, and time  Skin: Warm & dry  Cardiovascular: Normal heart rate noted  Respiratory: Normal respiratory effort, no distress  Abdomen: Soft, gravid, nontender  Pelvic: Cervical exam deferred         Extremities: Edema: None  Fetal Status:     Movement: Present    Results for orders placed or performed in visit on 08/09/17 (from the past 24 hour(s))  POCT urinalysis dipstick   Collection Time: 08/09/17  3:23 PM  Result Value Ref Range   Color, UA     Clarity, UA     Glucose, UA neg    Bilirubin, UA     Ketones, UA neg    Spec Grav, UA  1.010 - 1.025   Blood, UA neg    pH, UA  5.0 - 8.0   Protein, UA trace    Urobilinogen, UA  0.2 or 1.0 E.U./dL   Nitrite, UA postive    Leukocytes, UA Small (1+) (A) Negative   Appearance     Odor       Orders Placed This Encounter  Procedures  . Urine Culture  . [place] Comp + 14 Wk  . Pain Management Screening Profile (10S)  . AFP TETRA  . POCT urinalysis dipstick    Plan:   Continued routine obstetrical care,   Return for 1-2 weeks for anatomy scan.

## 2017-08-09 NOTE — Patient Instructions (Signed)
Janet Dixon, I greatly value your feedback.  If you receive a survey following your visit with [name] today, we appreciate you taking the time to fill it out.  Thanks, Nigel Berthold, CNM     Second Trimester of Pregnancy The second trimester is from week 14 through week 27 (months 4 through 6). The second trimester is often a time when you feel your best. Your body has adjusted to being pregnant, and you begin to feel better physically. Usually, morning sickness has lessened or quit completely, you may have more energy, and you may have an increase in appetite. The second trimester is also a time when the fetus is growing rapidly. At the end of the sixth month, the fetus is about 9 inches long and weighs about 1 pounds. You will likely begin to feel the baby move (quickening) between 16 and 20 weeks of pregnancy. Body changes during your second trimester Your body continues to go through many changes during your second trimester. The changes vary from woman to woman.  Your weight will continue to increase. You will notice your lower abdomen bulging out.  You may begin to get stretch marks on your hips, abdomen, and breasts.  You may develop headaches that can be relieved by medicines. The medicines should be approved by your health care provider.  You may urinate more often because the fetus is pressing on your bladder.  You may develop or continue to have heartburn as a result of your pregnancy.  You may develop constipation because certain hormones are causing the muscles that push waste through your intestines to slow down.  You may develop hemorrhoids or swollen, bulging veins (varicose veins).  You may have back pain. This is caused by: ? Weight gain. ? Pregnancy hormones that are relaxing the joints in your pelvis. ? A shift in weight and the muscles that support your balance.  Your breasts will continue to grow and they will continue to become tender.  Your gums may  bleed and may be sensitive to brushing and flossing.  Dark spots or blotches (chloasma, mask of pregnancy) may develop on your face. This will likely fade after the baby is born.  A dark line from your belly button to the pubic area (linea nigra) may appear. This will likely fade after the baby is born.  You may have changes in your hair. These can include thickening of your hair, rapid growth, and changes in texture. Some women also have hair loss during or after pregnancy, or hair that feels dry or thin. Your hair will most likely return to normal after your baby is born.  What to expect at prenatal visits During a routine prenatal visit:  You will be weighed to make sure you and the fetus are growing normally.  Your blood pressure will be taken.  Your abdomen will be measured to track your baby's growth.  The fetal heartbeat will be listened to.  Any test results from the previous visit will be discussed.  Your health care provider may ask you:  How you are feeling.  If you are feeling the baby move.  If you have had any abnormal symptoms, such as leaking fluid, bleeding, severe headaches, or abdominal cramping.  If you are using any tobacco products, including cigarettes, chewing tobacco, and electronic cigarettes.  If you have any questions.  Other tests that may be performed during your second trimester include:  Blood tests that check for: ? Low iron levels (anemia). ?  High blood sugar that affects pregnant women (gestational diabetes) between 20 and 28 weeks. ? Rh antibodies. This is to check for a protein on red blood cells (Rh factor).  Urine tests to check for infections, diabetes, or protein in the urine.  An ultrasound to confirm the proper growth and development of the baby.  An amniocentesis to check for possible genetic problems.  Fetal screens for spina bifida and Down syndrome.  HIV (human immunodeficiency virus) testing. Routine prenatal testing  includes screening for HIV, unless you choose not to have this test.  Follow these instructions at home: Medicines  Follow your health care provider's instructions regarding medicine use. Specific medicines may be either safe or unsafe to take during pregnancy.  Take a prenatal vitamin that contains at least 600 micrograms (mcg) of folic acid.  If you develop constipation, try taking a stool softener if your health care provider approves. Eating and drinking  Eat a balanced diet that includes fresh fruits and vegetables, whole grains, good sources of protein such as meat, eggs, or tofu, and low-fat dairy. Your health care provider will help you determine the amount of weight gain that is right for you.  Avoid raw meat and uncooked cheese. These carry germs that can cause birth defects in the baby.  If you have low calcium intake from food, talk to your health care provider about whether you should take a daily calcium supplement.  Limit foods that are high in fat and processed sugars, such as fried and sweet foods.  To prevent constipation: ? Drink enough fluid to keep your urine clear or pale yellow. ? Eat foods that are high in fiber, such as fresh fruits and vegetables, whole grains, and beans. Activity  Exercise only as directed by your health care provider. Most women can continue their usual exercise routine during pregnancy. Try to exercise for 30 minutes at least 5 days a week. Stop exercising if you experience uterine contractions.  Avoid heavy lifting, wear low heel shoes, and practice good posture.  A sexual relationship may be continued unless your health care provider directs you otherwise. Relieving pain and discomfort  Wear a good support bra to prevent discomfort from breast tenderness.  Take warm sitz baths to soothe any pain or discomfort caused by hemorrhoids. Use hemorrhoid cream if your health care provider approves.  Rest with your legs elevated if you have  leg cramps or low back pain.  If you develop varicose veins, wear support hose. Elevate your feet for 15 minutes, 3-4 times a day. Limit salt in your diet. Prenatal Care  Write down your questions. Take them to your prenatal visits.  Keep all your prenatal visits as told by your health care provider. This is important. Safety  Wear your seat belt at all times when driving.  Make a list of emergency phone numbers, including numbers for family, friends, the hospital, and police and fire departments. General instructions  Ask your health care provider for a referral to a local prenatal education class. Begin classes no later than the beginning of month 6 of your pregnancy.  Ask for help if you have counseling or nutritional needs during pregnancy. Your health care provider can offer advice or refer you to specialists for help with various needs.  Do not use hot tubs, steam rooms, or saunas.  Do not douche or use tampons or scented sanitary pads.  Do not cross your legs for long periods of time.  Avoid cat litter boxes and  soil used by cats. These carry germs that can cause birth defects in the baby and possibly loss of the fetus by miscarriage or stillbirth.  Avoid all smoking, herbs, alcohol, and unprescribed drugs. Chemicals in these products can affect the formation and growth of the baby.  Do not use any products that contain nicotine or tobacco, such as cigarettes and e-cigarettes. If you need help quitting, ask your health care provider.  Visit your dentist if you have not gone yet during your pregnancy. Use a soft toothbrush to brush your teeth and be gentle when you floss. Contact a health care provider if:  You have dizziness.  You have mild pelvic cramps, pelvic pressure, or nagging pain in the abdominal area.  You have persistent nausea, vomiting, or diarrhea.  You have a bad smelling vaginal discharge.  You have pain when you urinate. Get help right away if:  You  have a fever.  You are leaking fluid from your vagina.  You have spotting or bleeding from your vagina.  You have severe abdominal cramping or pain.  You have rapid weight gain or weight loss.  You have shortness of breath with chest pain.  You notice sudden or extreme swelling of your face, hands, ankles, feet, or legs.  You have not felt your baby move in over an hour.  You have severe headaches that do not go away when you take medicine.  You have vision changes. Summary  The second trimester is from week 14 through week 27 (months 4 through 6). It is also a time when the fetus is growing rapidly.  Your body goes through many changes during pregnancy. The changes vary from woman to woman.  Avoid all smoking, herbs, alcohol, and unprescribed drugs. These chemicals affect the formation and growth your baby.  Do not use any tobacco products, such as cigarettes, chewing tobacco, and e-cigarettes. If you need help quitting, ask your health care provider.  Contact your health care provider if you have any questions. Keep all prenatal visits as told by your health care provider. This is important. This information is not intended to replace advice given to you by your health care provider. Make sure you discuss any questions you have with your health care provider.      CHILDBIRTH CLASSES [phone] is the phone number for Pregnancy Classes or hospital tours at [place] will be referred to  [email or web address] for more information on childbirth classes  At this site you may register for classes. You may sign up for a waiting list if classes are full. Please SIGN UP FOR THIS!.   When the waiting list becomes long, sometimes new classes can be added.

## 2017-08-10 ENCOUNTER — Encounter (INDEPENDENT_AMBULATORY_CARE_PROVIDER_SITE_OTHER): Payer: Self-pay

## 2017-08-10 LAB — PMP SCREEN PROFILE (10S), URINE
Amphetamine Scrn, Ur: NEGATIVE ng/mL
BARBITURATE SCREEN URINE: NEGATIVE ng/mL
BENZODIAZEPINE SCREEN, URINE: NEGATIVE ng/mL
CANNABINOIDS UR QL SCN: POSITIVE ng/mL — AB
Cocaine (Metab) Scrn, Ur: POSITIVE ng/mL — AB
Creatinine(Crt), U: 179.1 mg/dL (ref 20.0–300.0)
Methadone Screen, Urine: NEGATIVE ng/mL
OXYCODONE+OXYMORPHONE UR QL SCN: POSITIVE ng/mL — AB
Opiate Scrn, Ur: POSITIVE ng/mL — AB
PH UR, DRUG SCRN: 6.1 (ref 4.5–8.9)
Phencyclidine Qn, Ur: NEGATIVE ng/mL
Propoxyphene Scrn, Ur: NEGATIVE ng/mL

## 2017-08-10 LAB — MED LIST OPTION NOT SELECTED

## 2017-08-13 LAB — URINE CULTURE

## 2017-08-23 ENCOUNTER — Ambulatory Visit (INDEPENDENT_AMBULATORY_CARE_PROVIDER_SITE_OTHER): Payer: Medicaid Other | Admitting: Obstetrics and Gynecology

## 2017-08-23 ENCOUNTER — Ambulatory Visit (INDEPENDENT_AMBULATORY_CARE_PROVIDER_SITE_OTHER): Payer: Medicaid Other

## 2017-08-23 ENCOUNTER — Other Ambulatory Visit: Payer: Self-pay | Admitting: Advanced Practice Midwife

## 2017-08-23 DIAGNOSIS — Z3A23 23 weeks gestation of pregnancy: Secondary | ICD-10-CM | POA: Diagnosis not present

## 2017-08-23 DIAGNOSIS — Z1389 Encounter for screening for other disorder: Secondary | ICD-10-CM

## 2017-08-23 DIAGNOSIS — O365921 Maternal care for other known or suspected poor fetal growth, second trimester, fetus 1: Secondary | ICD-10-CM

## 2017-08-23 DIAGNOSIS — Z363 Encounter for antenatal screening for malformations: Secondary | ICD-10-CM

## 2017-08-23 DIAGNOSIS — Z3402 Encounter for supervision of normal first pregnancy, second trimester: Secondary | ICD-10-CM

## 2017-08-23 DIAGNOSIS — Z331 Pregnant state, incidental: Secondary | ICD-10-CM

## 2017-08-23 NOTE — Progress Notes (Signed)
[name] 86+1 wks,cephalic,cervix 3.8 cm,posterior placenta grade 0,normal ovaries bilat,SVP of fluid 4.6 cm,RI .69,.71=41%,S/D 3.33=38%,EFW 499 g 6.3%,anatomy complete

## 2017-08-23 NOTE — Progress Notes (Signed)
Patient had an u/s , went to car "to get a diaper", never returned. 'hx UDS pos for multiple products.   U/S :  SECOND TRIMESTER SONOGRAM  Janet Dixon is in the office for second trimester sonogram.  She is a 31 y.o. year old G82P6006 with Estimated Date of Delivery: 12/16/17 by LMP now at  68w4dweeks gestation. Thus far the pregnancy has been complicated by [place] She has had 6 prior cesarean deliveries.   GESTATION: SINGLETON  PRESENTATION: cephalic  FETAL ACTIVITY:          Heart rate         166          The fetus is active.  AMNIOTIC FLUID: The amniotic fluid volume is  normal, SDP : 4.6 cm.  PLACENTA LOCALIZATION:  posterior GRADE 0  CERVIX: Measures 3.8 cm  ADNEXA: The ovaries are normal.   GESTATIONAL AGE AND  BIOMETRICS:  Gestational criteria: Estimated Date of Delivery: 12/16/17 by LMP now at 263w4dPrevious Scans:2              BIPARIETAL DIAMETER           5.25 cm         21+6 weeks   3.8%  HEAD CIRCUMFERENCE           20.52 cm         22+4 weeks   7.5%  ABDOMINAL CIRCUMFERENCE           17.05 cm         22 weeks        6.2%  FEMUR LENGTH           3.97 cm         22+5 weeks     16.6%                                                           AVERAGE EGA(BY THIS SCAN):  22+2 weeks                                                 ESTIMATED FETAL WEIGHT:       499  grams, 6.3 %   DOPPLER FLOW STUDIES: UMBILICAL ARTERY RI RATIOS:   0.69, 0.71=42%   ANATOMICAL SURVEY                                                                            COMMENTS CEREBRAL VENTRICLES yes normal   CHOROID PLEXUS yes normal   CEREBELLUM yes normal   CISTERNA MAGNA  CSP Yes   Yes Normal   Normal   NUCHAL REGION yes normal   ORBITS yes normal   NASAL BONE yes normal   NOSE/LIP yes normal   FACIAL PROFILE yes normal   4 CHAMBERED HEART 3VV 3VTV SITUS     Yes  Yes Yes Yes Normal  Normal Normal normal      OUTFLOW TRACTS yes normal   DIAPHRAGM yes normal   STOMACH yes normal   RENAL REGION yes normal   BLADDER yes normal   CORD INSERTION yes normal   3 VESSEL CORD yes normal   SPINE yes normal   ARMS/HANDS yes normal   LEGS/FEET yes normal   GENITALIA yes normal FEMALE              SUSPECTED ABNORMALITIES:  EFW 6.3%  QUALITY OF SCAN: satisfactory   TECHNICIAN COMMENTS:  [name] 85+5 wks,cephalic,cervix 3.8 cm,posterior placenta grade 0,normal ovaries bilat,SVP of fluid 4.6 cm,RI .69,.71=41%,S/D 3.33=38%,EFW 499 g 6.3%,anatomy complete,discussed results w/Dr.Jonavin Seder,pt did not stay for Dr.appt.      A copy of this report including all images has been saved and backed up to a second source for retrieval if needed. All measures and details of the anatomical scan, placentation, fluid volume and pelvic anatomy are contained in that report.  Silver Huguenin 08/23/2017 3:27 PM

## 2017-08-24 ENCOUNTER — Encounter: Payer: Self-pay | Admitting: Advanced Practice Midwife

## 2017-08-24 DIAGNOSIS — O365990 Maternal care for other known or suspected poor fetal growth, unspecified trimester, not applicable or unspecified: Secondary | ICD-10-CM | POA: Insufficient documentation

## 2017-08-30 ENCOUNTER — Other Ambulatory Visit: Payer: Self-pay | Admitting: Advanced Practice Midwife

## 2017-08-30 MED ORDER — PNV PRENATAL PLUS MULTIVITAMIN 27-1 MG PO TABS: 1.0000 | ORAL_TABLET | Freq: Every day | ORAL | 11 refills | Status: DC

## 2017-09-11 ENCOUNTER — Ambulatory Visit (INDEPENDENT_AMBULATORY_CARE_PROVIDER_SITE_OTHER): Payer: Medicaid Other | Admitting: Obstetrics & Gynecology

## 2017-09-11 ENCOUNTER — Encounter: Payer: Self-pay | Admitting: Obstetrics & Gynecology

## 2017-09-11 VITALS — BP 102/72 | HR 113 | Wt 120.5 lb

## 2017-09-11 DIAGNOSIS — Z3A26 26 weeks gestation of pregnancy: Secondary | ICD-10-CM

## 2017-09-11 DIAGNOSIS — Z1389 Encounter for screening for other disorder: Secondary | ICD-10-CM

## 2017-09-11 DIAGNOSIS — Z87898 Personal history of other specified conditions: Secondary | ICD-10-CM

## 2017-09-11 DIAGNOSIS — O365920 Maternal care for other known or suspected poor fetal growth, second trimester, not applicable or unspecified: Secondary | ICD-10-CM

## 2017-09-11 DIAGNOSIS — O0992 Supervision of high risk pregnancy, unspecified, second trimester: Secondary | ICD-10-CM

## 2017-09-11 DIAGNOSIS — F1490 Cocaine use, unspecified, uncomplicated: Secondary | ICD-10-CM

## 2017-09-11 DIAGNOSIS — Z3482 Encounter for supervision of other normal pregnancy, second trimester: Secondary | ICD-10-CM

## 2017-09-11 DIAGNOSIS — O99322 Drug use complicating pregnancy, second trimester: Secondary | ICD-10-CM

## 2017-09-11 DIAGNOSIS — F1991 Other psychoactive substance use, unspecified, in remission: Secondary | ICD-10-CM

## 2017-09-11 DIAGNOSIS — Z331 Pregnant state, incidental: Secondary | ICD-10-CM

## 2017-09-11 NOTE — Addendum Note (Signed)
Addended by: Linton Rump on: 09/11/2017 02:33 PM   Modules accepted: Orders

## 2017-09-11 NOTE — Addendum Note (Signed)
Addended by: Linton Rump on: 09/11/2017 01:31 PM   Modules accepted: Orders

## 2017-09-11 NOTE — Progress Notes (Signed)
Patient ID: Janet Dixon, female   DOB: 09/25/1985, 32 y.o.   MRN: [id number]   [facility] PREGNANCY VISIT Patient name: Janet Dixon MRN [id number]  Date of birth: Mar 06, 1986 Chief Complaint:   Routine Prenatal Visit  History of Present Illness:   Janet Dixon is a 32 y.o. [id number] female at 2w2dwith an Estimated Date of Delivery: 12/16/17 being seen today for ongoing management of a high-risk pregnancy complicated by fetal growth issues, cocaine abuse.  Today she reports no complaints. Contractions: Irregular. Vag. Bleeding: None.  Movement: Present. denies leaking of fluid.  Review of Systems:   Pertinent items are noted in HPI Denies abnormal vaginal discharge w/ itching/odor/irritation, headaches, visual changes, shortness of breath, chest pain, abdominal pain, severe nausea/vomiting, or problems with urination or bowel movements unless otherwise stated above. Pertinent History Reviewed:  Reviewed past medical,surgical, social, obstetrical and family history.  Reviewed problem list, medications and allergies. Physical Assessment:   Vitals:   09/11/17 1207  BP: 102/72  Pulse: (!) 113  Weight: 120 lb 8 oz (54.7 kg)  Body mass index is 22.77 kg/m.           Physical Examination:   General appearance: alert, well appearing, and in no distress  Mental status: alert, oriented to person, place, and time  Skin: warm & dry   Extremities: Edema: None    Cardiovascular: normal heart rate noted  Respiratory: normal respiratory effort, no distress  Abdomen: gravid, soft, non-tender  Pelvic: Cervical exam deferred         Fetal Status:     Movement: Present    Fetal Surveillance Testing today: FHR 160   No results found for this or any previous visit (from the past 24 hour(s)).  Assessment & Plan:  1) High-risk pregnancy G7P6006 at 248w2dith an Estimated Date of Delivery: 12/16/17   2) Cocaine abuse, unstable, pt states she has not used since beginning of the year, but she is  stating she can't give [place] urine this am  3) FGR, stable, recheck sonogram 2 weeks  Meds: No orders of the defined types were placed in this encounter.   Labs/procedures today: none, pt was 2+ hours late for her appt  Treatment Plan:  Sonogram next visit  Reviewed: Preterm labor symptoms and general obstetric precautions including but not limited to vaginal bleeding, contractions, leaking of fluid and fetal movement were reviewed in detail with the patient.  All questions were answered.  Follow-up: No follow-ups on file.  Orders Placed This Encounter  Procedures  . [place] Follow Up  . Pain Management Screening Profile (10S)  . POCT urinalysis dipstick   LuFlorian Buff6/06/2017 12:38 PM

## 2017-09-27 ENCOUNTER — Other Ambulatory Visit: Payer: Medicaid Other

## 2017-09-27 ENCOUNTER — Encounter: Payer: Medicaid Other | Admitting: Advanced Practice Midwife

## 2017-11-27 ENCOUNTER — Other Ambulatory Visit: Payer: Self-pay | Admitting: Obstetrics & Gynecology

## 2017-11-27 DIAGNOSIS — O0992 Supervision of high risk pregnancy, unspecified, second trimester: Secondary | ICD-10-CM

## 2017-11-27 DIAGNOSIS — O365920 Maternal care for other known or suspected poor fetal growth, second trimester, not applicable or unspecified: Secondary | ICD-10-CM

## 2017-11-28 ENCOUNTER — Encounter: Payer: Medicaid Other | Admitting: Women's Health

## 2017-11-28 ENCOUNTER — Other Ambulatory Visit: Payer: Medicaid Other

## 2017-11-29 ENCOUNTER — Ambulatory Visit (INDEPENDENT_AMBULATORY_CARE_PROVIDER_SITE_OTHER): Payer: Medicaid Other | Admitting: Obstetrics and Gynecology

## 2017-11-29 ENCOUNTER — Other Ambulatory Visit: Payer: Self-pay

## 2017-11-29 ENCOUNTER — Other Ambulatory Visit: Payer: Medicaid Other

## 2017-11-29 VITALS — BP 136/73 | HR 117 | Wt 128.0 lb

## 2017-11-29 DIAGNOSIS — Z3A37 37 weeks gestation of pregnancy: Secondary | ICD-10-CM | POA: Diagnosis not present

## 2017-11-29 DIAGNOSIS — O34219 Maternal care for unspecified type scar from previous cesarean delivery: Secondary | ICD-10-CM

## 2017-11-29 DIAGNOSIS — Z331 Pregnant state, incidental: Secondary | ICD-10-CM

## 2017-11-29 DIAGNOSIS — Z1389 Encounter for screening for other disorder: Secondary | ICD-10-CM

## 2017-11-29 DIAGNOSIS — O0993 Supervision of high risk pregnancy, unspecified, third trimester: Secondary | ICD-10-CM

## 2017-11-29 NOTE — Progress Notes (Addendum)
Patient ID: Janet Dixon, female   DOB: 1985/10/17, 32 y.o.   MRN: [id number]    [facility] PREGNANCY VISIT Patient name: Janet Dixon MRN [id number]  Date of birth: [birth year] Chief Complaint:   High Risk Gestation (needs PN2; gbs/gc today)  History of Present Illness:   Janet Dixon is a 32 y.o. G43P6006 female at 46w4dwith an Estimated Date of Delivery: 12/16/17 being seen today for ongoing management of a high-risk pregnancy complicated by Hx of cocaine abuse, not keeping doctors appointments, fetal growth issues. This is baby number 7, and has no plans for birth control. Repeat C-section x6. [place] due to Medicaid not being able to switch [place] and [place] [place] Today she reports no complaints. Contractions: Irregular. Vag. Bleeding: None.  Movement: Present. denies leaking of fluid.  Review of Systems:   Pertinent items are noted in HPI Fam Hx has custody of 2 children , has sent other children to live c various relatives.No formal custody surrender, per patient report. Denies abnormal vaginal discharge w/ itching/odor/irritation, headaches, visual changes, shortness of breath, chest pain, abdominal pain, severe nausea/vomiting, or problems with urination or bowel movements unless otherwise stated above. Pertinent History Reviewed:  Reviewed past medical,surgical, social, obstetrical and family history.  Reviewed problem list, medications and allergies. Physical Assessment:   Vitals:   11/29/17 1553  BP: 136/73  Pulse: (!) 117  Weight: 128 lb (58.1 kg)  Body mass index is 24.19 kg/m.           Physical Examination:   General appearance: alert, well appearing, and in no distress and oriented to person, place, and time  Mental status: alert, oriented to person, place, and time, normal mood, behavior, speech, dress, motor activity, and thought processes, affect  appropriate to mood  Skin: warm & dry   Extremities: Edema: Trace    Cardiovascular: normal heart rate noted  Respiratory: normal respiratory effort, no distress  Abdomen: gravid, soft, non-tender  Pelvic: Cervical exam deferred         Fetal Status: Fetal Heart Rate (bpm): 117 Fundal Height: 35 cm Movement: Present    Fetal Surveillance Testing today: Group B, GCCHL  No results found for this or any previous visit (from the past 24 hour(s)).  Assessment & Plan:  1) High-risk pregnancy G7P6006  Prior CESAREAN X 6, at 353w4dith an Estimated Date of Delivery: 12/16/17   2) Fetal growth development, unable to assess due to patient missing appointments  3) recurrent polysubstance abuse. Weekly UDS .  4 suboptimal prenatal care    Meds: No orders of the defined types were placed in this encounter.   Labs/procedures today: Group B, GCCHL. Given lab sheet for PN2 labs to be drawn hopefully in the morning.  Treatment Plan:  F/u 1 week u/s: anatomy. growth Schedule Repeat cesarean Labor Day weekend    Follow-up: Return in about 1 week (around 12/06/2017) for U/s: anatomy.  Further discussion of contraception plans, weekly UDS  Orders Placed This Encounter  Procedures  . GC/Chlamydia Probe Amp  . Culture, beta strep (group b only)   By signing my name below, I, MaSamul Dadaattest that this documentation has been prepared under the direction and in the presence of FeJonnie KindMD. Electronically Signed: MaFrannie08/21/19. 4:23 PM.  I personally performed the services described in this  documentation, which was SCRIBED in my presence. The recorded information has been reviewed and considered accurate. It has been edited as necessary during review. Jonnie Kind, MD

## 2017-11-30 ENCOUNTER — Ambulatory Visit (INDEPENDENT_AMBULATORY_CARE_PROVIDER_SITE_OTHER): Payer: Self-pay

## 2017-11-30 DIAGNOSIS — O0992 Supervision of high risk pregnancy, unspecified, second trimester: Secondary | ICD-10-CM

## 2017-11-30 DIAGNOSIS — O365920 Maternal care for other known or suspected poor fetal growth, second trimester, not applicable or unspecified: Secondary | ICD-10-CM

## 2017-11-30 NOTE — Progress Notes (Signed)
[name] 76+7 wks,cephalic,BPP,FHR 209 bmp,posterior placenta gr 3,AFI 11.7 cm,normal ovaries bilat,RI .38,.36,.44=2%,EFW 2680 g 12%

## 2017-12-02 LAB — GC/CHLAMYDIA PROBE AMP
CHLAMYDIA, DNA PROBE: POSITIVE — AB
NEISSERIA GONORRHOEAE BY PCR: NEGATIVE

## 2017-12-03 ENCOUNTER — Telehealth: Payer: Self-pay | Admitting: Obstetrics and Gynecology

## 2017-12-03 ENCOUNTER — Other Ambulatory Visit: Payer: Self-pay | Admitting: Obstetrics and Gynecology

## 2017-12-03 LAB — CULTURE, BETA STREP (GROUP B ONLY): STREP GP B CULTURE: NEGATIVE

## 2017-12-03 MED ORDER — AZITHROMYCIN 500 MG PO TABS: 1000.0000 mg | ORAL_TABLET | Freq: Once | ORAL | 1 refills | Status: AC

## 2017-12-03 NOTE — Telephone Encounter (Signed)
Left message on pt's personal phone to pick up medication at [place].

## 2017-12-04 ENCOUNTER — Telehealth: Payer: Self-pay | Admitting: Obstetrics and Gynecology

## 2017-12-04 ENCOUNTER — Encounter: Payer: Medicaid Other | Admitting: Obstetrics and Gynecology

## 2017-12-04 NOTE — Telephone Encounter (Signed)
Pateint called stating that she was not able to come to her appointment today because she has a tooth abscess and was still in bed. Pt would like to know if Dr. Glo Herring could give her a call and schedule the induction date over the phone instead of her coming in. Please contact pt

## 2017-12-04 NOTE — Telephone Encounter (Signed)
Patient informed I spoke with Dr Glo Herring in regards to scheduling her c/s.  Informed she needed to be seen at least 3 days before her surgery to get it scheduled. Patient stated she could come in the afternoon.  Asked when it was scheduled and informed it was not scheduled but tomorrow at her appt it would be scheduled. Patient stated she would come between 2:30 and 3.  I informed patient  I had her down to come at 2:45. Pt chuckled and said "ok".

## 2017-12-05 ENCOUNTER — Encounter: Payer: Medicaid Other | Admitting: Obstetrics and Gynecology

## 2017-12-09 ENCOUNTER — Encounter (HOSPITAL_COMMUNITY)
Admission: AD | Disposition: A | Discharge status: Home / Self Care | Payer: Self-pay | Source: Home / Self Care | Attending: Obstetrics & Gynecology

## 2017-12-09 ENCOUNTER — Encounter (HOSPITAL_COMMUNITY): Payer: Self-pay

## 2017-12-09 ENCOUNTER — Inpatient Hospital Stay (HOSPITAL_COMMUNITY): Payer: Medicaid Other | Admitting: Anesthesiology

## 2017-12-09 ENCOUNTER — Inpatient Hospital Stay (HOSPITAL_COMMUNITY)
Admission: AD | Admit: 2017-12-09 | Discharge: 2017-12-12 | DRG: 787 | Disposition: A | Discharge status: Home / Self Care | Payer: Medicaid Other | Attending: Obstetrics & Gynecology | Admitting: Obstetrics & Gynecology

## 2017-12-09 DIAGNOSIS — Z3A39 39 weeks gestation of pregnancy: Secondary | ICD-10-CM | POA: Diagnosis not present

## 2017-12-09 DIAGNOSIS — O99334 Smoking (tobacco) complicating childbirth: Secondary | ICD-10-CM | POA: Diagnosis present

## 2017-12-09 DIAGNOSIS — G40909 Epilepsy, unspecified, not intractable, without status epilepticus: Secondary | ICD-10-CM

## 2017-12-09 DIAGNOSIS — O99324 Drug use complicating childbirth: Secondary | ICD-10-CM | POA: Diagnosis present

## 2017-12-09 DIAGNOSIS — O34211 Maternal care for low transverse scar from previous cesarean delivery: Secondary | ICD-10-CM | POA: Diagnosis not present

## 2017-12-09 DIAGNOSIS — F17210 Nicotine dependence, cigarettes, uncomplicated: Secondary | ICD-10-CM | POA: Diagnosis present

## 2017-12-09 DIAGNOSIS — O99320 Drug use complicating pregnancy, unspecified trimester: Secondary | ICD-10-CM | POA: Diagnosis present

## 2017-12-09 DIAGNOSIS — O0990 Supervision of high risk pregnancy, unspecified, unspecified trimester: Secondary | ICD-10-CM

## 2017-12-09 DIAGNOSIS — Z98891 History of uterine scar from previous surgery: Secondary | ICD-10-CM

## 2017-12-09 DIAGNOSIS — O34219 Maternal care for unspecified type scar from previous cesarean delivery: Secondary | ICD-10-CM | POA: Diagnosis not present

## 2017-12-09 DIAGNOSIS — O365990 Maternal care for other known or suspected poor fetal growth, unspecified trimester, not applicable or unspecified: Secondary | ICD-10-CM

## 2017-12-09 DIAGNOSIS — J45909 Unspecified asthma, uncomplicated: Secondary | ICD-10-CM | POA: Diagnosis present

## 2017-12-09 DIAGNOSIS — O9952 Diseases of the respiratory system complicating childbirth: Secondary | ICD-10-CM | POA: Diagnosis present

## 2017-12-09 DIAGNOSIS — F1410 Cocaine abuse, uncomplicated: Secondary | ICD-10-CM | POA: Diagnosis present

## 2017-12-09 DIAGNOSIS — R748 Abnormal levels of other serum enzymes: Secondary | ICD-10-CM | POA: Diagnosis present

## 2017-12-09 LAB — TYPE AND SCREEN
ABO/RH(D): O POS
ANTIBODY SCREEN: NEGATIVE

## 2017-12-09 LAB — CBC
HCT: 38.5 % (ref 36.0–46.0)
Hemoglobin: 13.1 g/dL (ref 12.0–15.0)
MCH: 33.3 pg (ref 26.0–34.0)
MCHC: 34 g/dL (ref 30.0–36.0)
MCV: 98 fL (ref 78.0–100.0)
PLATELETS: 247 10*3/uL (ref 150–400)
RBC: 3.93 MIL/uL (ref 3.87–5.11)
RDW: 13.1 % (ref 11.5–15.5)
WBC: 9.5 10*3/uL (ref 4.0–10.5)

## 2017-12-09 LAB — URINALYSIS, ROUTINE W REFLEX MICROSCOPIC
BILIRUBIN URINE: NEGATIVE
Glucose, UA: NEGATIVE mg/dL
KETONES UR: NEGATIVE mg/dL
NITRITE: NEGATIVE
Protein, ur: NEGATIVE mg/dL
Specific Gravity, Urine: 1.005 (ref 1.005–1.030)
pH: 8 (ref 5.0–8.0)

## 2017-12-09 LAB — RAPID URINE DRUG SCREEN, HOSP PERFORMED
Amphetamines: NOT DETECTED
Barbiturates: NOT DETECTED
Benzodiazepines: NOT DETECTED
Cocaine: POSITIVE — AB
OPIATES: NOT DETECTED
Tetrahydrocannabinol: NOT DETECTED

## 2017-12-09 LAB — HEPATITIS B SURFACE ANTIGEN: HEP B S AG: NEGATIVE

## 2017-12-09 SURGERY — Surgical Case
Anesthesia: Spinal

## 2017-12-09 MED ORDER — BUPIVACAINE IN DEXTROSE 0.75-8.25 % IT SOLN
INTRATHECAL | Status: DC | PRN
  Administered 2017-12-09: 1.6 mL via INTRATHECAL

## 2017-12-09 MED ORDER — MENTHOL 3 MG MT LOZG: 1.0000 | LOZENGE | OROMUCOSAL | Status: DC | PRN

## 2017-12-09 MED ORDER — SIMETHICONE 80 MG PO CHEW
80.0000 mg | CHEWABLE_TABLET | Freq: Three times a day (TID) | ORAL | Status: DC
  Administered 2017-12-09 – 2017-12-12 (×7): 80 mg via ORAL
  Filled 2017-12-09 (×8): qty 1

## 2017-12-09 MED ORDER — OXYTOCIN 10 UNIT/ML IJ SOLN
INTRAVENOUS | Status: DC | PRN
  Administered 2017-12-09: 40 [IU] via INTRAVENOUS

## 2017-12-09 MED ORDER — NIFEDIPINE 10 MG PO CAPS
10.0000 mg | ORAL_CAPSULE | Freq: Once | ORAL | Status: AC
  Administered 2017-12-09: 10 mg via ORAL
  Filled 2017-12-09: qty 1

## 2017-12-09 MED ORDER — CEFAZOLIN SODIUM-DEXTROSE 2-4 GM/100ML-% IV SOLN: 2.0000 g | Freq: Once | INTRAVENOUS | Status: DC

## 2017-12-09 MED ORDER — DEXAMETHASONE SODIUM PHOSPHATE 10 MG/ML IJ SOLN
INTRAMUSCULAR | Status: AC
  Filled 2017-12-09: qty 1

## 2017-12-09 MED ORDER — ALBUTEROL SULFATE (2.5 MG/3ML) 0.083% IN NEBU: 3.0000 mL | INHALATION_SOLUTION | Freq: Four times a day (QID) | RESPIRATORY_TRACT | Status: DC | PRN

## 2017-12-09 MED ORDER — MORPHINE SULFATE (PF) 0.5 MG/ML IJ SOLN
INTRAMUSCULAR | Status: DC | PRN
  Administered 2017-12-09: .15 mg via EPIDURAL

## 2017-12-09 MED ORDER — PHENYLEPHRINE 8 MG IN D5W 100 ML (0.08MG/ML) PREMIX OPTIME
INJECTION | INTRAVENOUS | Status: AC
  Filled 2017-12-09: qty 100

## 2017-12-09 MED ORDER — PHENYLEPHRINE HCL 10 MG/ML IJ SOLN
INTRAMUSCULAR | Status: DC | PRN
  Administered 2017-12-09 (×2): 80 ug via INTRAVENOUS

## 2017-12-09 MED ORDER — FENTANYL CITRATE (PF) 100 MCG/2ML IJ SOLN
INTRAMUSCULAR | Status: AC
  Filled 2017-12-09: qty 2

## 2017-12-09 MED ORDER — OXYTOCIN 40 UNITS IN LACTATED RINGERS INFUSION - SIMPLE MED: 2.5000 [IU]/h | INTRAVENOUS | Status: AC

## 2017-12-09 MED ORDER — SODIUM CHLORIDE 0.9 % IR SOLN
Status: DC | PRN
  Administered 2017-12-09: 1000 mL

## 2017-12-09 MED ORDER — SENNOSIDES-DOCUSATE SODIUM 8.6-50 MG PO TABS
2.0000 | ORAL_TABLET | ORAL | Status: DC
  Administered 2017-12-10 – 2017-12-11 (×3): 2 via ORAL
  Filled 2017-12-09 (×4): qty 2

## 2017-12-09 MED ORDER — ONDANSETRON HCL 4 MG/2ML IJ SOLN
INTRAMUSCULAR | Status: DC | PRN
  Administered 2017-12-09: 4 mg via INTRAVENOUS

## 2017-12-09 MED ORDER — DIPHENHYDRAMINE HCL 25 MG PO CAPS: 25.0000 mg | ORAL_CAPSULE | Freq: Four times a day (QID) | ORAL | Status: DC | PRN

## 2017-12-09 MED ORDER — COCONUT OIL OIL: 1.0000 "application " | TOPICAL_OIL | Status: DC | PRN

## 2017-12-09 MED ORDER — TETANUS-DIPHTH-ACELL PERTUSSIS 5-2.5-18.5 LF-MCG/0.5 IM SUSP: 0.5000 mL | Freq: Once | INTRAMUSCULAR | Status: DC

## 2017-12-09 MED ORDER — OXYTOCIN 10 UNIT/ML IJ SOLN
INTRAMUSCULAR | Status: AC
  Filled 2017-12-09: qty 4

## 2017-12-09 MED ORDER — SIMETHICONE 80 MG PO CHEW: 80.0000 mg | CHEWABLE_TABLET | ORAL | Status: DC | PRN

## 2017-12-09 MED ORDER — FAMOTIDINE IN NACL 20-0.9 MG/50ML-% IV SOLN
20.0000 mg | Freq: Once | INTRAVENOUS | Status: AC
  Administered 2017-12-09: 20 mg via INTRAVENOUS
  Filled 2017-12-09: qty 50

## 2017-12-09 MED ORDER — ACETAMINOPHEN 325 MG PO TABS
650.0000 mg | ORAL_TABLET | ORAL | Status: DC | PRN
  Administered 2017-12-09 – 2017-12-10 (×3): 650 mg via ORAL
  Filled 2017-12-09 (×3): qty 2

## 2017-12-09 MED ORDER — LACTATED RINGERS IV BOLUS
1000.0000 mL | Freq: Once | INTRAVENOUS | Status: AC
  Administered 2017-12-09: 1000 mL via INTRAVENOUS

## 2017-12-09 MED ORDER — ZOLPIDEM TARTRATE 5 MG PO TABS: 5.0000 mg | ORAL_TABLET | Freq: Every evening | ORAL | Status: DC | PRN

## 2017-12-09 MED ORDER — PRENATAL MULTIVITAMIN CH
1.0000 | ORAL_TABLET | Freq: Every day | ORAL | Status: DC
  Administered 2017-12-10 – 2017-12-12 (×3): 1 via ORAL
  Filled 2017-12-09 (×3): qty 1

## 2017-12-09 MED ORDER — LACTATED RINGERS IV SOLN
INTRAVENOUS | Status: DC | PRN
  Administered 2017-12-09: 12:00:00 via INTRAVENOUS

## 2017-12-09 MED ORDER — ONDANSETRON HCL 4 MG/2ML IJ SOLN
INTRAMUSCULAR | Status: AC
  Filled 2017-12-09: qty 2

## 2017-12-09 MED ORDER — LACTATED RINGERS IV SOLN
INTRAVENOUS | Status: DC | PRN
  Administered 2017-12-09: 13:00:00 via INTRAVENOUS

## 2017-12-09 MED ORDER — FENTANYL CITRATE (PF) 100 MCG/2ML IJ SOLN
INTRAMUSCULAR | Status: DC | PRN
  Administered 2017-12-09: 15 ug via INTRATHECAL

## 2017-12-09 MED ORDER — ENOXAPARIN SODIUM 40 MG/0.4ML ~~LOC~~ SOLN
40.0000 mg | SUBCUTANEOUS | Status: DC
  Administered 2017-12-10 – 2017-12-12 (×3): 40 mg via SUBCUTANEOUS
  Filled 2017-12-09 (×3): qty 0.4

## 2017-12-09 MED ORDER — SOD CITRATE-CITRIC ACID 500-334 MG/5ML PO SOLN
30.0000 mL | Freq: Once | ORAL | Status: AC
  Administered 2017-12-09: 30 mL via ORAL
  Filled 2017-12-09: qty 15

## 2017-12-09 MED ORDER — LACTATED RINGERS IV SOLN
INTRAVENOUS | Status: DC
  Administered 2017-12-10: 02:00:00 via INTRAVENOUS

## 2017-12-09 MED ORDER — MORPHINE SULFATE (PF) 0.5 MG/ML IJ SOLN
INTRAMUSCULAR | Status: AC
  Filled 2017-12-09: qty 10

## 2017-12-09 MED ORDER — WITCH HAZEL-GLYCERIN EX PADS: 1.0000 "application " | MEDICATED_PAD | CUTANEOUS | Status: DC | PRN

## 2017-12-09 MED ORDER — PHENYLEPHRINE 8 MG IN D5W 100 ML (0.08MG/ML) PREMIX OPTIME
INJECTION | INTRAVENOUS | Status: DC | PRN
  Administered 2017-12-09: 60 ug/min via INTRAVENOUS

## 2017-12-09 MED ORDER — DEXAMETHASONE SODIUM PHOSPHATE 4 MG/ML IJ SOLN
INTRAMUSCULAR | Status: DC | PRN
  Administered 2017-12-09: 4 mg via INTRAVENOUS

## 2017-12-09 MED ORDER — CEFAZOLIN SODIUM-DEXTROSE 2-4 GM/100ML-% IV SOLN
2.0000 g | INTRAVENOUS | Status: AC
  Administered 2017-12-09: 2 g via INTRAVENOUS
  Filled 2017-12-09: qty 100

## 2017-12-09 MED ORDER — OXYCODONE HCL 5 MG PO TABS
5.0000 mg | ORAL_TABLET | Freq: Once | ORAL | Status: AC
  Administered 2017-12-09: 5 mg via ORAL
  Filled 2017-12-09: qty 1

## 2017-12-09 MED ORDER — SIMETHICONE 80 MG PO CHEW
80.0000 mg | CHEWABLE_TABLET | ORAL | Status: DC
  Administered 2017-12-10 (×2): 80 mg via ORAL
  Filled 2017-12-09 (×3): qty 1

## 2017-12-09 MED ORDER — IBUPROFEN 600 MG PO TABS: 600.0000 mg | ORAL_TABLET | Freq: Four times a day (QID) | ORAL | Status: DC

## 2017-12-09 MED ORDER — PHENYLEPHRINE 40 MCG/ML (10ML) SYRINGE FOR IV PUSH (FOR BLOOD PRESSURE SUPPORT)
PREFILLED_SYRINGE | INTRAVENOUS | Status: AC
  Filled 2017-12-09: qty 10

## 2017-12-09 MED ORDER — LACTATED RINGERS IV SOLN
INTRAVENOUS | Status: DC
  Administered 2017-12-09: 11:00:00 via INTRAVENOUS

## 2017-12-09 MED ORDER — DIBUCAINE 1 % RE OINT: 1.0000 "application " | TOPICAL_OINTMENT | RECTAL | Status: DC | PRN

## 2017-12-09 SURGICAL SUPPLY — 39 items
APL SKNCLS STERI-STRIP NONHPOA (GAUZE/BANDAGES/DRESSINGS) ×1
BENZOIN TINCTURE PRP APPL 2/3 (GAUZE/BANDAGES/DRESSINGS) ×2 IMPLANT
CHLORAPREP W/TINT 26ML (MISCELLANEOUS) ×3 IMPLANT
CLAMP CORD UMBIL (MISCELLANEOUS) IMPLANT
CLOSURE STERI STRIP 1/2 X4 (GAUZE/BANDAGES/DRESSINGS) ×2 IMPLANT
CLOSURE WOUND 1/2 X4 (GAUZE/BANDAGES/DRESSINGS)
CLOTH BEACON ORANGE TIMEOUT ST (SAFETY) ×3 IMPLANT
DRSG OPSITE POSTOP 4X10 (GAUZE/BANDAGES/DRESSINGS) ×3 IMPLANT
ELECT REM PT RETURN 9FT ADLT (ELECTROSURGICAL) ×3
ELECTRODE REM PT RTRN 9FT ADLT (ELECTROSURGICAL) ×1 IMPLANT
EXTRACTOR VACUUM KIWI (MISCELLANEOUS) IMPLANT
GLOVE BIOGEL PI IND STRL 7.0 (GLOVE) ×1 IMPLANT
GLOVE BIOGEL PI IND STRL 9 (GLOVE) ×1 IMPLANT
GLOVE BIOGEL PI INDICATOR 7.0 (GLOVE) ×2
GLOVE BIOGEL PI INDICATOR 9 (GLOVE) ×2
GLOVE SS PI 9.0 STRL (GLOVE) ×3 IMPLANT
GOWN STRL REUS W/TWL 2XL LVL3 (GOWN DISPOSABLE) ×3 IMPLANT
GOWN STRL REUS W/TWL LRG LVL3 (GOWN DISPOSABLE) ×3 IMPLANT
NDL HYPO 25X5/8 SAFETYGLIDE (NEEDLE) IMPLANT
NEEDLE HYPO 25X5/8 SAFETYGLIDE (NEEDLE) IMPLANT
NS IRRIG 1000ML POUR BTL (IV SOLUTION) ×3 IMPLANT
PACK C SECTION WH (CUSTOM PROCEDURE TRAY) ×3 IMPLANT
PAD OB MATERNITY 4.3X12.25 (PERSONAL CARE ITEMS) ×3 IMPLANT
PENCIL SMOKE EVAC W/HOLSTER (ELECTROSURGICAL) ×3 IMPLANT
RTRCTR C-SECT PINK 25CM LRG (MISCELLANEOUS) IMPLANT
RTRCTR C-SECT PINK 34CM XLRG (MISCELLANEOUS) IMPLANT
STRIP CLOSURE SKIN 1/2X4 (GAUZE/BANDAGES/DRESSINGS) IMPLANT
SUT MNCRL 0 VIOLET CTX 36 (SUTURE) ×2 IMPLANT
SUT MONOCRYL 0 CTX 36 (SUTURE) ×4
SUT PLAIN 2 0 (SUTURE) ×3
SUT PLAIN ABS 2-0 CT1 27XMFL (SUTURE) IMPLANT
SUT VIC AB 0 CT1 27 (SUTURE) ×3
SUT VIC AB 0 CT1 27XBRD ANBCTR (SUTURE) ×1 IMPLANT
SUT VIC AB 2-0 CT1 27 (SUTURE) ×3
SUT VIC AB 2-0 CT1 TAPERPNT 27 (SUTURE) ×1 IMPLANT
SUT VIC AB 4-0 KS 27 (SUTURE) ×3 IMPLANT
SYR BULB IRRIGATION 50ML (SYRINGE) IMPLANT
TOWEL OR 17X24 6PK STRL BLUE (TOWEL DISPOSABLE) ×3 IMPLANT
TRAY FOLEY W/BAG SLVR 14FR LF (SET/KITS/TRAYS/PACK) ×3 IMPLANT

## 2017-12-09 NOTE — H&P (Signed)
MAU Provider Note  Signed  Date of Service:  12/09/2017 9:35 AM          Signed         Show:Clear all [x] Manual[x] Template[] Copied  Added by: [x] Jonnie Kind, MD  [] Hover for details  History   CSN: [id number]  Arrival date and time: 12/09/17 0272   None        Chief Complaint  Patient presents with  . Contractions   HPI this G7P6006 at [redacted]w[redacted]d prior cesarean x 6 is seen in MAU after pregnancy notable for recurrent + UDS, missed appts, including this week's preop appts, though every effort to accomodate pt was made. Pt last ate at 3:30 am, presents with contractions q 8 mins, FHR cat I, and cervical exam 2 cm , vertex , intact by RN eval. Pt does not desire tubal.  Pt will be set up for repeat cesarean as soon as labs, IV , and OR arrangements completed.  Pt was dx'd for Chlamydia again, had likely not been treated, and had rx called in this week.        Past Medical History:  Diagnosis Date  . Asthma   . History of chlamydia   . History of gonorrhea   . History of trichomoniasis   . [place]          Past Surgical History:  Procedure Laterality Date  . CESAREAN SECTION    . CESAREAN SECTION N/A 12/02/2014   Procedure: REPEAT CESAREAN SECTION;  Surgeon: JJonnie Kind MD;  [facility];  Service: Obstetrics;  Laterality: N/A;         Family History  Problem Relation Age of Onset  . Hypertension Mother   . Asthma Brother   . Asthma Daughter   . Eczema Daughter   . Asthma Son   . Eczema Son   . Aneurysm Maternal Grandmother   . Heart disease Maternal Grandmother   . Cancer Maternal Grandmother   . Asthma Daughter   . Eczema Daughter   . Asthma Son   . Eczema Son   . Eczema Son   . Cancer Maternal Grandfather        colon  . Eczema Maternal Aunt     Social History        Tobacco Use  . Smoking status: Current Every Day Smoker    Packs/day: 0.50    Years: 13.00    Pack  years: 6.50    Types: Cigarettes  . Smokeless tobacco: Never Used  . Tobacco comment: 3 per day  Substance Use Topics  . Alcohol use: No    Comment: none x 1 month ago  . Drug use: Yes    Types: Marijuana, Cocaine    Comment: states its been almost a year    Allergies:       Allergies  Allergen Reactions  . Macrobid [Nitrofurantoin Macrocrystal] Shortness Of Breath, Nausea And Vomiting and Other (See Comments)    dizziness  . Diphenhydramine Hcl Other (See Comments)    Unknown-patient states that she does take this medication  . Ibuprofen Other (See Comments)    Patient states that syncope has occurred after taking this medication  . Flexeril [Cyclobenzaprine Hcl] Rash           Medications Prior to Admission  Medication Sig Dispense Refill Last Dose  . albuterol (PROVENTIL HFA;VENTOLIN HFA) 108 (90 Base) MCG/ACT inhaler Inhale 2 puffs into the lungs every 6 (six) hours as needed for wheezing  or shortness of breath. 1 Inhaler 1 Taking  . Doxylamine-Pyridoxine 10-10 MG TBEC 2 PO qhs; may take 1po in am and 1po in afternoon prn nausea (Patient not taking: Reported on 11/29/2017) 120 tablet 3 Not Taking  . flintstones complete (FLINTSTONES) 60 MG chewable tablet Take 2 daily (Patient not taking: Reported on 11/29/2017)   Not Taking  . Prenatal Vit-Fe Fumarate-FA (PNV PRENATAL PLUS MULTIVITAMIN) 27-1 MG TABS Take 1 tablet by mouth daily. (Patient not taking: Reported on 11/29/2017) 30 tablet 11 Not Taking    Review of Systems Physical Exam   Blood pressure 124/79, pulse 96, temperature 98.5 F (36.9 C), temperature source Oral, resp. rate 18, weight 60.3 kg, last menstrual period 03/11/2017.  Physical Exam  Constitutional: She is oriented to person, place, and time. She appears well-developed and well-nourished.  Eyes: Pupils are equal, round, and reactive to light.  Neck: Normal range of motion.  Cardiovascular: Normal rate.  Respiratory: Effort normal.    GI:  Gravid uterus, term, FHR cat I, contractions q 8 mins.  Musculoskeletal: Normal range of motion.  Neurological: She is alert and oriented to person, place, and time.  Skin: Skin is warm and dry.  Psychiatric: She has a normal mood and affect. Her behavior is normal. Thought content normal.    MAU Course  Procedures  Will start IV, prep for c/s   MDM Review of records, exam , planning for cesarean.  Assessment and Plan  Repeat cesarean section . Labs ordered.  OR notified  Jonnie Kind 12/09/2017, 9:35 AM

## 2017-12-09 NOTE — Anesthesia Procedure Notes (Signed)
Spinal  Patient location during procedure: OR Start time: 12/09/2017 12:00 PM End time: 12/09/2017 12:05 PM Staffing Anesthesiologist: Freddrick March, MD Performed: anesthesiologist  Preanesthetic Checklist Completed: patient identified, surgical consent, pre-op evaluation, timeout performed, IV checked, risks and benefits discussed and monitors and equipment checked Spinal Block Patient position: sitting Prep: site prepped and draped and DuraPrep Patient monitoring: cardiac monitor, continuous pulse ox and blood pressure Approach: midline Location: L3-4 Injection technique: single-shot Needle Needle type: Pencan  Needle gauge: 24 G Needle length: 9 cm Assessment Sensory level: T6 Additional Notes Functioning IV was confirmed and monitors were applied. Sterile prep and drape, including hand hygiene and sterile gloves were used. The patient was positioned and the spine was prepped. The skin was anesthetized with lidocaine.  Free flow of clear CSF was obtained prior to injecting local anesthetic into the CSF.  The spinal needle aspirated freely following injection.  The needle was carefully withdrawn.  The patient tolerated the procedure well.

## 2017-12-09 NOTE — Op Note (Signed)
Please see the brief operative note for surgical details

## 2017-12-09 NOTE — MAU Provider Note (Signed)
History     CSN: [id number]  Arrival date and time: 12/09/17 8177   None     Chief Complaint  Patient presents with  . Contractions   HPI this G7P6006 at [redacted]w[redacted]d prior cesarean x 6 is seen in MAU after pregnancy notable for recurrent + UDS, missed appts, including this week's preop appts, though every effort to accomodate pt was made. Pt last ate at 3:30 am, presents with contractions q 8 mins, FHR cat I, and cervical exam 2 cm , vertex , intact by RN eval. Pt does not desire tubal.  Pt will be set up for repeat cesarean as soon as labs, IV , and OR arrangements completed.  Pt was dx'd for Chlamydia again, had likely not been treated, and had rx called in this week.    Past Medical History:  Diagnosis Date  . Asthma   . History of chlamydia   . History of gonorrhea   . History of trichomoniasis   . [place]     Past Surgical History:  Procedure Laterality Date  . CESAREAN SECTION    . CESAREAN SECTION N/A 12/02/2014   Procedure: REPEAT CESAREAN SECTION;  Surgeon: JJonnie Kind MD;  [facility];  Service: Obstetrics;  Laterality: N/A;    Family History  Problem Relation Age of Onset  . Hypertension Mother   . Asthma Brother   . Asthma Daughter   . Eczema Daughter   . Asthma Son   . Eczema Son   . Aneurysm Maternal Grandmother   . Heart disease Maternal Grandmother   . Cancer Maternal Grandmother   . Asthma Daughter   . Eczema Daughter   . Asthma Son   . Eczema Son   . Eczema Son   . Cancer Maternal Grandfather        colon  . Eczema Maternal Aunt     Social History   Tobacco Use  . Smoking status: Current Every Day Smoker    Packs/day: 0.50    Years: 13.00    Pack years: 6.50    Types: Cigarettes  . Smokeless tobacco: Never Used  . Tobacco comment: 3 per day  Substance Use Topics  . Alcohol use: No    Comment: none x 1 month ago  . Drug use: Yes    Types: Marijuana, Cocaine    Comment: states its been almost a year    Allergies:   Allergies  Allergen Reactions  . Macrobid [Nitrofurantoin Macrocrystal] Shortness Of Breath, Nausea And Vomiting and Other (See Comments)    dizziness  . Diphenhydramine Hcl Other (See Comments)    Unknown-patient states that she does take this medication  . Ibuprofen Other (See Comments)    Patient states that syncope has occurred after taking this medication  . Flexeril [Cyclobenzaprine Hcl] Rash    Medications Prior to Admission  Medication Sig Dispense Refill Last Dose  . albuterol (PROVENTIL HFA;VENTOLIN HFA) 108 (90 Base) MCG/ACT inhaler Inhale 2 puffs into the lungs every 6 (six) hours as needed for wheezing or shortness of breath. 1 Inhaler 1 Taking  . Doxylamine-Pyridoxine 10-10 MG TBEC 2 PO qhs; may take 1po in am and 1po in afternoon prn nausea (Patient not taking: Reported on 11/29/2017) 120 tablet 3 Not Taking  . flintstones complete (FLINTSTONES) 60 MG chewable tablet Take 2 daily (Patient not taking: Reported on 11/29/2017)   Not Taking  . Prenatal Vit-Fe Fumarate-FA (PNV PRENATAL PLUS MULTIVITAMIN) 27-1 MG TABS Take 1 tablet by mouth  daily. (Patient not taking: Reported on 11/29/2017) 30 tablet 11 Not Taking    Review of Systems Physical Exam   Blood pressure 124/79, pulse 96, temperature 98.5 F (36.9 C), temperature source Oral, resp. rate 18, weight 60.3 kg, last menstrual period 03/11/2017.  Physical Exam  Constitutional: She is oriented to person, place, and time. She appears well-developed and well-nourished.  Eyes: Pupils are equal, round, and reactive to light.  Neck: Normal range of motion.  Cardiovascular: Normal rate.  Respiratory: Effort normal.  GI:  Gravid uterus, term, FHR cat I, contractions q 8 mins.  Musculoskeletal: Normal range of motion.  Neurological: She is alert and oriented to person, place, and time.  Skin: Skin is warm and dry.  Psychiatric: She has a normal mood and affect. Her behavior is normal. Thought content normal.    MAU Course   Procedures  Will start IV, prep for c/s   MDM Review of records, exam , planning for cesarean.  Assessment and Plan  Repeat cesarean section . Labs ordered.  OR notified  Jonnie Kind 12/09/2017, 9:35 AM

## 2017-12-09 NOTE — Anesthesia Postprocedure Evaluation (Signed)
Anesthesia Post Note  Patient: Janet Dixon  Procedure(s) Performed: CESAREAN SECTION (N/A )     Patient location during evaluation: PACU Anesthesia Type: Spinal Level of consciousness: oriented and awake and alert Pain management: pain level controlled Vital Signs Assessment: post-procedure vital signs reviewed and stable Respiratory status: spontaneous breathing, respiratory function stable and patient connected to nasal cannula oxygen Cardiovascular status: blood pressure returned to baseline and stable Postop Assessment: no headache, no backache and no apparent nausea or vomiting Anesthetic complications: no    Last Vitals:  Vitals:   12/09/17 1421 12/09/17 1549  BP: 122/83 (!) 129/58  Pulse: 66 80  Resp: 18 18  Temp: (!) 36.3 C 36.8 C  SpO2: 100% 100%    Last Pain:  Vitals:   12/09/17 1553  TempSrc:   PainSc: 7    Pain Goal:                 Jaxden Blyden L Selenia Mihok

## 2017-12-09 NOTE — Anesthesia Preprocedure Evaluation (Signed)
Anesthesia Evaluation  Patient identified by MRN, date of birth, ID band Patient awake    Reviewed: Allergy & Precautions, NPO status , Patient's Chart, lab work & pertinent test results  Airway Mallampati: II  TM Distance: >3 FB Neck ROM: Full    Dental no notable dental hx. (+) Teeth Intact   Pulmonary asthma , Current Smoker,    Pulmonary exam normal breath sounds clear to auscultation       Cardiovascular negative cardio ROS Normal cardiovascular exam Rhythm:Regular Rate:Normal     Neuro/Psych negative neurological ROS  negative psych ROS   GI/Hepatic negative GI ROS, (+)     substance abuse (opioids)  cocaine use and marijuana use,   Endo/Other  negative endocrine ROS  Renal/GU negative Renal ROS  negative genitourinary   Musculoskeletal negative musculoskeletal ROS (+)   Abdominal   Peds  Hematology negative hematology ROS (+)   Anesthesia Other Findings   Reproductive/Obstetrics (+) Pregnancy Repeat C/S                             Anesthesia Physical Anesthesia Plan  ASA: II  Anesthesia Plan: Spinal   Post-op Pain Management:    Induction: Intravenous  PONV Risk Score and Plan: 1 and Treatment may vary due to age or medical condition  Airway Management Planned: Natural Airway  Additional Equipment:   Intra-op Plan:   Post-operative Plan:   Informed Consent: I have reviewed the patients History and Physical, chart, labs and discussed the procedure including the risks, benefits and alternatives for the proposed anesthesia with the patient or authorized representative who has indicated his/her understanding and acceptance.   Dental advisory given  Plan Discussed with: CRNA  Anesthesia Plan Comments:         Anesthesia Quick Evaluation

## 2017-12-09 NOTE — Progress Notes (Signed)
Dr Lanetta Inch notified that pt rating rate at 7.  Orders received

## 2017-12-09 NOTE — Progress Notes (Signed)
Patient covering baby's head with blanket, RN moved it back to be able to see baby's face, but patient continues to replace it.  Attempting to hide the fact that she is breastfeeding despite having been educated. Janet Dixon, [place] 12/09/2017 1:50 PM

## 2017-12-09 NOTE — Progress Notes (Signed)
Patient notified of positive UDS- cocaine. Advised not to breastfeed baby until cleared by pediatrician. RN observed patient after this discussion attempting to latch baby to breast. Delora Fuel RN notified, provided mother with bottle for formula feeding.   Patient also will not allow RN to take temperature. RN attempted 3 times, but patient keeps removing probe from under tongue and handing back to RN. RN placed warm blankets around patient's head and on body.  Patient requesting foods; RN offered [employer] but patient refused.  Concepcion Kirkpatrick [place], [place] 12/09/2017 1:40 PM

## 2017-12-09 NOTE — Progress Notes (Signed)
Mother tested positive for cocaine (throughout pregnancy and on admission) and advised not to breastfeed. A bottle was given to mother, along with formula education including amount to feed, expiration of formula and when to feed. PACU RN witnessed mother attempting to put baby to the breast anyway. MD made aware.

## 2017-12-09 NOTE — Transfer of Care (Signed)
Immediate Anesthesia Transfer of Care Note  Patient: Janet Dixon  Procedure(s) Performed: CESAREAN SECTION (N/A )  Patient Location: PACU  Anesthesia Type:Spinal  Level of Consciousness: awake, alert  and oriented  Airway & Oxygen Therapy: Patient Spontanous Breathing  Post-op Assessment: Report given to RN and Post -op Vital signs reviewed and stable  Post vital signs: Reviewed and stable  Last Vitals:  Vitals Value Taken Time  BP 101/57 12/09/2017  1:13 PM  Temp    Pulse 80 12/09/2017  1:13 PM  Resp 9 12/09/2017  1:13 PM  SpO2 100 % 12/09/2017  1:13 PM    Last Pain:  Vitals:   12/09/17 0920  TempSrc: Oral  PainSc:          Complications: No apparent anesthesia complications

## 2017-12-09 NOTE — MAU Note (Signed)
Contractions have been ongoing for a couple days but got more intense through the night and into this AM  No bleeding, no LOF, +FM

## 2017-12-09 NOTE — Op Note (Signed)
12/09/2017  1:32 PM  PATIENT:  Janet Dixon  32 y.o. female  PRE-OPERATIVE DIAGNOSIS:  Repeat Cesarean section, prior cesarean x 6, early labor at 39 weeks  POST-OPERATIVE DIAGNOSIS:  Repeat Cesarean section, prior cesarean x 6, latent phase labor at 39 weeks  PROCEDURE:  Procedure(s): CESAREAN SECTION (N/A)  SURGEON:  Surgeon(s) and Role:    * Jonnie Kind, MD - Primary  PHYSICIAN ASSISTANT:   ASSISTANTS: none   ANESTHESIA:   spinal  EBL:  559 mL   BLOOD ADMINISTERED:none  DRAINS: Urinary Catheter (Foley)   LOCAL MEDICATIONS USED:  NONE  SPECIMEN:  Source of Specimen:  Placenta to labor and delivery  DISPOSITION OF SPECIMEN:  N/A  COUNTS:  YES  TOURNIQUET:  * No tourniquets in log *  DICTATION: .Dragon Dictation  PLAN OF CARE: Admit to inpatient   PATIENT DISPOSITION:  PACU - hemodynamically stable.   Delay start of Pharmacological VTE agent (>24hrs) due to surgical blood loss or risk of bleeding: not applicable  Indications: 32 year old female prior cesarean x6, who presents at 39 0 weeks having regular contractions with cervical dilation of 2 cm..  The patient did fail to keep requested follow-up appointments in the office last week in order to schedule her surgery, and simply showed up when she began to have contractions.  This is a pattern.  Patient specifically denies desire for permanent sterilization, and alleges no illicit substance use in several weeks Details of procedure: Patient was taken the operating room prepped and draped for lower abdominal surgery transverse incision was made along the previous scar line, sharply dissected down to the fascia which was sharply dissected off the underlying rectus muscles with some moderate difficulty due to fibrosis.  The peritoneum was opened in the midline and fortunately there was almost no adhesions in the area of the lower uterine segment.  The Alexis retractor could be positioned, and there was some bladder  flap development.  Transverse incision was made in the area of the previous incisions which was quite thin 2 mm in thickness across the entire lower uterine segment.  This transverse incision was opened without difficulty and the baby delivered easily covered clamped at 1 minute and placenta delivered in response to uterine massage with EBL 500 cc total.  The uterus was swabbed out.  There was some membrane remnants that were extracted by this process. The uterus was then closed with 2 layer process running locking 0 Monocryl first layer and continuous running 0 Monocryl second layer. Abdomen was hemostatic, tubes and ovaries were inspected and appeared grossly normal.  The anterior peritoneum was closed using running 2-0 Vicryl the fascia closed with running 0 Vicryl subcutaneous tissues mobilized on both sides to allow for better tissue edge approximation, then interrupted 2 oh plain closure of the subcu skin fatty tissue layer and then subcuticular 4-0 Vicryl closure the skin itself patient tolerated procedure well went to recovery room in good condition sponge and needle counts correct

## 2017-12-10 LAB — CBC
HCT: 33.2 % — ABNORMAL LOW (ref 36.0–46.0)
Hemoglobin: 11.2 g/dL — ABNORMAL LOW (ref 12.0–15.0)
MCH: 33.1 pg (ref 26.0–34.0)
MCHC: 33.7 g/dL (ref 30.0–36.0)
MCV: 98.2 fL (ref 78.0–100.0)
PLATELETS: 218 10*3/uL (ref 150–400)
RBC: 3.38 MIL/uL — AB (ref 3.87–5.11)
RDW: 13.2 % (ref 11.5–15.5)
WBC: 14.5 10*3/uL — ABNORMAL HIGH (ref 4.0–10.5)

## 2017-12-10 LAB — HIV ANTIBODY (ROUTINE TESTING W REFLEX): HIV SCREEN 4TH GENERATION: NONREACTIVE

## 2017-12-10 LAB — RPR: RPR: NONREACTIVE

## 2017-12-10 MED ORDER — KETOROLAC TROMETHAMINE 10 MG PO TABS
10.0000 mg | ORAL_TABLET | Freq: Four times a day (QID) | ORAL | Status: DC | PRN
  Administered 2017-12-10 – 2017-12-12 (×4): 10 mg via ORAL
  Filled 2017-12-10 (×6): qty 1

## 2017-12-10 MED ORDER — TRAMADOL HCL 50 MG PO TABS
50.0000 mg | ORAL_TABLET | Freq: Four times a day (QID) | ORAL | Status: DC | PRN
  Administered 2017-12-10: 50 mg via ORAL
  Filled 2017-12-10: qty 1

## 2017-12-10 NOTE — Clinical Social Work Maternal (Signed)
CLINICAL SOCIAL WORK MATERNAL/CHILD NOTE  Patient Details  Name: Janet Dixon MRN: [id number] Date of Birth: Dixon 14, 1987  Date:  12/10/2017  Clinical Social Worker Initiating Note:  Madilyn Fireman, MSW, LCSW-A Date/Time: Initiated:  12/10/17/1336     Child's Name:  Janet Dixon   Biological Parents:  Mother, Father   Need for Interpreter:  None   Reason for Referral:  Current Substance Use/Substance Use During Pregnancy    Address:  [place] [address]    Phone number:  [phone] (home)     Additional phone number:   Household Members/Support Persons (HM/SP):   Household Member/Support Person 1, Household Member/Support Person 2, Household Member/Support Person 3   HM/SP Name Relationship DOB or Age  HM/SP -1 Janet Dixon Mother [phone]  HM/SP -2 Janet Dixon FOB    HM/SP -3 Janet Dixon FOB's Mother [address], [place], [place]  HM/SP -4        HM/SP -5        HM/SP -6        HM/SP -7        HM/SP -8          Natural Supports (not living in the home):  Children, Extended Family, Friends, Immediate Family   Professional Supports: None   Employment: Unemployed   Type of Work:     Education:  (S) Other (comment)(Did not assess)   Homebound arranged:    Museum/gallery curator Resources:      Other Resources:      Cultural/Religious Considerations Which [place] Impact Care:    Strengths:  Ability to meet basic needs , Home prepared for child , Pediatrician chosen   Psychotropic Medications:         Pediatrician:       Pediatrician List:   [facility]      Pediatrician Fax Number:    Risk Factors/Current Problems:  Substance Use    Cognitive State:  Alert , Flight of Ideas    Mood/Affect:  Calm , Comfortable    CSW Assessment: CSW received consult for [name] due to polysubstance and limited prenatal care / frequently missed  appointments. CSW met with [name] and baby Janet Dixon at bedside to complete assessment. CSW inquired with [name] regarding her substance use during pregnancy, [name] denies using any illegal substances since sometime in Dixon of this year. CSW inquired about [name]'s positive UDS for cocaine upon admission, [name] stated that she "must have touched it or picked some up" at her mother's house. [name] reports her mother Janet Dixon is a long time cocaine user. CSW informed [name] of baby's positive UDS for cocaine and she repeated the same statement as before. [name] denies any issues with her substance use, [name] states she can start and stop using whenever she wants.  [name] was educated on hospital drug screening policies. [name] informed CSW that she was expecting a visit due to her history. CSW informed [name] that a CPS report was made to [name] and it was accepted. CSW informed [name] to expect a visit from [place] on Tuesday morning. CSW explained to [name] the variation of roles between DSS and the hospital, [name] stated understanding. CSW inquired with [name] about her other six children- Janet [place], Janet Runner., Janet Dixon, Janet Dixon, Janet Dixon, and Janet Dixon. [name] reports that all of her children are in safe and stable homes with  their dad, grandmother, or godmother. [name] denies prior CPS involvement stating that all her children were voluntary placed in alternative living situations. [name] desires depo for her family planning method. [name] states that Janet Dixon's father is Janet Dixon, whom is involved and helps provide for them. At time of discharge, [name] reports she will take Janet Dixon to FOB's mother's Janet Dixon) home to live. [name] reports that her and Janet Dixon are in the process of obtaining their own housing. [name] reports that her 31 year old daughter Janet Dixon is currently being cared for by Janet Dixon.  [name] reports having a pack and play / bassinet combination to use at home for safe sleeping. SIDS precautions were thoroughly reviewed. [name] had a new car  seat in the room for safe transportation of Janet Dixon. [name] reports that her 18 year old daughter Janet Dixon goes to [employer], but [name] wanted some assistance from hospital staff on choosing a more stable practice. CSW informed [name] that a weekday [place] staff member would be available tomorrow if she were to develop questions or concerns, CSW encouraged [name] to reach out for assistance if needed. [name] pleasant to interact with and she expressed gratitude for CSW's nonjudgmental perspective and attitude.  Web designer Information / Addresses: -[address], [place], [place] - Address for [name]'s mother [name] -[address], [place], [place] - Address for [employer] mother Janet Dixon  There are barriers to discharge. Please do not discharge the baby until [place] completes their visit on Tuesday 12/12/17.   **[name] requests that no discussion regarding substance use or CPS involvement take place while visitors or FOB is present.**  CSW Plan/Description:  CSW Will Continue to Monitor Umbilical Cord Tissue Drug Screen Results and Make Report if Warranted, CSW Awaiting CPS Disposition Plan, Child Protective Service Report     Archie Endo, LCSWA 12/10/2017, 1:40 PM

## 2017-12-10 NOTE — Progress Notes (Signed)
POSTPARTUM PROGRESS NOTE  Post Partum Day 1 Subjective:  Janet Dixon is a 32 y.o. [id number] 6w0ds/p rLTCS.  No acute events overnight.  Pt denies problems with ambulating, voiding or po intake.  She denies nausea or vomiting. Lochia Moderate.  Pain is poorly controlled per patient report (7/10 on pain scale). Patient reports pain is uterine crampy pain (not specifically pain around incision site).   Objective: Blood pressure 112/77, pulse 77, temperature 98.4 F (36.9 C), temperature source Oral, resp. rate 20, weight 60.3 kg, last menstrual period 03/11/2017, SpO2 97 %  Physical Exam:  General: alert, cooperative and no distress Chest: no respiratory distress Uterine Fundus: firm, appropriately tender DVT Evaluation: No calf swelling or tenderness Extremities: no edema  Recent Labs    12/09/17 0950 12/10/17 0542  HGB 13.1 11.2*  HCT 38.5 33.2*    Assessment/Plan:  ASSESSMENT: Janet STEINMETZis a 32y.o. [id number]/p rLTCS.  - Contraception: Patient expressed interest in a LARC. Still deciding which one. Will plan to get it at postpartum visit.  - Feeding: Patient is currently bottle feeding. Patient has been encouraged not to breastfeed for a couple of days due to +UDS with cocaine yesterday (on day of delivery).  - SW consult made but haven't yet seen patient - Pain: patient has ibuprofen allergy, given tylenol this morning, given 1 dose of oxycodone last night, need plan for pain control given polysubstance abuse hx    LOS: 1 day   JuRockey SituS3 12/10/2017, 6:57 AM

## 2017-12-10 NOTE — Progress Notes (Signed)
CSW made CPS report to [facility], report was accepted. Junie Panning, [facility] CPS informed CSW that she staffed the case with her supervisor. [facility] CPS supervisor to assign case to a CPS Investigator and CPS staff person will come to [facility] on Tuesday 12/12/17 for a meeting for assessment and discharge planning.  Do not discharge infant until [place] visit is completed.  Madilyn Fireman, MSW, [place] Social Worker [facility] [phone]

## 2017-12-10 NOTE — Anesthesia Postprocedure Evaluation (Signed)
Anesthesia Post Note  Patient: Janet Dixon  Procedure(s) Performed: CESAREAN SECTION (N/A )     Patient location during evaluation: Mother Baby Anesthesia Type: Spinal Level of consciousness: awake and alert Pain management: pain level controlled Vital Signs Assessment: post-procedure vital signs reviewed and stable Respiratory status: spontaneous breathing Cardiovascular status: blood pressure returned to baseline Postop Assessment: no headache, no backache, spinal receding, able to ambulate, adequate PO intake, no apparent nausea or vomiting and patient able to bend at knees Anesthetic complications: no    Last Vitals:  Vitals:   12/10/17 0455 12/10/17 0634  BP: 125/71   Pulse: 73 61  Resp: 20   Temp: 36.8 C   SpO2: 100% 98%    Last Pain:  Vitals:   12/10/17 0634  TempSrc:   PainSc: 7    Pain Goal: Patients Stated Pain Goal: 3 (12/10/17 0634)               Marla Roe

## 2017-12-10 NOTE — Addendum Note (Signed)
Addendum  created 12/10/17 0820 by Talbot Grumbling, CRNA   Sign clinical note

## 2017-12-11 ENCOUNTER — Encounter (HOSPITAL_COMMUNITY): Payer: Self-pay | Admitting: *Deleted

## 2017-12-11 MED ORDER — TRAMADOL HCL 50 MG PO TABS: 50.0000 mg | ORAL_TABLET | Freq: Four times a day (QID) | ORAL | Status: DC

## 2017-12-11 MED ORDER — MEDROXYPROGESTERONE ACETATE 150 MG/ML IM SUSP: 150.0000 mg | Freq: Once | INTRAMUSCULAR | Status: DC

## 2017-12-11 MED ORDER — TRAMADOL HCL 50 MG PO TABS
100.0000 mg | ORAL_TABLET | Freq: Four times a day (QID) | ORAL | Status: DC
  Administered 2017-12-11 – 2017-12-12 (×6): 100 mg via ORAL
  Filled 2017-12-11 (×6): qty 2

## 2017-12-11 NOTE — Progress Notes (Signed)
Subjective: Postpartum Day 2: Cesarean Delivery Patient reports incisional pain, tolerating PO, + flatus and no problems voiding. Pain is not adequately controlled w/ Toradol. Had one dose Oxy 5 and 1 dose Ultram 50 yesterday. Not clear why Ultram has not been continued. Not ambulating much due to pain.   Objective: Vital signs in last 24 hours: Temp:  [97.8 F (36.6 C)-99 F (37.2 C)] 97.8 F (36.6 C) (09/02 1527) Pulse Rate:  [80-96] 96 (09/02 1527) Resp:  [17-18] 17 (09/02 1527) BP: (122-131)/(72-89) 124/89 (09/02 1527) SpO2:  [100 %] 100 % (09/02 1527)  Physical Exam:  General: alert, cooperative, appears stated age and no distress Lochia: appropriate Uterine Fundus: firm Incision: no significant drainage DVT Evaluation: No evidence of DVT seen on physical exam.  Recent Labs    12/09/17 0950 12/10/17 0542  HGB 13.1 11.2*  HCT 38.5 33.2*    Assessment/Plan: Status post Cesarean section. Doing well postoperatively.  Continue current care. Increase Ultram to 100 Q6 hours on schedule [facility] CPS meeting tomorrow.  Plan D/C tomorrow  Manya Silvas 12/11/2017, 7:24 PM

## 2017-12-11 NOTE — Lactation Note (Signed)
This note was copied from a baby's chart. Lactation Consultation Note  Patient Name: Janet Dixon [id number] Date: 12/11/2017   Mother and baby tested positive for cocaine so mother is exclusively formula feeding.      Maternal Data    Feeding    LATCH Score                   Interventions    Lactation Tools Discussed/Used     Consult Status      Vivianne Master [facility] 12/11/2017, 10:04 AM

## 2017-12-11 NOTE — Plan of Care (Signed)
Pt. Condition will continue to improve

## 2017-12-12 ENCOUNTER — Telehealth: Payer: Self-pay | Admitting: *Deleted

## 2017-12-12 LAB — RUBELLA SCREEN: Rubella: 2.51 index (ref >0.99)

## 2017-12-12 MED ORDER — SENNOSIDES-DOCUSATE SODIUM 8.6-50 MG PO TABS: 2.0000 | ORAL_TABLET | ORAL | 0 refills | Status: DC

## 2017-12-12 MED ORDER — OXYCODONE HCL 5 MG PO TABS: 5.0000 mg | ORAL_TABLET | ORAL | 0 refills | Status: DC | PRN

## 2017-12-12 MED ORDER — IBUPROFEN 800 MG PO TABS: 800.0000 mg | ORAL_TABLET | Freq: Three times a day (TID) | ORAL | 0 refills | Status: DC | PRN

## 2017-12-12 NOTE — Discharge Instructions (Signed)
Postpartum Care After Cesarean Delivery The period of time right after you deliver your newborn is called the postpartum period. What kind of medical care will I receive?  You may continue to receive fluids and medicines through an IV tube inserted into one of your veins.  You may have small, flexible tube (catheter) draining urine from your bladder into a bag outside of your body. The catheter will be removed as soon as possible.  You may be given a squirt bottle to use when you go to the bathroom. You may use this until you are comfortable wiping as usual. To use the squirt bottle, follow these steps: ? Before you urinate, fill the squirt bottle with warm water. The water should be warm. Do not use hot water. ? After you urinate, while you are sitting on the toilet, use the squirt bottle to rinse the area around your urethra and vaginal opening. This rinses away any urine and blood. ? You may do this instead of wiping. As you start healing, you may use the squirt bottle before wiping yourself. Make sure to wipe gently. ? Fill the squirt bottle with clean water every time you use the bathroom.  You will be given sanitary pads to wear.  Your incision will be monitored to make sure it is healing properly. You will be told when it is safe for your stitches, staples, or skin adhesive tape to be removed. What can I expect?  You may not feel the need to urinate for several hours after delivery.  You will have some soreness and pain in your abdomen. You may have a small amount of blood or clear fluid coming from your incision.  If you are breastfeeding, you may have uterine contractions every time you breastfeed for up to several weeks postpartum. Uterine contractions help your uterus return to its normal size.  It is normal to have vaginal bleeding (lochia) after delivery. The amount and appearance of lochia is often similar to a menstrual period in the first week after delivery. It will  gradually decrease over the next few weeks to a dry, yellow-brown discharge. For most women, lochia stops completely by 6-8 weeks after delivery. Vaginal bleeding can vary from woman to woman.  Within the first few days after delivery, you may have breast engorgement. This is when your breasts feel heavy, full, and uncomfortable. Your breasts may also throb and feel hard, tightly stretched, warm, and tender. After this occurs, you may have milk leaking from your breasts.Your health care provider can help you relieve discomfort due to breast engorgement. Breast engorgement should go away within a few days.  You may feel more sad or worried than normal due to hormonal changes after delivery. These feelings should not last more than a few days. If these feelings do not go away after several days, speak with your health care provider. How should I care for myself?  Tell your health care provider if you have pain or discomfort.  Drink enough water to keep your urine clear or pale yellow.  Wash your hands thoroughly with soap and water for at least 20 seconds after changing your sanitary pads or using the toilet, and before holding or feeding your baby.  If you are not breastfeeding, avoid touching your breasts a lot. Doing this can make your breasts produce more milk.  If you become weak or lightheaded, or you feel like you might faint, ask for help before: ? Getting out of bed. ? Showering.  Change your sanitary pads frequently. Watch for any changes in your flow, such as a sudden increase in volume, a change in color, or the passing of large blood clots. If you pass a blood clot from your vagina, save it to show to your health care provider. Do not flush blood clots down the toilet without having your health care provider look at them.  Make sure that all your vaccinations are up to date. This can help protect you and your baby from getting certain diseases. You may need to have immunizations done  before you leave the hospital.  If desired, talk with your health care provider about methods of family planning or birth control (contraception). How can I start bonding with my baby? Spending as much time as possible with your baby is very important. During this time, you and your baby can get to know each other and develop a bond. Having your baby stay with you in your room (rooming in) can give you time to get to know your baby. Rooming in can also help you become comfortable caring for your baby. Breastfeeding can also help you bond with your baby. How can I plan for returning home with my baby?  Make sure that you have a car seat installed in your vehicle. ? Your car seat should be checked by a certified car seat installer to make sure that it is installed safely. ? Make sure that your baby fits into the car seat safely.  Ask your health care provider any questions you have about caring for yourself or your baby. Make sure that you are able to contact your health care provider with any questions after leaving the hospital. This information is not intended to replace advice given to you by your health care provider. Make sure you discuss any questions you have with your health care provider. Document Released: 12/21/2011 Document Revised: 08/31/2015 Document Reviewed: 03/02/2015 Elsevier Interactive Patient Education  [employer].

## 2017-12-12 NOTE — Progress Notes (Signed)
CSW spoke [place] worker, Larina Bras.  CPS informed CSW that CPS will visit with [name] and infant at the hospital this morning.  CPS will folllow-up with CSW regarding disposition plan for infant.   At this time there are barriers to infant's discharge.   Laurey Arrow, MSW, LCSW Clinical Social Work [phone]

## 2017-12-12 NOTE — Discharge Summary (Signed)
OB Discharge Summary     Patient Name: Janet Dixon DOB: 11/02/1985 MRN: [id number]  Date of admission: 12/09/2017 Delivering MD: Jonnie Kind   Date of discharge: 12/12/2017  Admitting diagnosis: CTX Intrauterine pregnancy: [redacted]w[redacted]d    Secondary diagnosis:  Active Problems:   [place]   Seizure disorder (HCC)   Elevated lipase   History of C-section   High-risk pregnancy   Drug use affecting pregnancy   Pregnancy affected by fetal growth restriction   Status post repeat low transverse cesarean section      Discharge diagnosis: Term Pregnancy Delivered                                                                                                Post partum procedures:none   Complications: None  Hospital course:  Sceduled C/S   32y.o. yo [id number] admitted to the hospital 12/09/2017 for scheduled cesarean section with the following indication:Elective Repeat.  Membrane Rupture Time/Date: 12:27 PM ,12/09/2017   Patient delivered a Viable infant.12/09/2017  Details of operation can be found in separate operative note.  Pateint had an uncomplicated postpartum course.  She is ambulating, tolerating a regular diet, passing flatus, and urinating well. Patient is discharged home in stable condition on  12/12/17         Physical exam  Vitals:   12/11/17 1527 12/11/17 2214 12/12/17 0644 12/12/17 1437  BP: 124/89 (!) 139/93 113/65 127/79  Pulse: 96 85 76 87  Resp: 17  18 17   Temp: 97.8 F (36.6 C)  98.4 F (36.9 C) 98.1 F (36.7 C)  TempSrc: Oral  Oral Oral  SpO2: 100%  97% 98%  Weight:       General: alert and cooperative Lochia: appropriate Uterine Fundus: firm Incision: Dressing is clean, dry, and intact DVT Evaluation: No evidence of DVT seen on physical exam. Labs: Lab Results  Component Value Date   WBC 14.5 (H) 12/10/2017   HGB 11.2 (L) 12/10/2017   HCT 33.2 (L) 12/10/2017   MCV 98.2 12/10/2017   PLT 218 12/10/2017   CMP Latest  Ref Rng & Units 04/21/2017  Glucose 65 - 99 mg/dL 112(H)  BUN 6 - 20 mg/dL 16  Creatinine 0.44 - 1.00 mg/dL 0.78  Sodium 135 - 145 mmol/L 134(L)  Potassium 3.5 - 5.1 mmol/L 2.8(L)  Chloride 101 - 111 mmol/L 98(L)  CO2 22 - 32 mmol/L 24  Calcium 8.9 - 10.3 mg/dL 9.5  Total Protein 6.5 - 8.1 g/dL 8.4(H)  Total Bilirubin 0.3 - 1.2 mg/dL 0.6  Alkaline Phos 38 - 126 U/L 76  AST 15 - 41 U/L 18  ALT 14 - 54 U/L 20    Discharge instruction: per After Visit Summary and "Baby and Me Booklet".  After visit meds:  Allergies as of 12/12/2017      Reactions   Macrobid [nitrofurantoin Macrocrystal] Shortness Of Breath, Nausea And Vomiting, Other (See Comments)   dizziness   Diphenhydramine Hcl Other (See Comments)   Unknown-patient states that she does take this medication   Ibuprofen Other (  See Comments)   Patient states that syncope has occurred after taking this medication   Flexeril [cyclobenzaprine Hcl] Rash      Medication List    TAKE these medications   albuterol 108 (90 Base) MCG/ACT inhaler Commonly known as:  PROVENTIL HFA;VENTOLIN HFA Inhale 2 puffs into the lungs every 6 (six) hours as needed for wheezing or shortness of breath.   ibuprofen 800 MG tablet Commonly known as:  ADVIL,MOTRIN Take 1 tablet (800 mg total) by mouth every 8 (eight) hours as needed.   oxyCODONE 5 MG immediate release tablet Commonly known as:  Oxy IR/ROXICODONE Take 1 tablet (5 mg total) by mouth every 4 (four) hours as needed for severe pain.   PNV PRENATAL PLUS MULTIVITAMIN 27-1 MG Tabs Take 1 tablet by mouth daily.   senna-docusate 8.6-50 MG tablet Commonly known as:  Senokot-S Take 2 tablets by mouth daily. Start taking on:  12/13/2017       Diet: routine diet  Activity: Advance as tolerated. Pelvic rest for 6 weeks.   Outpatient follow up: 1 week for post-op follow up  Follow up Appt:No future appointments. Follow up Visit:No follow-ups on file.  Postpartum contraception: Depo  Provera  Newborn Data: Live born female  Birth Weight: 6 lb 6.8 oz (2914 g) APGAR: 8, 9  Newborn Delivery   Birth date/time:  12/09/2017 12:28:00 Delivery type:  C-Section, Low Transverse Trial of labor:  No C-section categorization:  Repeat     Baby Feeding: Bottle Disposition:home with mother   Patient with h/o polysubstance abuse affecting pregnancy. Discussed with Dr. Elly Modena. Will d/c with Oxycodone IR 5 mg #10 tablets. Patient instructed to follow up at [facility] clinic in one week for further assessment of pain control. SW saw mother & infant and found no barriers to discharge. CPS will f/u outpatient.   12/12/2017 Melina Schools, DO

## 2017-12-12 NOTE — Telephone Encounter (Signed)
Lmom for pt to call us back to schedule pp appointment.  12-12-17  AS

## 2017-12-13 ENCOUNTER — Telehealth: Payer: Self-pay | Admitting: Obstetrics and Gynecology

## 2017-12-13 NOTE — Telephone Encounter (Signed)
LM for patient to call office for postpartum appointment. 12/13/17 JM

## 2017-12-18 ENCOUNTER — Encounter: Payer: Medicaid Other | Admitting: Obstetrics and Gynecology

## 2017-12-21 ENCOUNTER — Encounter: Payer: Medicaid Other | Admitting: Obstetrics and Gynecology

## 2017-12-21 ENCOUNTER — Telehealth: Payer: Self-pay | Admitting: Obstetrics and Gynecology

## 2017-12-21 NOTE — Telephone Encounter (Signed)
Pt missed appt, allegedly "got no ride", will call back and reschedule. Pt reminded that She must go to [facility] to get MA activated for August delivery, or costs will be hers.

## 2017-12-25 ENCOUNTER — Telehealth: Payer: Self-pay | Admitting: Obstetrics and Gynecology

## 2017-12-25 ENCOUNTER — Encounter: Payer: Self-pay | Admitting: Obstetrics and Gynecology

## 2017-12-25 NOTE — Telephone Encounter (Signed)
Pt missed appointment  Again.

## 2018-08-04 ENCOUNTER — Observation Stay (HOSPITAL_COMMUNITY)
Admission: EM | Admit: 2018-08-04 | Discharge: 2018-08-05 | Disposition: A | Discharge status: Home / Self Care | Payer: Medicaid Other | Attending: Family Medicine | Admitting: Family Medicine

## 2018-08-04 ENCOUNTER — Encounter: Payer: Self-pay | Admitting: Emergency Medicine

## 2018-08-04 ENCOUNTER — Emergency Department (HOSPITAL_COMMUNITY): Payer: Medicaid Other

## 2018-08-04 ENCOUNTER — Other Ambulatory Visit: Payer: Self-pay

## 2018-08-04 DIAGNOSIS — N390 Urinary tract infection, site not specified: Secondary | ICD-10-CM | POA: Diagnosis not present

## 2018-08-04 DIAGNOSIS — R1110 Vomiting, unspecified: Secondary | ICD-10-CM

## 2018-08-04 DIAGNOSIS — Z79899 Other long term (current) drug therapy: Secondary | ICD-10-CM | POA: Diagnosis not present

## 2018-08-04 DIAGNOSIS — F1990 Other psychoactive substance use, unspecified, uncomplicated: Secondary | ICD-10-CM | POA: Insufficient documentation

## 2018-08-04 DIAGNOSIS — Z309 Encounter for contraceptive management, unspecified: Secondary | ICD-10-CM | POA: Diagnosis not present

## 2018-08-04 DIAGNOSIS — R030 Elevated blood-pressure reading, without diagnosis of hypertension: Secondary | ICD-10-CM | POA: Diagnosis not present

## 2018-08-04 DIAGNOSIS — R112 Nausea with vomiting, unspecified: Secondary | ICD-10-CM | POA: Diagnosis not present

## 2018-08-04 DIAGNOSIS — Z886 Allergy status to analgesic agent status: Secondary | ICD-10-CM | POA: Insufficient documentation

## 2018-08-04 DIAGNOSIS — J45909 Unspecified asthma, uncomplicated: Secondary | ICD-10-CM | POA: Diagnosis not present

## 2018-08-04 DIAGNOSIS — F17210 Nicotine dependence, cigarettes, uncomplicated: Secondary | ICD-10-CM | POA: Insufficient documentation

## 2018-08-04 DIAGNOSIS — Z791 Long term (current) use of non-steroidal anti-inflammatories (NSAID): Secondary | ICD-10-CM | POA: Diagnosis not present

## 2018-08-04 DIAGNOSIS — G40909 Epilepsy, unspecified, not intractable, without status epilepticus: Secondary | ICD-10-CM | POA: Diagnosis not present

## 2018-08-04 DIAGNOSIS — R1084 Generalized abdominal pain: Secondary | ICD-10-CM | POA: Diagnosis not present

## 2018-08-04 DIAGNOSIS — Z888 Allergy status to other drugs, medicaments and biological substances status: Secondary | ICD-10-CM | POA: Insufficient documentation

## 2018-08-04 DIAGNOSIS — Z7951 Long term (current) use of inhaled steroids: Secondary | ICD-10-CM | POA: Diagnosis not present

## 2018-08-04 DIAGNOSIS — E876 Hypokalemia: Secondary | ICD-10-CM | POA: Insufficient documentation

## 2018-08-04 DIAGNOSIS — Z881 Allergy status to other antibiotic agents status: Secondary | ICD-10-CM | POA: Diagnosis not present

## 2018-08-04 DIAGNOSIS — R109 Unspecified abdominal pain: Secondary | ICD-10-CM | POA: Diagnosis not present

## 2018-08-04 DIAGNOSIS — N12 Tubulo-interstitial nephritis, not specified as acute or chronic: Principal | ICD-10-CM | POA: Diagnosis present

## 2018-08-04 LAB — CBC WITH DIFFERENTIAL/PLATELET
Abs Immature Granulocytes: 0.03 10*3/uL (ref 0.00–0.07)
Basophils Absolute: 0 10*3/uL (ref 0.0–0.1)
Basophils Relative: 0 %
Eosinophils Absolute: 0 10*3/uL (ref 0.0–0.5)
Eosinophils Relative: 0 %
HCT: 42.1 % (ref 36.0–46.0)
Hemoglobin: 14.8 g/dL (ref 12.0–15.0)
Immature Granulocytes: 0 %
Lymphocytes Relative: 21 %
Lymphs Abs: 2.9 10*3/uL (ref 0.7–4.0)
MCH: 32.2 pg (ref 26.0–34.0)
MCHC: 35.2 g/dL (ref 30.0–36.0)
MCV: 91.7 fL (ref 80.0–100.0)
Monocytes Absolute: 1.2 10*3/uL — ABNORMAL HIGH (ref 0.1–1.0)
Monocytes Relative: 8 %
Neutro Abs: 10.1 10*3/uL — ABNORMAL HIGH (ref 1.7–7.7)
Neutrophils Relative %: 71 %
Platelets: 393 10*3/uL (ref 150–400)
RBC: 4.59 MIL/uL (ref 3.87–5.11)
RDW: 13.1 % (ref 11.5–15.5)
WBC: 14.2 10*3/uL — ABNORMAL HIGH (ref 4.0–10.5)
nRBC: 0 % (ref 0.0–0.2)

## 2018-08-04 LAB — RAPID URINE DRUG SCREEN, HOSP PERFORMED
Amphetamines: NOT DETECTED
Barbiturates: NOT DETECTED
Benzodiazepines: NOT DETECTED
Cocaine: POSITIVE — AB
Opiates: NOT DETECTED
Tetrahydrocannabinol: POSITIVE — AB

## 2018-08-04 LAB — COMPREHENSIVE METABOLIC PANEL
ALT: 27 U/L (ref 0–44)
AST: 31 U/L (ref 15–41)
Albumin: 5 g/dL (ref 3.5–5.0)
Alkaline Phosphatase: 70 U/L (ref 38–126)
Anion gap: 18 — ABNORMAL HIGH (ref 5–15)
BUN: 26 mg/dL — ABNORMAL HIGH (ref 6–20)
CO2: 22 mmol/L (ref 22–32)
Calcium: 9.5 mg/dL (ref 8.9–10.3)
Chloride: 98 mmol/L (ref 98–111)
Creatinine, Ser: 0.94 mg/dL (ref 0.44–1.00)
GFR calc Af Amer: >60 mL/min (ref >60)
GFR calc non Af Amer: >60 mL/min (ref >60)
Glucose, Bld: 99 mg/dL (ref 70–99)
Potassium: 3.5 mmol/L (ref 3.5–5.1)
Sodium: 138 mmol/L (ref 135–145)
Total Bilirubin: 1.3 mg/dL — ABNORMAL HIGH (ref 0.3–1.2)
Total Protein: 8.4 g/dL — ABNORMAL HIGH (ref 6.5–8.1)

## 2018-08-04 LAB — URINALYSIS, ROUTINE W REFLEX MICROSCOPIC
Bilirubin Urine: NEGATIVE
Glucose, UA: NEGATIVE mg/dL
Ketones, ur: 80 mg/dL — AB
Nitrite: NEGATIVE
Protein, ur: 100 mg/dL — AB
Specific Gravity, Urine: 1.039 — ABNORMAL HIGH (ref 1.005–1.030)
WBC, UA: >50 WBC/hpf — ABNORMAL HIGH (ref 0–5)
pH: 5 (ref 5.0–8.0)

## 2018-08-04 LAB — CBC
HCT: 37.1 % (ref 36.0–46.0)
Hemoglobin: 13 g/dL (ref 12.0–15.0)
MCH: 32.3 pg (ref 26.0–34.0)
MCHC: 35 g/dL (ref 30.0–36.0)
MCV: 92.1 fL (ref 80.0–100.0)
Platelets: 330 10*3/uL (ref 150–400)
RBC: 4.03 MIL/uL (ref 3.87–5.11)
RDW: 13.2 % (ref 11.5–15.5)
WBC: 13.6 10*3/uL — ABNORMAL HIGH (ref 4.0–10.5)
nRBC: 0 % (ref 0.0–0.2)

## 2018-08-04 LAB — PREGNANCY, URINE: Preg Test, Ur: NEGATIVE

## 2018-08-04 LAB — CREATININE, SERUM
Creatinine, Ser: 0.67 mg/dL (ref 0.44–1.00)
GFR calc Af Amer: >60 mL/min (ref >60)
GFR calc non Af Amer: >60 mL/min (ref >60)

## 2018-08-04 LAB — TROPONIN I: Troponin I: <0.03 ng/mL (ref <0.03)

## 2018-08-04 LAB — ETHANOL: Alcohol, Ethyl (B): <10 mg/dL (ref <10)

## 2018-08-04 LAB — LIPASE, BLOOD: Lipase: 27 U/L (ref 11–51)

## 2018-08-04 MED ORDER — PROCHLORPERAZINE EDISYLATE 10 MG/2ML IJ SOLN
10.0000 mg | Freq: Four times a day (QID) | INTRAMUSCULAR | Status: DC | PRN
  Administered 2018-08-05: 10 mg via INTRAVENOUS
  Filled 2018-08-04: qty 2

## 2018-08-04 MED ORDER — METOCLOPRAMIDE HCL 5 MG/ML IJ SOLN
10.0000 mg | Freq: Once | INTRAMUSCULAR | Status: AC
  Administered 2018-08-04: 10 mg via INTRAVENOUS
  Filled 2018-08-04: qty 2

## 2018-08-04 MED ORDER — ACETAMINOPHEN 650 MG RE SUPP: 650.0000 mg | Freq: Four times a day (QID) | RECTAL | Status: DC | PRN

## 2018-08-04 MED ORDER — ACETAMINOPHEN 325 MG PO TABS: 650.0000 mg | ORAL_TABLET | Freq: Four times a day (QID) | ORAL | Status: DC | PRN

## 2018-08-04 MED ORDER — KETOROLAC TROMETHAMINE 30 MG/ML IJ SOLN
30.0000 mg | Freq: Four times a day (QID) | INTRAMUSCULAR | Status: DC | PRN
  Administered 2018-08-04 – 2018-08-05 (×3): 30 mg via INTRAVENOUS
  Filled 2018-08-04 (×3): qty 1

## 2018-08-04 MED ORDER — ONDANSETRON 4 MG PO TBDP
4.0000 mg | ORAL_TABLET | Freq: Once | ORAL | Status: AC
  Administered 2018-08-04: 16:00:00 4 mg via ORAL
  Filled 2018-08-04: qty 1

## 2018-08-04 MED ORDER — SODIUM CHLORIDE 0.9 % IV SOLN
INTRAVENOUS | Status: DC
  Administered 2018-08-04: 19:00:00 via INTRAVENOUS

## 2018-08-04 MED ORDER — CEPHALEXIN 500 MG PO CAPS: 500.0000 mg | ORAL_CAPSULE | Freq: Four times a day (QID) | ORAL | 0 refills | Status: DC

## 2018-08-04 MED ORDER — SODIUM CHLORIDE 0.9 % IV SOLN
1.0000 g | Freq: Once | INTRAVENOUS | Status: AC
  Administered 2018-08-04: 1 g via INTRAVENOUS
  Filled 2018-08-04: qty 10

## 2018-08-04 MED ORDER — SODIUM CHLORIDE 0.9 % IV SOLN
1.0000 g | INTRAVENOUS | Status: DC
  Filled 2018-08-04 (×2): qty 10

## 2018-08-04 MED ORDER — PROMETHAZINE HCL 25 MG/ML IJ SOLN
25.0000 mg | Freq: Once | INTRAMUSCULAR | Status: AC
  Administered 2018-08-04: 09:00:00 25 mg via INTRAVENOUS
  Filled 2018-08-04: qty 1

## 2018-08-04 MED ORDER — SODIUM CHLORIDE 0.9 % IV BOLUS
2000.0000 mL | Freq: Once | INTRAVENOUS | Status: AC
  Administered 2018-08-04: 1000 mL via INTRAVENOUS

## 2018-08-04 MED ORDER — IOHEXOL 300 MG/ML  SOLN
100.0000 mL | Freq: Once | INTRAMUSCULAR | Status: AC | PRN
  Administered 2018-08-04: 100 mL via INTRAVENOUS

## 2018-08-04 MED ORDER — ONDANSETRON HCL 4 MG/2ML IJ SOLN
4.0000 mg | Freq: Four times a day (QID) | INTRAMUSCULAR | Status: DC | PRN
  Administered 2018-08-04 – 2018-08-05 (×3): 4 mg via INTRAVENOUS
  Filled 2018-08-04 (×3): qty 2

## 2018-08-04 MED ORDER — ENOXAPARIN SODIUM 40 MG/0.4ML ~~LOC~~ SOLN
40.0000 mg | SUBCUTANEOUS | Status: DC
  Administered 2018-08-04: 40 mg via SUBCUTANEOUS
  Filled 2018-08-04: qty 0.4

## 2018-08-04 NOTE — ED Notes (Signed)
Pt attempted to drink cranberry juice after taking Zofran ODT and could not keep juice down; pt vomitted.

## 2018-08-04 NOTE — ED Triage Notes (Signed)
Pt states she has takes percocet "recreational" and then her stomach began to vomit. Pt states she has pancreatis and has been vomiting since Tuesday off and on.

## 2018-08-04 NOTE — ED Notes (Signed)
Patient transported to CT 

## 2018-08-04 NOTE — H&P (Addendum)
[facility] Admission History and Physical Service Pager: [phone]  Patient name: Janet Dixon Medical record number: [id number] Date of birth: 01-17-1986 Age: 33 y.o. Gender: female  Primary Care Provider: Patient, No Pcp Per Consultants: None Code Status:   Code Status: Full Code   Chief Complaint: Abdominal pain, N/V  Assessment and Plan: Janet Dixon is a 33 y.o. female presenting with 5 days of abdominal pain nausea and vomiting. PMH is significant for  has a past medical history of Asthma, substance use disorder, history of chlamydia, History of gonorrhea, History of trichomoniasis, and Seizures ([place]).  #Pyelonephritis Most likely cause patient's refractory nausea and vomiting and supported by patient's UA.  Urine culture pending.  Status post ceftriaxone in the ED.  Unable to tolerate p.o.  Although patient is young, given that she is female and using cocaine, will obtain EKG and troponin to rule out cardiac causes of abdominal pain such as atypical presentation of ACS.  Will admit for observation with antiemetics, pain control, antibiotics, maintenance IV fluids  Admit to MedSurg, [facility] 1, attending Dr. Owens Shark  Continue ceftriaxone  Normal saline  EKG  Troponin x1  Toradol, Tylenol for pain  Compazine, Zofran  #Asthma Satting well on room air.  Does not have any controller assisted in medication list.  No wheeze on exam.  Monitor respiratory status  #Seizure disorder Listed in problems.  Patient not on any current antiepileptics medication.  Monitor neuro status  #Substance use disorder Primarily cocaine and THC.  Social work to provide cessation resources  #Contraception Negative pregnancy test.  Offer patient Nexplanon.  FEN/GI: Regular Diet Prophylaxis: Lovenox  Disposition: Observation  History of Present Illness:  Janet Dixon is a 33 y.o. female presenting with 5 days of abdominal pain associate with nausea and  vomiting.  Also endorses some diarrhea and dysuria.  Has vomited several times over the past 24 hours.  Unable to say how much.  The abdominal pain is sharp and is generalized bowel.  Patient rates the pain is 10 of 10.  Patient denies any back pain.  Patient is been able to tolerate some p.o. at the beginning of the week but has had less success going forward through the week.  Her vomitus is nonbloody nonbilious.  Patient is tried taking Percocet without improvement.  Patient was evaluated in the ED.  Patient was borderline febrile with a temperature of 100.2 Fahrenheit.  Otherwise vital signs are stable.  CMP, lipase were unremarkable. WBC 14. U pregt negative.  UDS positive for cocaine and THC.  Ethanol negative.  UA was significant for large hemoglobin, moderate leuks, greater than 50 to BCs and many bacteria with few squamous cells.  Patient was started on treatment for pyelonephritis including ceftriaxone for antibiotics.  Patient had refractory nausea that was resistant to Phenergan, Zofran, Reglan.  Patient actively vomiting in the room during the admission.  CT abdomen was negative for acute abdominal findings.  Review Of Systems: As per HPI  And incluiding Review of Systems  Genitourinary:       Denies vaginal discharge.    Patient Active Problem List   Diagnosis Date Noted  . Pyelonephritis 08/04/2018  . Status post repeat low transverse cesarean section 12/09/2017  . Drug use affecting pregnancy 06/08/2017  . Elevated lipase 04/14/2017  . Stomach pain 04/14/2017  . History of C-section 04/14/2017  . Hematuria 12/07/2016  . History of chlamydia 12/07/2016  . Papanicolaou smear of cervix with positive  high risk human papilloma virus (HPV) test 09/07/2016  . Trichimoniasis 08/29/2016  . Seizure disorder ([place]) 05/01/2014  . Cocaine abuse ([place]) 03/14/2014    Past Medical History: Past Medical History:  Diagnosis Date  . Asthma   . History of chlamydia   . History of gonorrhea    . History of trichomoniasis   . Seizures ([place])     Past Surgical History: Past Surgical History:  Procedure Laterality Date  . CESAREAN SECTION    . CESAREAN SECTION N/A 12/02/2014   Procedure: REPEAT CESAREAN SECTION;  Surgeon: Jonnie Kind, MD;  Location: [facility];  Service: Obstetrics;  Laterality: N/A;  . CESAREAN SECTION N/A 12/09/2017   Procedure: CESAREAN SECTION;  Surgeon: Jonnie Kind, MD;  Location: [facility];  Service: Obstetrics;  Laterality: N/A;    Social History: Social History   Tobacco Use  . Smoking status: Current Every Day Smoker    Packs/day: 0.50    Years: 13.00    Pack years: 6.50    Types: Cigarettes  . Smokeless tobacco: Never Used  . Tobacco comment: 3 per day  Substance Use Topics  . Alcohol use: No    Comment: none x 1 month ago  . Drug use: Yes    Types: Marijuana, Cocaine    Comment: states its been almost a year   Additional social history: Denies using cocaine and marijuana.  Declines any intervention to assist with usage. Please also refer to relevant sections of EMR.  Family History: Family History  Problem Relation Age of Onset  . Hypertension Mother   . Asthma Brother   . Asthma Daughter   . Eczema Daughter   . Asthma Son   . Eczema Son   . Aneurysm Maternal Grandmother   . Heart disease Maternal Grandmother   . Cancer Maternal Grandmother   . Asthma Daughter   . Eczema Daughter   . Asthma Son   . Eczema Son   . Eczema Son   . Cancer Maternal Grandfather        colon  . Eczema Maternal Aunt      Allergies and Medications: Allergies  Allergen Reactions  . Macrobid [Nitrofurantoin Macrocrystal] Shortness Of Breath, Nausea And Vomiting and Other (See Comments)    dizziness  . Diphenhydramine Hcl Other (See Comments)    Unknown-patient states that she does take this medication  . Ibuprofen Other (See Comments)    Patient states that syncope has occurred after taking this medication  . Flexeril  [Cyclobenzaprine Hcl] Rash   No current facility-administered medications on file prior to encounter.    Current Outpatient Medications on File Prior to Encounter  Medication Sig Dispense Refill  . albuterol (PROVENTIL HFA;VENTOLIN HFA) 108 (90 Base) MCG/ACT inhaler Inhale 2 puffs into the lungs every 6 (six) hours as needed for wheezing or shortness of breath. 1 Inhaler 1  . ibuprofen (ADVIL,MOTRIN) 800 MG tablet Take 1 tablet (800 mg total) by mouth every 8 (eight) hours as needed. 30 tablet 0  . oxyCODONE (OXY IR/ROXICODONE) 5 MG immediate release tablet Take 1 tablet (5 mg total) by mouth every 4 (four) hours as needed for severe pain. 10 tablet 0  . Prenatal Vit-Fe Fumarate-FA (PNV PRENATAL PLUS MULTIVITAMIN) 27-1 MG TABS Take 1 tablet by mouth daily. 30 tablet 11  . senna-docusate (SENOKOT-S) 8.6-50 MG tablet Take 2 tablets by mouth daily. 30 tablet 0    Objective: BP (!) 127/96   Pulse 82   Temp 100.2  F (37.9 C) (Oral)   Resp 12   Ht 5' 1"  (1.549 m)   Wt 54.4 kg   LMP 07/04/2018   SpO2 100%   BMI 22.67 kg/m  Exam: Physical Exam Constitutional:      General: She is not in acute distress.    Appearance: Normal appearance. She is normal weight. She is ill-appearing. She is not toxic-appearing or diaphoretic.  HENT:     Head: Normocephalic and atraumatic.     Nose: Nose normal.     Mouth/Throat:     Mouth: Mucous membranes are dry.  Eyes:     Extraocular Movements: Extraocular movements intact.     Conjunctiva/sclera: Conjunctivae normal.  Neck:     Musculoskeletal: Normal range of motion and neck supple.  Cardiovascular:     Rate and Rhythm: Normal rate and regular rhythm.  Pulmonary:     Effort: Pulmonary effort is normal.     Breath sounds: Normal breath sounds.  Abdominal:     General: There is no distension.     Tenderness: There is abdominal tenderness. There is no right CVA tenderness, left CVA tenderness or rebound.     Hernia: No hernia is present.   Musculoskeletal: Normal range of motion.        General: No swelling or tenderness.  Skin:    General: Skin is warm and dry.  Neurological:     General: No focal deficit present.     Mental Status: She is alert and oriented to person, place, and time. Mental status is at baseline.  Psychiatric:        Mood and Affect: Mood normal.        Behavior: Behavior normal.      Labs and Imaging: CBC BMET  Recent Labs  Lab 08/04/18 0858  WBC 14.2*  HGB 14.8  HCT 42.1  PLT 393   Recent Labs  Lab 08/04/18 0858  NA 138  K 3.5  CL 98  CO2 22  BUN 26*  CREATININE 0.94  GLUCOSE 99  CALCIUM 9.5       Bonnita Hollow, MD 08/04/2018, 4:44 PM PGY-2, [place] Intern pager: [phone], text pages welcome

## 2018-08-04 NOTE — Discharge Instructions (Addendum)
Take the antibiotic Keflex 500 mg 4 times daily for 6 days starting 08/05/2018.  Be sure to use alternative forms of birth control for 7 days until the Depo-Provera begins to work.  Take 1 tablet of the potassium pill twice daily for the next 3 days.  Your potassium should normalize as you begin to eat your regular diet.  I did provide some Zofran tablets for nausea as needed.  Do not take if you do not have nausea.  Return to the hospital if you continue to have vomiting and are unable to take the antibiotics stated above.

## 2018-08-04 NOTE — Progress Notes (Signed)
CSW met with patient in ED prior to being admitted to an inpatient floor regarding substance use. CSW noted patient had positive UDS for Cocaine and THC. CSW discussed substance use with patient and noted patient was open about recently using non-prescribed medication to manage pain and processed her history of difficulties with pain medication. CSW noted patient affirmed having been away from it frequently. CSW noted patient denied any illicit stimulant use and was concerned it was an unintentional exposure in her environment due to a home she was recently in. CSW completed SBIRT and noted patient declined resources at this time. CSW is signing off at this time, please re-consult for future CSW needs.  Lamonte Richer, LCSW, [place] [facility] II [phone]

## 2018-08-04 NOTE — ED Notes (Signed)
ED TO INPATIENT HANDOFF REPORT  ED Nurse Name and Phone #: Percell Locus, RN  S Name/Age/Gender Janet Dixon 33 y.o. female Room/Bed: 036C/036C  Code Status   Code Status: Full Code  Home/SNF/Other Home Patient oriented to: x4 Is this baseline? Yes   Triage Complete: Triage complete  Chief Complaint vomiting  Triage Note Pt states she has takes percocet "recreational" and then her stomach began to vomit. Pt states she has pancreatis and has been vomiting since Tuesday off and on.    Allergies Allergies  Allergen Reactions  . Macrobid [Nitrofurantoin Macrocrystal] Shortness Of Breath, Nausea And Vomiting and Other (See Comments)    dizziness  . Diphenhydramine Hcl Other (See Comments)    Unknown-patient states that she does take this medication  . Ibuprofen Other (See Comments)    Patient states that syncope has occurred after taking this medication  . Flexeril [Cyclobenzaprine Hcl] Rash    Level of Care/Admitting Diagnosis ED Disposition    ED Disposition Condition [facility] Area: [place] [[id number]]  Level of Care: Med-Surg [16]  Covid Evaluation: N/A  Diagnosis: Pyelonephritis [323557]  Admitting Physician: Martyn Malay [[id number]]  Attending Physician: Martyn Malay [[id number]]  PT Class (Do Not Modify): Observation [104]  PT Acc Code (Do Not Modify): Observation [10022]       B Medical/Surgery History Past Medical History:  Diagnosis Date  . Asthma   . History of chlamydia   . History of gonorrhea   . History of trichomoniasis   . Seizures ([place])    Past Surgical History:  Procedure Laterality Date  . CESAREAN SECTION    . CESAREAN SECTION N/A 12/02/2014   Procedure: REPEAT CESAREAN SECTION;  Surgeon: Jonnie Kind, MD;  Location: [facility];  Service: Obstetrics;  Laterality: N/A;  . CESAREAN SECTION N/A 12/09/2017   Procedure: CESAREAN SECTION;  Surgeon: Jonnie Kind, MD;  Location: [facility];  Service:  Obstetrics;  Laterality: N/A;     A IV Location/Drains/Wounds Patient Lines/Drains/Airways Status   Active Line/Drains/Airways    Name:   Placement date:   Placement time:   Site:   Days:   Incision (Closed) 12/09/17 Abdomen   12/09/17    1250     238   Incision (Closed) 12/09/17 Vagina   12/09/17    1250     238          Intake/Output Last 24 hours  Intake/Output Summary (Last 24 hours) at 08/04/2018 1704 Last data filed at 08/04/2018 1036 Gross per 24 hour  Intake 2000 ml  Output 25 ml  Net 1975 ml    Labs/Imaging Results for orders placed or performed during the hospital encounter of 08/04/18 (from the past 48 hour(s))  Comprehensive metabolic panel     Status: Abnormal   Collection Time: 08/04/18  8:58 AM  Result Value Ref Range   Sodium 138 135 - 145 mmol/L   Potassium 3.5 3.5 - 5.1 mmol/L   Chloride 98 98 - 111 mmol/L   CO2 22 22 - 32 mmol/L   Glucose, Bld 99 70 - 99 mg/dL   BUN 26 (H) 6 - 20 mg/dL   Creatinine, Ser 0.94 0.44 - 1.00 mg/dL   Calcium 9.5 8.9 - 10.3 mg/dL   Total Protein 8.4 (H) 6.5 - 8.1 g/dL   Albumin 5.0 3.5 - 5.0 g/dL   AST 31 15 - 41 U/L   ALT 27 0 - 44 U/L  Alkaline Phosphatase 70 38 - 126 U/L   Total Bilirubin 1.3 (H) 0.3 - 1.2 mg/dL   GFR calc non Af Amer >60 >60 mL/min   GFR calc Af Amer >60 >60 mL/min   Anion gap 18 (H) 5 - 15    Comment: Performed at [facility] [address], [place], [place] [address]  Lipase, blood     Status: None   Collection Time: 08/04/18  8:58 AM  Result Value Ref Range   Lipase 27 11 - 51 U/L    Comment: Performed at [facility] [address] [place], [place] [address]  CBC with Differential     Status: Abnormal   Collection Time: 08/04/18  8:58 AM  Result Value Ref Range   WBC 14.2 (H) 4.0 - 10.5 K/uL   RBC 4.59 3.87 - 5.11 MIL/uL   Hemoglobin 14.8 12.0 - 15.0 g/dL   HCT 42.1 36.0 - 46.0 %   MCV 91.7 80.0 - 100.0 fL   MCH 32.2 26.0 - 34.0 pg   MCHC 35.2 30.0 - 36.0 g/dL   RDW  13.1 11.5 - 15.5 %   Platelets 393 150 - 400 K/uL   nRBC 0.0 0.0 - 0.2 %   Neutrophils Relative % 71 %   Neutro Abs 10.1 (H) 1.7 - 7.7 K/uL   Lymphocytes Relative 21 %   Lymphs Abs 2.9 0.7 - 4.0 K/uL   Monocytes Relative 8 %   Monocytes Absolute 1.2 (H) 0.1 - 1.0 K/uL   Eosinophils Relative 0 %   Eosinophils Absolute 0.0 0.0 - 0.5 K/uL   Basophils Relative 0 %   Basophils Absolute 0.0 0.0 - 0.1 K/uL   Immature Granulocytes 0 %   Abs Immature Granulocytes 0.03 0.00 - 0.07 K/uL    Comment: Performed at [facility] [address] [place], [place] [address]  Ethanol     Status: None   Collection Time: 08/04/18  8:59 AM  Result Value Ref Range   Alcohol, Ethyl (B) <10 <10 mg/dL    Comment: (NOTE) Lowest detectable limit for serum alcohol is 10 mg/dL. For medical purposes only. Performed at [facility], [place] [address] [place], [place] [address]   Urine rapid drug screen (hosp performed)     Status: Abnormal   Collection Time: 08/04/18  9:17 AM  Result Value Ref Range   Opiates NONE DETECTED NONE DETECTED   Cocaine POSITIVE (A) NONE DETECTED   Benzodiazepines NONE DETECTED NONE DETECTED   Amphetamines NONE DETECTED NONE DETECTED   Tetrahydrocannabinol POSITIVE (A) NONE DETECTED   Barbiturates NONE DETECTED NONE DETECTED    Comment: (NOTE) DRUG SCREEN FOR MEDICAL PURPOSES ONLY.  IF CONFIRMATION IS NEEDED FOR ANY PURPOSE, NOTIFY LAB WITHIN 5 DAYS. LOWEST DETECTABLE LIMITS FOR URINE DRUG SCREEN Drug Class                     Cutoff (ng/mL) Amphetamine and metabolites    1000 Barbiturate and metabolites    200 Benzodiazepine                 614 Tricyclics and metabolites     300 Opiates and metabolites        300 Cocaine and metabolites        300 THC                            50  Performed at [facility], [place] [address] [place], [place] [address]   Pregnancy, urine     Status: None   Collection Time: 08/04/18  9:17 AM  Result Value Ref Range    Preg Test, Ur NEGATIVE NEGATIVE    Comment:        THE SENSITIVITY OF THIS METHODOLOGY IS >20 mIU/mL. Performed at [facility], [place] [address], [place], [place] [address]   Urinalysis, Routine w reflex microscopic     Status: Abnormal   Collection Time: 08/04/18  9:17 AM  Result Value Ref Range   Color, Urine AMBER (A) YELLOW    Comment: BIOCHEMICALS MAY BE AFFECTED BY COLOR   APPearance CLOUDY (A) CLEAR   Specific Gravity, Urine 1.039 (H) 1.005 - 1.030   pH 5.0 5.0 - 8.0   Glucose, UA NEGATIVE NEGATIVE mg/dL   Hgb urine dipstick LARGE (A) NEGATIVE   Bilirubin Urine NEGATIVE NEGATIVE   Ketones, ur 80 (A) NEGATIVE mg/dL   Protein, ur 100 (A) NEGATIVE mg/dL   Nitrite NEGATIVE NEGATIVE   Leukocytes,Ua MODERATE (A) NEGATIVE   RBC / HPF 6-10 0 - 5 RBC/hpf   WBC, UA >50 (H) 0 - 5 WBC/hpf   Bacteria, UA MANY (A) NONE SEEN   Squamous Epithelial / LPF 0-5 0 - 5   Mucus PRESENT    Hyaline Casts, UA PRESENT    Non Squamous Epithelial 0-5 (A) NONE SEEN    Comment: Performed at [facility], [place] [address] [place], [place] [address]   Ct Abdomen Pelvis W Contrast  Result Date: 08/04/2018 CLINICAL DATA:  Abdominal pain and vomiting for 5 days. Recreational drug use. Patient reports pancreatitis. EXAM: CT ABDOMEN AND PELVIS WITH CONTRAST TECHNIQUE: Multidetector CT imaging of the abdomen and pelvis was performed using the standard protocol following bolus administration of intravenous contrast. CONTRAST:  136m OMNIPAQUE IOHEXOL 300 MG/ML  SOLN COMPARISON:  Abdominopelvic CT 12/26/2016 FINDINGS: Image quality degraded by the patient's arms at her side. Lower chest: Clear lung bases. There is no significant pleural or pericardial effusion. Small hiatal hernia and mild distal esophageal wall thickening, similar to previous study. Hepatobiliary: The liver is normal in density without suspicious focal abnormality. No evidence of gallstones, gallbladder wall thickening or biliary  dilatation. Pancreas: Unremarkable. No pancreatic ductal dilatation or surrounding inflammatory changes. Spleen: Normal in size without focal abnormality. Adrenals/Urinary Tract: Both adrenal glands appear normal. The right kidney has a stable appearance with a probable small cyst in the interpolar region, mild cortical scarring and a nonobstructing calculus in the lower pole. The left kidney appears normal. No evidence of hydronephrosis. The bladder appears normal. Stomach/Bowel: No evidence of bowel wall thickening, distention or surrounding inflammatory change. Retrocecal appendix appears normal. Vascular/Lymphatic: There are no enlarged abdominal or pelvic lymph nodes. No significant vascular findings. Stable mild dilatation of the gonadal veins. Reproductive: Stable appearance of the uterus with retroversion and retroflexion. No suspicious adnexal findings. Small amount of free pelvic fluid. Other: No evidence of abdominal wall mass or hernia. No ascites. Musculoskeletal: No acute or significant osseous findings. IMPRESSION: 1. No acute abdominal findings or explanation for the patient's symptoms. The pancreas and appendix appear normal. 2. Nonobstructing small right renal calculus and mild right renal cortical scarring. 3. Mild distal esophageal wall thickening, similar to previous study. Electronically Signed   By: WRichardean SaleM.D.   On: 08/04/2018 14:11    Pending Labs Unresulted Labs (From admission, onward)  Start     Ordered   08/11/18 0500  Creatinine, serum  (enoxaparin (LOVENOX)    CrCl >/= 30 ml/min)  Weekly,   R    Comments:  while on enoxaparin therapy    08/04/18 1643   08/05/18 4967  Basic metabolic panel  Tomorrow morning,   R     08/04/18 1643   08/05/18 0500  CBC  Tomorrow morning,   R     08/04/18 1643   08/04/18 1644  CBC  (enoxaparin (LOVENOX)    CrCl >/= 30 ml/min)  Once,   R    Comments:  Baseline for enoxaparin therapy IF NOT ALREADY DRAWN.  Notify MD if PLT < 100  K.    08/04/18 1643   08/04/18 1644  Creatinine, serum  (enoxaparin (LOVENOX)    CrCl >/= 30 ml/min)  Once,   R    Comments:  Baseline for enoxaparin therapy IF NOT ALREADY DRAWN.    08/04/18 1643   08/04/18 1635  Troponin I - Once  Once,   R     08/04/18 1634   08/04/18 1221  Urine culture  ONCE - STAT,   STAT     08/04/18 1220          Vitals/Pain Today's Vitals   08/04/18 1500 08/04/18 1515 08/04/18 1538 08/04/18 1630  BP: 115/76 (!) 127/96  130/81  Pulse: 81 82  75  Resp: (!) 23 12  15   Temp:   100.2 F (37.9 C)   TempSrc:   Oral   SpO2: 99% 100%  100%  Weight:      Height:      PainSc:        Isolation Precautions No active isolations  Medications Medications  enoxaparin (LOVENOX) injection 40 mg (has no administration in time range)  0.9 %  sodium chloride infusion (has no administration in time range)  acetaminophen (TYLENOL) tablet 650 mg (has no administration in time range)    Or  acetaminophen (TYLENOL) suppository 650 mg (has no administration in time range)  ketorolac (TORADOL) 30 MG/ML injection 30 mg (has no administration in time range)  prochlorperazine (COMPAZINE) injection 10 mg (has no administration in time range)  ondansetron (ZOFRAN) injection 4 mg (has no administration in time range)  cefTRIAXone (ROCEPHIN) 1 g in sodium chloride 0.9 % 100 mL IVPB (has no administration in time range)  sodium chloride 0.9 % bolus 2,000 mL (0 mLs Intravenous Stopped 08/04/18 1036)  promethazine (PHENERGAN) injection 25 mg (25 mg Intravenous Given 08/04/18 0904)  cefTRIAXone (ROCEPHIN) 1 g in sodium chloride 0.9 % 100 mL IVPB (0 g Intravenous Stopped 08/04/18 1313)  metoCLOPramide (REGLAN) injection 10 mg (10 mg Intravenous Given 08/04/18 1143)  iohexol (OMNIPAQUE) 300 MG/ML solution 100 mL (100 mLs Intravenous Contrast Given 08/04/18 1317)  ondansetron (ZOFRAN-ODT) disintegrating tablet 4 mg (4 mg Oral Given 08/04/18 1546)    Mobility walks Low fall risk    Focused Assessments Neuro Assessment Handoff:  Swallow screen pass? Yes          Neuro Assessment:   Neuro Checks:      Last Documented NIHSS Modified Score:   Has TPA been given? No If patient is a Neuro Trauma and patient is going to OR before floor call report to [facility] nurse: [phone] or [phone]     R Recommendations: See Admitting Provider Note  Report given to:   Additional Notes:

## 2018-08-04 NOTE — ED Notes (Signed)
Received report from Liz, RN.

## 2018-08-04 NOTE — ED Notes (Signed)
Pt drifting in and out of sleep. Having to arouse patient for any type of response. Bedside commode used with one person assist. Pt seems unsteady on feet. Pt requested dilaudid for pain

## 2018-08-04 NOTE — ED Notes (Signed)
ED Provider at bedside. 

## 2018-08-04 NOTE — ED Notes (Signed)
Pt returned to room from CT

## 2018-08-04 NOTE — ED Provider Notes (Signed)
[facility] EMERGENCY DEPARTMENT Provider Note   CSN: [id number] Arrival date & time: 08/04/18  4166    History   Chief Complaint Chief Complaint  Patient presents with   Emesis    HPI Janet Dixon is a 33 y.o. female presents for diffuse abdominal pain x 5 days. She has associated nausea and vomiting.  She is unable to remember how many times she has vomited in the last 24 hours.  She describes the pain as sharp and radiates throughout entire abdomen.  She rates the pain x10 in severity.  She is also admitting to right flank pain.  Patient reports a history of pancreatitis.  She has been taking Percocet recreationally for pain and it has not been helping.  Patient also reports dysuria x 1 week.  Denies any fever, pelvic pain, vaginal pain, vaginal discharge, diarrhea. Pt denies known contact with lab confirmed positive covid-19.  Surgical history includes C-sections.  History provided by patient with additional history obtained from chart review.     Past Medical History:  Diagnosis Date   Asthma    History of chlamydia    History of gonorrhea    History of trichomoniasis    Seizures ([place])     Patient Active Problem List   Diagnosis Date Noted   Pyelonephritis 08/04/2018   Status post repeat low transverse cesarean section 12/09/2017   Drug use affecting pregnancy 06/08/2017   Elevated lipase 04/14/2017   Stomach pain 04/14/2017   History of C-section 04/14/2017   Hematuria 12/07/2016   History of chlamydia 12/07/2016   Papanicolaou smear of cervix with positive high risk human papilloma virus (HPV) test 09/07/2016   Trichimoniasis 08/29/2016   Seizure disorder ([place]) 05/01/2014   Cocaine abuse ([place]) 03/14/2014    Past Surgical History:  Procedure Laterality Date   CESAREAN SECTION     CESAREAN SECTION N/A 12/02/2014   Procedure: REPEAT CESAREAN SECTION;  Surgeon: Jonnie Kind, MD;  Location: [facility];  Service: Obstetrics;   Laterality: N/A;   CESAREAN SECTION N/A 12/09/2017   Procedure: CESAREAN SECTION;  Surgeon: Jonnie Kind, MD;  Location: [place];  Service: Obstetrics;  Laterality: N/A;     OB History    Gravida  7   Para  7   Term  7   Preterm      AB  0   Living  7     SAB  0   TAB      Ectopic      Multiple  0   Live Births  7            Home Medications    Prior to Admission medications   Medication Sig Start Date End Date Taking? Authorizing Provider  albuterol (PROVENTIL HFA;VENTOLIN HFA) 108 (90 Base) MCG/ACT inhaler Inhale 2 puffs into the lungs every 6 (six) hours as needed for wheezing or shortness of breath. 08/29/16   Estill Dooms, NP  ibuprofen (ADVIL,MOTRIN) 800 MG tablet Take 1 tablet (800 mg total) by mouth every 8 (eight) hours as needed. Patient not taking: Reported on 08/04/2018 12/12/17   Nicolette Bang, DO  oxyCODONE (OXY IR/ROXICODONE) 5 MG immediate release tablet Take 1 tablet (5 mg total) by mouth every 4 (four) hours as needed for severe pain. Patient not taking: Reported on 08/04/2018 12/12/17   Nicolette Bang, DO  Prenatal Vit-Fe Fumarate-FA (PNV PRENATAL PLUS MULTIVITAMIN) 27-1 MG TABS Take 1 tablet by mouth daily. Patient  not taking: Reported on 08/04/2018 08/30/17   Cresenzo-Dishmon, Joaquim Lai, CNM  senna-docusate (SENOKOT-S) 8.6-50 MG tablet Take 2 tablets by mouth daily. Patient not taking: Reported on 08/04/2018 12/13/17   Nicolette Bang, DO    Family History Family History  Problem Relation Age of Onset   Hypertension Mother    Asthma Brother    Asthma Daughter    Eczema Daughter    Asthma Son    Eczema Son    Aneurysm Maternal Grandmother    Heart disease Maternal Grandmother    Cancer Maternal Grandmother    Asthma Daughter    Eczema Daughter    Asthma Son    Eczema Son    Eczema Son    Cancer Maternal Grandfather        colon   Eczema Maternal Aunt     Social  History Social History   Tobacco Use   Smoking status: Current Every Day Smoker    Packs/day: 0.50    Years: 13.00    Pack years: 6.50    Types: Cigarettes   Smokeless tobacco: Never Used   Tobacco comment: 3 per day  Substance Use Topics   Alcohol use: No    Comment: none x 1 month ago   Drug use: Yes    Types: Marijuana, Cocaine    Comment: states its been almost a year     Allergies   Macrobid [nitrofurantoin macrocrystal]; Diphenhydramine hcl; Ibuprofen; and Flexeril [cyclobenzaprine hcl]   Review of Systems Review of Systems  Constitutional: Negative for chills and fever.  HENT: Negative for congestion, ear discharge, ear pain, sinus pressure, sinus pain and sore throat.   Eyes: Negative for pain and redness.  Respiratory: Negative for cough and shortness of breath.   Cardiovascular: Negative for chest pain.  Gastrointestinal: Positive for abdominal pain, nausea and vomiting. Negative for constipation and diarrhea.  Genitourinary: Positive for dysuria and flank pain. Negative for hematuria.  Musculoskeletal: Negative for back pain and neck pain.  Skin: Negative for wound.  Neurological: Negative for weakness, numbness and headaches.     Physical Exam Updated Vital Signs BP (!) 127/96    Pulse 82    Temp 100.2 F (37.9 C) (Oral)    Resp 12    Ht 5' 1"  (1.549 m)    Wt 54.4 kg    LMP 07/04/2018    SpO2 100%    BMI 22.67 kg/m   Physical Exam Vitals signs and nursing note reviewed.  Constitutional:      General: She is not in acute distress.    Appearance: She is not diaphoretic.     Comments: Uncomfortable appearing while laying on stretcher, she is curled up in the fetal position.  HENT:     Head: Normocephalic and atraumatic.     Mouth/Throat:     Mouth: Mucous membranes are dry.  Eyes:     General: No scleral icterus.       Right eye: No discharge.        Left eye: No discharge.     Conjunctiva/sclera: Conjunctivae normal.  Neck:      Musculoskeletal: Normal range of motion. No muscular tenderness.  Cardiovascular:     Rate and Rhythm: Normal rate and regular rhythm.     Pulses: Normal pulses.          Radial pulses are 2+ on the right side and 2+ on the left side.     Heart sounds: Normal heart sounds.  Pulmonary:  Effort: Pulmonary effort is normal.     Breath sounds: Normal breath sounds.  Abdominal:     General: Bowel sounds are normal. There is no distension.     Palpations: Abdomen is soft.     Tenderness: There is right CVA tenderness. There is no left CVA tenderness.     Comments: Diffusely tender on exam without rebound or guarding.  No peritoneal signs.  Musculoskeletal: Normal range of motion.     Right lower leg: No edema.     Left lower leg: No edema.  Skin:    General: Skin is warm and dry.     Capillary Refill: Capillary refill takes less than 2 seconds.  Neurological:     Mental Status: She is alert and oriented to person, place, and time.  Psychiatric:        Behavior: Behavior normal.      ED Treatments / Results  Labs (all labs ordered are listed, but only abnormal results are displayed) Labs Reviewed  COMPREHENSIVE METABOLIC PANEL - Abnormal; Notable for the following components:      Result Value   BUN 26 (*)    Total Protein 8.4 (*)    Total Bilirubin 1.3 (*)    Anion gap 18 (*)    All other components within normal limits  CBC WITH DIFFERENTIAL/PLATELET - Abnormal; Notable for the following components:   WBC 14.2 (*)    Neutro Abs 10.1 (*)    Monocytes Absolute 1.2 (*)    All other components within normal limits  RAPID URINE DRUG SCREEN, HOSP PERFORMED - Abnormal; Notable for the following components:   Cocaine POSITIVE (*)    Tetrahydrocannabinol POSITIVE (*)    All other components within normal limits  URINALYSIS, ROUTINE W REFLEX MICROSCOPIC - Abnormal; Notable for the following components:   Color, Urine AMBER (*)    APPearance CLOUDY (*)    Specific Gravity, Urine  1.039 (*)    Hgb urine dipstick LARGE (*)    Ketones, ur 80 (*)    Protein, ur 100 (*)    Leukocytes,Ua MODERATE (*)    WBC, UA >50 (*)    Bacteria, UA MANY (*)    Non Squamous Epithelial 0-5 (*)    All other components within normal limits  CBC - Abnormal; Notable for the following components:   WBC 13.6 (*)    All other components within normal limits  URINE CULTURE  LIPASE, BLOOD  ETHANOL  PREGNANCY, URINE  TROPONIN I  CREATININE, SERUM  BASIC METABOLIC PANEL  CBC    EKG None  Radiology Ct Abdomen Pelvis W Contrast  Result Date: 08/04/2018 CLINICAL DATA:  Abdominal pain and vomiting for 5 days. Recreational drug use. Patient reports pancreatitis. EXAM: CT ABDOMEN AND PELVIS WITH CONTRAST TECHNIQUE: Multidetector CT imaging of the abdomen and pelvis was performed using the standard protocol following bolus administration of intravenous contrast. CONTRAST:  178m OMNIPAQUE IOHEXOL 300 MG/ML  SOLN COMPARISON:  Abdominopelvic CT 12/26/2016 FINDINGS: Image quality degraded by the patient's arms at her side. Lower chest: Clear lung bases. There is no significant pleural or pericardial effusion. Small hiatal hernia and mild distal esophageal wall thickening, similar to previous study. Hepatobiliary: The liver is normal in density without suspicious focal abnormality. No evidence of gallstones, gallbladder wall thickening or biliary dilatation. Pancreas: Unremarkable. No pancreatic ductal dilatation or surrounding inflammatory changes. Spleen: Normal in size without focal abnormality. Adrenals/Urinary Tract: Both adrenal glands appear normal. The right kidney has a stable appearance  with a probable small cyst in the interpolar region, mild cortical scarring and a nonobstructing calculus in the lower pole. The left kidney appears normal. No evidence of hydronephrosis. The bladder appears normal. Stomach/Bowel: No evidence of bowel wall thickening, distention or surrounding inflammatory change.  Retrocecal appendix appears normal. Vascular/Lymphatic: There are no enlarged abdominal or pelvic lymph nodes. No significant vascular findings. Stable mild dilatation of the gonadal veins. Reproductive: Stable appearance of the uterus with retroversion and retroflexion. No suspicious adnexal findings. Small amount of free pelvic fluid. Other: No evidence of abdominal wall mass or hernia. No ascites. Musculoskeletal: No acute or significant osseous findings. IMPRESSION: 1. No acute abdominal findings or explanation for the patient's symptoms. The pancreas and appendix appear normal. 2. Nonobstructing small right renal calculus and mild right renal cortical scarring. 3. Mild distal esophageal wall thickening, similar to previous study. Electronically Signed   By: Richardean Sale M.D.   On: 08/04/2018 14:11    Procedures Procedures (including critical care time)  Medications Ordered in ED Medications  enoxaparin (LOVENOX) injection 40 mg (has no administration in time range)  0.9 %  sodium chloride infusion (has no administration in time range)  acetaminophen (TYLENOL) tablet 650 mg (has no administration in time range)    Or  acetaminophen (TYLENOL) suppository 650 mg (has no administration in time range)  ketorolac (TORADOL) 30 MG/ML injection 30 mg (has no administration in time range)  prochlorperazine (COMPAZINE) injection 10 mg (has no administration in time range)  ondansetron (ZOFRAN) injection 4 mg (has no administration in time range)  cefTRIAXone (ROCEPHIN) 1 g in sodium chloride 0.9 % 100 mL IVPB (has no administration in time range)  sodium chloride 0.9 % bolus 2,000 mL (0 mLs Intravenous Stopped 08/04/18 1036)  promethazine (PHENERGAN) injection 25 mg (25 mg Intravenous Given 08/04/18 0904)  cefTRIAXone (ROCEPHIN) 1 g in sodium chloride 0.9 % 100 mL IVPB (0 g Intravenous Stopped 08/04/18 1313)  metoCLOPramide (REGLAN) injection 10 mg (10 mg Intravenous Given 08/04/18 1143)  iohexol  (OMNIPAQUE) 300 MG/ML solution 100 mL (100 mLs Intravenous Contrast Given 08/04/18 1317)  ondansetron (ZOFRAN-ODT) disintegrating tablet 4 mg (4 mg Oral Given 08/04/18 1546)     Initial Impression / Assessment and Plan / ED Course  I have reviewed the triage vital signs and the nursing notes.  Pertinent labs & imaging results that were available during my care of the patient were reviewed by me and considered in my medical decision making (see chart for details).  33 year old female with diffuse abdominal pain and nausea and vomiting x5 days.  On arrival patient is afebrile, non-tachycardic actively vomiting in triage.  DDX includes pancreatitis, gastritis, UTI, pyelonephritis, appendicitis, cholecystitis, pregnancy. Lab work shows leukocytosis of 14.2 with a neutrophil count of 10.1 suggestive of possible infectious processes.  UA shows urinary tract infection vs pylonephritis with moderate leukocytes, over 50 white blood cells,  many bacteria, urine culture sent. IV Rocephin started.  Urine pregnancy test is negative.  CT abdomen pelvis viewed by me shows a normal appendix and pancreas.  There is a nonobstructing right kidney stone.  UDS is positive for cocaine and tetrahydrocannabinol. On reasseessment patient is febrile at 100.2, she is unable to tolerate p.o. intake after receiving Phenergan, Reglan and Zofran. Patient continues to be diffusely tender in the abdomen as well as over right flank.  Plan was to discharge patient home on Keflex however given her lack of improvement will speak to hospitalist for admission.  Discussed  case with Dr. Grandville Silos with family medicine teaching service who agrees to assume care of patient and bring into the hospital for further evaluation and management.  This note was prepared with assistance of Systems analyst. Occasional wrong-word or sound-a-like substitutions may have occurred due to the inherent limitations of voice recognition  software.   Final Clinical Impressions(s) / ED Diagnoses   Final diagnoses:  Abdominal pain, unspecified abdominal location  Urinary tract infection without hematuria, site unspecified  Intractable vomiting with nausea, unspecified vomiting type    ED Discharge Orders         Ordered    cephALEXin (KEFLEX) 500 MG capsule  4 times daily,   Status:  Discontinued     08/04/18 [place], [place] E, PA-C 08/04/18 1853    Malvin Johns, MD 08/06/18 1501

## 2018-08-04 NOTE — ED Notes (Signed)
Pt pulled out IV access. Pt is now actively vomiting

## 2018-08-04 NOTE — Progress Notes (Signed)
New Admission Note:   Arrival Method:  Bed  Mental Orientation: Alert and oriented  Telemetry: none  Assessment: Completed Skin: intact  IV: Pain:9/10 abdomen Tubes: none  Safety Measures: Safety Fall Prevention Plan has been given, discussed and signed Admission: Completed [facility] Orientation: Patient has been orientated to the room, unit and staff.  Family: none   Orders have been reviewed and implemented. Will continue to monitor the patient. Call light has been placed within reach and bed alarm has been activated.   Abubakr Wieman RN [facility] Phone: [phone]

## 2018-08-04 NOTE — ED Notes (Signed)
Pt c/o nausea  Pa notified

## 2018-08-05 DIAGNOSIS — N390 Urinary tract infection, site not specified: Secondary | ICD-10-CM

## 2018-08-05 DIAGNOSIS — R109 Unspecified abdominal pain: Secondary | ICD-10-CM

## 2018-08-05 DIAGNOSIS — R112 Nausea with vomiting, unspecified: Secondary | ICD-10-CM | POA: Diagnosis not present

## 2018-08-05 DIAGNOSIS — R1110 Vomiting, unspecified: Secondary | ICD-10-CM

## 2018-08-05 DIAGNOSIS — N12 Tubulo-interstitial nephritis, not specified as acute or chronic: Secondary | ICD-10-CM | POA: Diagnosis not present

## 2018-08-05 LAB — BASIC METABOLIC PANEL
Anion gap: 9 (ref 5–15)
BUN: 9 mg/dL (ref 6–20)
CO2: 24 mmol/L (ref 22–32)
Calcium: 8.4 mg/dL — ABNORMAL LOW (ref 8.9–10.3)
Chloride: 106 mmol/L (ref 98–111)
Creatinine, Ser: 0.62 mg/dL (ref 0.44–1.00)
GFR calc Af Amer: >60 mL/min (ref >60)
GFR calc non Af Amer: >60 mL/min (ref >60)
Glucose, Bld: 101 mg/dL — ABNORMAL HIGH (ref 70–99)
Potassium: 2.9 mmol/L — ABNORMAL LOW (ref 3.5–5.1)
Sodium: 139 mmol/L (ref 135–145)

## 2018-08-05 LAB — CBC
HCT: 35.5 % — ABNORMAL LOW (ref 36.0–46.0)
Hemoglobin: 12.1 g/dL (ref 12.0–15.0)
MCH: 32.2 pg (ref 26.0–34.0)
MCHC: 34.1 g/dL (ref 30.0–36.0)
MCV: 94.4 fL (ref 80.0–100.0)
Platelets: 307 10*3/uL (ref 150–400)
RBC: 3.76 MIL/uL — ABNORMAL LOW (ref 3.87–5.11)
RDW: 13.2 % (ref 11.5–15.5)
WBC: 9.8 10*3/uL (ref 4.0–10.5)
nRBC: 0 % (ref 0.0–0.2)

## 2018-08-05 MED ORDER — CEPHALEXIN 500 MG PO CAPS: 500.0000 mg | ORAL_CAPSULE | Freq: Four times a day (QID) | ORAL | 0 refills | Status: AC

## 2018-08-05 MED ORDER — POTASSIUM CHLORIDE ER 10 MEQ PO TBCR: 10.0000 meq | EXTENDED_RELEASE_TABLET | Freq: Two times a day (BID) | ORAL | 0 refills | Status: DC

## 2018-08-05 MED ORDER — CEPHALEXIN 500 MG PO CAPS
500.0000 mg | ORAL_CAPSULE | Freq: Four times a day (QID) | ORAL | Status: DC
  Administered 2018-08-05: 500 mg via ORAL
  Filled 2018-08-05: qty 1

## 2018-08-05 MED ORDER — ONDANSETRON 4 MG PO TBDP: 4.0000 mg | ORAL_TABLET | Freq: Three times a day (TID) | ORAL | 0 refills | Status: DC | PRN

## 2018-08-05 MED ORDER — MEDROXYPROGESTERONE ACETATE 150 MG/ML IM SUSP
150.0000 mg | Freq: Once | INTRAMUSCULAR | Status: AC
  Administered 2018-08-05: 150 mg via INTRAMUSCULAR
  Filled 2018-08-05: qty 1

## 2018-08-05 MED ORDER — AZITHROMYCIN 500 MG PO TABS
1000.0000 mg | ORAL_TABLET | Freq: Once | ORAL | Status: AC
  Administered 2018-08-05: 1000 mg via ORAL
  Filled 2018-08-05: qty 2

## 2018-08-05 NOTE — Progress Notes (Signed)
Family Medicine Teaching Service Daily Progress Note Intern Pager: [phone]  Patient name: Janet Dixon Medical record number: [id number] Date of birth: Aug 25, 1985 Age: 33 y.o. Gender: female  Primary Care Provider: Patient, No Pcp Per Consultants: None Code Status: Full  Pt Overview and Major Events to Date:  4/25: Admit for abdominal pain and N/V d/t pyelonephritis based on positive UA  4/26: Symptoms resolved, transitioned to Keflex  Assessment and Plan: Janet Dixon is a 33 y.o. female presenting with 5 days of abdominal pain nausea and vomiting. PMH is significant for  has a past medical history of Asthma, substance use disorder, history of chlamydia, History of gonorrhea, History of trichomoniasis, and Seizures ([place]).  1.  Abdominal pain and vomiting in the setting of pyelonephritis: Improved.  Currently receiving ceftriaxone while awaiting urine cultures based on infectious UA.  Patient's abdominal pain and vomiting have resolved.  She remains afebrile and hemodynamically stable. - Transitioning to Keflex 500 mg every 6 hours day 2 of 7, will follow-up urine culture - Tylenol 650 mg every 6 hours as needed - Compazine every 6 hours as needed - Obtaining GC/chlamydia  2.  Asthma Chronic.  Stable on room air.  Does not have any controller assisted in medication list. - Follow-up outpatient  3.  Seizure disorder: Listed in problems.  Patient not on any current antiepileptics medication. - Follow-up outpatient   4.  Elevated blood pressure in the setting of substance use disorder: UDS positive cocaine and THC.  Patient reports mother using cocaine and feels she is positive from contamination while helping clean her house. - Social work consulted, appreciate recommendations  5.  Contraception: Negative pregnancy test. - Patient to follow-up with PCP  FEN/GI: Regular Diet Prophylaxis: Lovenox  Disposition: Plan for d/c home 4/26  Subjective:  Patient says  abdominal pain has resolved and denies dysuria or nausea. Wants to go home.  Objective: Temp:  [98.8 F (37.1 C)-100.2 F (37.9 C)] 98.9 F (37.2 C) (04/26 0459) Pulse Rate:  [59-86] 62 (04/26 0502) Resp:  [12-31] 19 (04/26 0459) BP: (105-152)/(66-105) 142/91 (04/26 0459) SpO2:  [99 %-100 %] 100 % (04/26 0502) Weight:  [52.6 kg-54.4 kg] 52.6 kg (04/25 2133) Physical Exam: General: well nourished, well developed, NAD with non-toxic appearance HEENT: normocephalic, atraumatic, moist mucous membranes Cardiovascular: regular rate and rhythm without murmurs, rubs, or gallops Lungs: clear to auscultation bilaterally with normal work of breathing Abdomen: soft, non-tender, non-distended, normoactive bowel sounds GU: no suprapubic tenderness Skin: warm, dry, no rashes or lesions Extremities: warm and well perfused, normal tone, no edema  Laboratory: Recent Labs  Lab 08/04/18 0858 08/04/18 1824 08/05/18 0555  WBC 14.2* 13.6* 9.8  HGB 14.8 13.0 12.1  HCT 42.1 37.1 35.5*  PLT 393 330 307   Recent Labs  Lab 08/04/18 0858 08/04/18 1824 08/05/18 0555  NA 138  --  139  K 3.5  --  2.9*  CL 98  --  106  CO2 22  --  24  BUN 26*  --  9  CREATININE 0.94 0.67 0.62  CALCIUM 9.5  --  8.4*  PROT 8.4*  --   --   BILITOT 1.3*  --   --   ALKPHOS 70  --   --   ALT 27  --   --   AST 31  --   --   GLUCOSE 99  --  101*   Urinalysis    Component Value Date/Time   COLORURINE AMBER (A)  08/04/2018 0917   APPEARANCEUR CLOUDY (A) 08/04/2018 0917   APPEARANCEUR Turbid (A) 12/07/2016 1345   LABSPEC 1.039 (H) 08/04/2018 0917   LABSPEC 1.011 08/14/2012 1242   PHURINE 5.0 08/04/2018 0917   GLUCOSEU NEGATIVE 08/04/2018 0917   GLUCOSEU Negative 08/14/2012 1242   HGBUR LARGE (A) 08/04/2018 0917   BILIRUBINUR NEGATIVE 08/04/2018 0917   BILIRUBINUR Negative 12/07/2016 1345   BILIRUBINUR Negative 08/14/2012 1242   KETONESUR 80 (A) 08/04/2018 0917   PROTEINUR 100 (A) 08/04/2018 0917    UROBILINOGEN 0.2 11/20/2014 1445   NITRITE NEGATIVE 08/04/2018 0917   LEUKOCYTESUR MODERATE (A) 08/04/2018 0917   LEUKOCYTESUR Trace 08/14/2012 1242   GC/chlamydia: Pending Troponin I: Negative Lipase: 27 Urine culture: Pending Urine pregnancy: Negative UDS: Positive for cocaine and THC Ethanol: Negative  Imaging/Diagnostic Tests: CT ABDOMEN AND PELVIS WITH CONTRAST (08/04/2018) 1. No acute abdominal findings or explanation for the patient's symptoms. The pancreas and appendix appear normal. 2. Nonobstructing small right renal calculus and mild right renal cortical scarring. 3. Mild distal esophageal wall thickening, similar to previous study.     Bing, DO 08/05/2018, 8:12 AM PGY-3, [place] Intern pager: [phone], text pages welcome

## 2018-08-05 NOTE — Care Management (Signed)
PCP through [facility] is: Primary Care Provider: [facility]  Address: [address] [place],  [address]  Contact: [place]  Telephone: [phone]  Telephone: [phone]  Contact: [place]  Work Phone: [phone]   Patient would need to call Medicaid and change PCP if she wishes to go to another clinic.

## 2018-08-05 NOTE — Progress Notes (Signed)
DISCHARGE NOTE [facility] to be discharged Home per MD order. Discussed prescriptions and follow up appointments with the patient. Prescriptions given to patient; medication list explained in detail. Patient verbalized understanding.  Skin clean, dry and intact without evidence of skin break down, no evidence of skin tears noted. IV catheter discontinued intact. Site without signs and symptoms of complications. Dressing and pressure applied. Pt denies pain at the site currently. No complaints noted.  Patient free of lines, drains, and wounds.   An After Visit Summary (AVS) was printed and given to the patient. Patient escorted via wheelchair, and discharged home via private auto.  Arlyss Repress, RN

## 2018-08-05 NOTE — Discharge Summary (Signed)
[facility] Discharge Summary  Patient name: Janet Dixon Medical record number: [id number] Date of birth: November 03, 1985 Age: 33 y.o. Gender: female Date of Admission: 08/04/2018  Date of Discharge: 08/05/2018 Admitting Physician: Martyn Malay, MD  Primary Care Provider: Patient, No Pcp Per Consultants: None  Indication for Hospitalization: Abdominal pain with vomiting  Discharge Diagnoses/Problem List:  Pyelonephritis Asthma Seizure disorder Polysubstance use disorder including cocaine and marijuana Contraceptive management  Disposition: home  Discharge Condition: stable, improved  Discharge Exam:  General: well nourished, well developed, NAD with non-toxic appearance HEENT: normocephalic, atraumatic, moist mucous membranes Cardiovascular: regular rate and rhythm without murmurs, rubs, or gallops Lungs: clear to auscultation bilaterally with normal work of breathing Abdomen: soft, non-tender, non-distended, normoactive bowel sounds GU: no suprapubic tenderness Skin: warm, dry, no rashes or lesions Extremities: warm and well perfused, normal tone, no edema  Brief Hospital Course:  Janet Bryant Priceis a 33 y.o.femalepresenting with 5 days of abdominal pain nausea and vomiting. PMH is significant forhas a past medical history of Asthma,substance use disorder, history of chlamydia, History of gonorrhea, History of trichomoniasis, and Seizures (Janet Dixon).  Janet Dixon presented with vague abdominal pain with nausea and vomiting.  UA consistent with infection, urine culture was obtained.  Patient was started on ceftriaxone and eventually transition to Keflex.  CT abdomen and pelvis was obtained without acute findings.  There was a incidental nonobstructing small right renal calculus and mild right renal cortical scarring noted on imaging.  No signs of hydronephrosis.  There was some mild distal esophageal wall thickening similar to a previous study.  Patient's  diet was advanced and symptoms had completely resolved within 24 hours prior to discharge.  Issues for Follow Up:  1. Patient was treated with a single dose of ceftriaxone on admission for pyelonephritis.  There does not seem to be any complications based on CT and she denied any urinary complaints prior to discharge.  Patient discharged with Keflex 500 mg 4 times daily for 6 additional days for a total duration of 7 days.  Urine culture pending on discharge, follow-up to assure sensitivities are met.  Patient was given 5 tablets of Zofran at discharge for nausea if needed. 2. Patient had a mild hypokalemia prior to discharge which is likely due to her vomiting prior to admission.  She proved herself with oral intake and was discharged with potassium 10 mEq twice daily for 3 days.  Consider rechecking this at follow-up. 3. Patient was adequately treated for gonorrhea and chlamydia, however ancillary testing was pending at discharge.  Please follow-up. 4. Patient requested Depo-Provera for birth control at discharge.  This was administered on 08/05/2018.  She was instructed to use alternative forms of birth control for 7 days after discharge.  Significant Procedures: None  Significant Labs and Imaging:  Recent Labs  Lab 08/04/18 0858 08/04/18 1824 08/05/18 0555  WBC 14.2* 13.6* 9.8  HGB 14.8 13.0 12.1  HCT 42.1 37.1 35.5*  PLT 393 330 307   Recent Labs  Lab 08/04/18 0858 08/04/18 1824 08/05/18 0555  NA 138  --  139  K 3.5  --  2.9*  CL 98  --  106  CO2 22  --  24  GLUCOSE 99  --  101*  BUN 26*  --  9  CREATININE 0.94 0.67 0.62  CALCIUM 9.5  --  8.4*  ALKPHOS 70  --   --   AST 31  --   --   ALT  27  --   --   ALBUMIN 5.0  --   --    GC/chlamydia: Pending Troponin I: Negative Lipase: 27 Urine culture: Pending Urine pregnancy: Negative UDS: Positive for cocaine and THC Ethanol: Negative  Imaging/Diagnostic Tests: CT ABDOMEN AND PELVIS WITH CONTRAST (08/04/2018) 1. No acute  abdominal findings or explanation for the patient's symptoms. The pancreas and appendix appear normal. 2. Nonobstructing small right renal calculus and mild right renal cortical scarring. 3. Mild distal esophageal wall thickening, similar to previous study.  Results/Tests Pending at Time of Discharge: Urine culture, GC/chlamydia urine cytology  Discharge Medications:  Allergies as of 08/05/2018      Reactions   Macrobid [nitrofurantoin Macrocrystal] Shortness Of Breath, Nausea And Vomiting, Other (See Comments)   dizziness   Diphenhydramine Hcl Other (See Comments)   Unknown-patient states that she does take this medication   Ibuprofen Other (See Comments)   Patient states that syncope has occurred after taking this medication   Flexeril [cyclobenzaprine Hcl] Rash      Medication List    STOP taking these medications   ibuprofen 800 MG tablet Commonly known as:  ADVIL   oxyCODONE 5 MG immediate release tablet Commonly known as:  Oxy IR/ROXICODONE   senna-docusate 8.6-50 MG tablet Commonly known as:  Senokot-S     TAKE these medications   albuterol 108 (90 Base) MCG/ACT inhaler Commonly known as:  VENTOLIN HFA Inhale 2 puffs into the lungs every 6 (six) hours as needed for wheezing or shortness of breath.   cephALEXin 500 MG capsule Commonly known as:  KEFLEX Take 1 capsule (500 mg total) by mouth 4 (four) times daily for 6 days.   ondansetron 4 MG disintegrating tablet Commonly known as:  Zofran ODT Take 1 tablet (4 mg total) by mouth every 8 (eight) hours as needed for nausea or vomiting.   PNV Prenatal Plus Multivitamin 27-1 MG Tabs Take 1 tablet by mouth daily.   potassium chloride 10 MEQ tablet Commonly known as:  K-DUR Take 1 tablet (10 mEq total) by mouth 2 (two) times daily for 3 days.       Discharge Instructions: Please refer to Patient Instructions section of EMR for full details.  Patient was counseled important signs and symptoms that should prompt  return to medical care, changes in medications, dietary instructions, activity restrictions, and follow up appointments.   Follow-Up Appointments: Follow-up Information    [facility]. Schedule an appointment as soon as possible for a visit.   Why:  Your assigned PCP through [facility] is [facility]. If you wish to change clinics you mst call Medicaid to get new assigned clinic.  Contact information: [place] [phone] B[place]y, [place], DO 08/05/2018, 11:54 AM PGY-3, [place]

## 2018-08-06 LAB — URINE CULTURE: Culture: >=100000 — AB

## 2018-08-06 LAB — URINE CYTOLOGY ANCILLARY ONLY
Chlamydia: NEGATIVE
Neisseria Gonorrhea: NEGATIVE

## 2018-08-08 ENCOUNTER — Telehealth: Payer: Self-pay | Admitting: Obstetrics & Gynecology

## 2018-08-08 MED ORDER — ONDANSETRON 4 MG PO TBDP: 4.0000 mg | ORAL_TABLET | Freq: Three times a day (TID) | ORAL | 0 refills | Status: DC | PRN

## 2018-08-08 NOTE — Addendum Note (Signed)
Addended by: Roma Schanz on: 08/08/2018 03:15 PM   Modules accepted: Orders

## 2018-08-08 NOTE — Telephone Encounter (Signed)
Please call pt she needs zofran called in she said she has been in hospital and has Pylo and needs a call by 3. I advised pt that that was only 45 mins and I would send message to nurse. I advised pt that we were not fully staffed today due to low census and we were only seeing ob pt and emergency due to covid 19. SHe said this was an emergency and she needed her meds.

## 2018-08-08 NOTE — Telephone Encounter (Signed)
Pt recently discharge with pylo. Was given 5 pills of zofran. She is still nauseated. Would like refill on zofran sent in.

## 2018-10-18 ENCOUNTER — Telehealth: Payer: Self-pay | Admitting: *Deleted

## 2018-10-18 ENCOUNTER — Telehealth: Payer: Self-pay | Admitting: Obstetrics & Gynecology

## 2018-10-18 MED ORDER — CEPHALEXIN 500 MG PO CAPS: 500.0000 mg | ORAL_CAPSULE | Freq: Three times a day (TID) | ORAL | 0 refills | Status: DC

## 2018-10-18 NOTE — Telephone Encounter (Signed)
Patient called requesting a prescription for a UTI. Patient states she does not have transportation to come to the office she is living in [place] now and she does not have a pcp per pt she only see's one of our doctors. Please advise [phone]

## 2018-10-18 NOTE — Addendum Note (Signed)
Addended by: Christiana Pellant A on: 10/18/2018 04:38 PM   Modules accepted: Orders

## 2018-10-23 ENCOUNTER — Telehealth: Payer: Self-pay | Admitting: Obstetrics & Gynecology

## 2018-10-23 ENCOUNTER — Other Ambulatory Visit: Payer: Self-pay | Admitting: Obstetrics & Gynecology

## 2018-10-23 MED ORDER — HYDROCODONE-ACETAMINOPHEN 5-325 MG PO TABS: 1.0000 | ORAL_TABLET | Freq: Four times a day (QID) | ORAL | 0 refills | Status: DC | PRN

## 2018-10-23 NOTE — Telephone Encounter (Signed)
Rx sent in for patient

## 2018-10-23 NOTE — Telephone Encounter (Signed)
Yes it does not sound like a simple bladder infection  Did she get her keflex filled and take it

## 2018-10-23 NOTE — Telephone Encounter (Signed)
Patient called, stated that when she goes to the bathroom and it's hurst really bad.  She stated is unbearable.  She is requesting something for pain.    [facility] [address], [place] or [phone] (boyfriend's #)

## 2018-10-23 NOTE — Telephone Encounter (Signed)
Pt reports that she has been taking the abx. She is wanting pain medicine.

## 2018-11-08 ENCOUNTER — Emergency Department (HOSPITAL_COMMUNITY): Payer: Medicaid Other

## 2018-11-08 ENCOUNTER — Encounter (HOSPITAL_COMMUNITY): Payer: Self-pay | Admitting: Emergency Medicine

## 2018-11-08 ENCOUNTER — Other Ambulatory Visit: Payer: Self-pay

## 2018-11-08 ENCOUNTER — Emergency Department (HOSPITAL_COMMUNITY)
Admission: EM | Admit: 2018-11-08 | Discharge: 2018-11-08 | Disposition: A | Discharge status: Home / Self Care | Payer: Medicaid Other | Attending: Emergency Medicine | Admitting: Emergency Medicine

## 2018-11-08 DIAGNOSIS — F1910 Other psychoactive substance abuse, uncomplicated: Secondary | ICD-10-CM | POA: Insufficient documentation

## 2018-11-08 DIAGNOSIS — F17210 Nicotine dependence, cigarettes, uncomplicated: Secondary | ICD-10-CM | POA: Insufficient documentation

## 2018-11-08 DIAGNOSIS — R1011 Right upper quadrant pain: Secondary | ICD-10-CM | POA: Insufficient documentation

## 2018-11-08 DIAGNOSIS — R112 Nausea with vomiting, unspecified: Secondary | ICD-10-CM | POA: Diagnosis not present

## 2018-11-08 DIAGNOSIS — R1110 Vomiting, unspecified: Secondary | ICD-10-CM | POA: Diagnosis not present

## 2018-11-08 DIAGNOSIS — R109 Unspecified abdominal pain: Secondary | ICD-10-CM | POA: Diagnosis not present

## 2018-11-08 LAB — CBC WITH DIFFERENTIAL/PLATELET
Abs Immature Granulocytes: 0.03 10*3/uL (ref 0.00–0.07)
Basophils Absolute: 0 10*3/uL (ref 0.0–0.1)
Basophils Relative: 0 %
Eosinophils Absolute: 0 10*3/uL (ref 0.0–0.5)
Eosinophils Relative: 0 %
HCT: 41.5 % (ref 36.0–46.0)
Hemoglobin: 13.7 g/dL (ref 12.0–15.0)
Immature Granulocytes: 0 %
Lymphocytes Relative: 21 %
Lymphs Abs: 1.9 10*3/uL (ref 0.7–4.0)
MCH: 32.4 pg (ref 26.0–34.0)
MCHC: 33 g/dL (ref 30.0–36.0)
MCV: 98.1 fL (ref 80.0–100.0)
Monocytes Absolute: 0.2 10*3/uL (ref 0.1–1.0)
Monocytes Relative: 2 %
Neutro Abs: 7.2 10*3/uL (ref 1.7–7.7)
Neutrophils Relative %: 77 %
Platelets: 332 10*3/uL (ref 150–400)
RBC: 4.23 MIL/uL (ref 3.87–5.11)
RDW: 13.6 % (ref 11.5–15.5)
WBC: 9.4 10*3/uL (ref 4.0–10.5)
nRBC: 0 % (ref 0.0–0.2)

## 2018-11-08 LAB — URINALYSIS, ROUTINE W REFLEX MICROSCOPIC
Bacteria, UA: NONE SEEN
Bilirubin Urine: NEGATIVE
Glucose, UA: NEGATIVE mg/dL
Ketones, ur: 80 mg/dL — AB
Leukocytes,Ua: NEGATIVE
Nitrite: NEGATIVE
Protein, ur: 30 mg/dL — AB
Specific Gravity, Urine: 1.021 (ref 1.005–1.030)
pH: 5 (ref 5.0–8.0)

## 2018-11-08 LAB — RAPID URINE DRUG SCREEN, HOSP PERFORMED
Amphetamines: NOT DETECTED
Barbiturates: NOT DETECTED
Benzodiazepines: POSITIVE — AB
Cocaine: POSITIVE — AB
Opiates: NOT DETECTED
Tetrahydrocannabinol: POSITIVE — AB

## 2018-11-08 LAB — POCT I-STAT EG7
Acid-base deficit: 10 mmol/L — ABNORMAL HIGH (ref 0.0–2.0)
Bicarbonate: 16.4 mmol/L — ABNORMAL LOW (ref 20.0–28.0)
Calcium, Ion: 1.2 mmol/L (ref 1.15–1.40)
HCT: 41 % (ref 36.0–46.0)
Hemoglobin: 13.9 g/dL (ref 12.0–15.0)
O2 Saturation: 76 %
Potassium: 4.5 mmol/L (ref 3.5–5.1)
Sodium: 140 mmol/L (ref 135–145)
TCO2: 17 mmol/L — ABNORMAL LOW (ref 22–32)
pCO2, Ven: 35.3 mmHg — ABNORMAL LOW (ref 44.0–60.0)
pH, Ven: 7.275 (ref 7.250–7.430)
pO2, Ven: 45 mmHg (ref 32.0–45.0)

## 2018-11-08 LAB — COMPREHENSIVE METABOLIC PANEL
ALT: 18 U/L (ref 0–44)
AST: 36 U/L (ref 15–41)
Albumin: 4.1 g/dL (ref 3.5–5.0)
Alkaline Phosphatase: 63 U/L (ref 38–126)
Anion gap: 15 (ref 5–15)
BUN: 9 mg/dL (ref 6–20)
CO2: 13 mmol/L — ABNORMAL LOW (ref 22–32)
Calcium: 9.1 mg/dL (ref 8.9–10.3)
Chloride: 110 mmol/L (ref 98–111)
Creatinine, Ser: 0.97 mg/dL (ref 0.44–1.00)
GFR calc Af Amer: >60 mL/min (ref >60)
GFR calc non Af Amer: >60 mL/min (ref >60)
Glucose, Bld: 70 mg/dL (ref 70–99)
Potassium: 5.5 mmol/L — ABNORMAL HIGH (ref 3.5–5.1)
Sodium: 138 mmol/L (ref 135–145)
Total Bilirubin: 1.9 mg/dL — ABNORMAL HIGH (ref 0.3–1.2)
Total Protein: 7 g/dL (ref 6.5–8.1)

## 2018-11-08 LAB — I-STAT BETA HCG BLOOD, ED (MC, WL, AP ONLY): I-stat hCG, quantitative: <5 m[IU]/mL (ref <5)

## 2018-11-08 LAB — ETHANOL: Alcohol, Ethyl (B): <10 mg/dL (ref <10)

## 2018-11-08 LAB — LIPASE, BLOOD: Lipase: 19 U/L (ref 11–51)

## 2018-11-08 MED ORDER — SODIUM CHLORIDE 0.9 % IV BOLUS: 1000.0000 mL | Freq: Once | INTRAVENOUS | Status: DC

## 2018-11-08 MED ORDER — HYDROCODONE-ACETAMINOPHEN 5-325 MG PO TABS
1.0000 | ORAL_TABLET | Freq: Once | ORAL | Status: AC
  Administered 2018-11-08: 13:00:00 1 via ORAL
  Filled 2018-11-08: qty 1

## 2018-11-08 MED ORDER — ONDANSETRON 8 MG PO TBDP: 8.0000 mg | ORAL_TABLET | Freq: Three times a day (TID) | ORAL | 0 refills | Status: DC | PRN

## 2018-11-08 MED ORDER — ONDANSETRON HCL 4 MG/2ML IJ SOLN
4.0000 mg | Freq: Once | INTRAMUSCULAR | Status: AC
  Administered 2018-11-08: 4 mg via INTRAVENOUS
  Filled 2018-11-08: qty 2

## 2018-11-08 MED ORDER — PROMETHAZINE HCL 25 MG/ML IJ SOLN
25.0000 mg | Freq: Once | INTRAMUSCULAR | Status: AC
  Administered 2018-11-08: 25 mg via INTRAVENOUS
  Filled 2018-11-08: qty 1

## 2018-11-08 MED ORDER — SODIUM CHLORIDE 0.9 % IV BOLUS
1000.0000 mL | Freq: Once | INTRAVENOUS | Status: AC
  Administered 2018-11-08: 12:00:00 1000 mL via INTRAVENOUS

## 2018-11-08 MED ORDER — METOCLOPRAMIDE HCL 5 MG/ML IJ SOLN
10.0000 mg | Freq: Once | INTRAMUSCULAR | Status: AC
  Administered 2018-11-08: 10 mg via INTRAVENOUS
  Filled 2018-11-08: qty 2

## 2018-11-08 MED ORDER — IOHEXOL 300 MG/ML  SOLN
100.0000 mL | Freq: Once | INTRAMUSCULAR | Status: AC | PRN
  Administered 2018-11-08: 100 mL via INTRAVENOUS

## 2018-11-08 MED ORDER — MORPHINE SULFATE (PF) 4 MG/ML IV SOLN
4.0000 mg | Freq: Once | INTRAVENOUS | Status: AC
  Administered 2018-11-08: 4 mg via INTRAVENOUS
  Filled 2018-11-08: qty 1

## 2018-11-08 NOTE — ED Notes (Signed)
Patient transported to US 

## 2018-11-08 NOTE — ED Notes (Signed)
1 episode of vomiting

## 2018-11-08 NOTE — ED Notes (Signed)
Pt moving around and upside down in bed. Pt changed and put on clean sheets. IV was removed by pt.

## 2018-11-08 NOTE — ED Triage Notes (Signed)
Pt was brought back into ED due to being found asleep on a bench & not responding, once awake she was agitated & stated her name & that she was "throwing up." She is rousable at this time.

## 2018-11-08 NOTE — ED Provider Notes (Signed)
  Physical Exam  BP (!) 147/84   Pulse 93   Resp (!) 23   SpO2 93%   Physical Exam  ED Course/Procedures     Procedures  MDM   I had assumed care of this patient from the PA.  Patient had presented to the ED with abdominal pain and vomiting.  She was somnolent.  She was not very cooperative with history and exam.  Patient states that she is just tired and does not give meaningful history.  APP had ordered an ultrasound followed by CT scan.  The imaging is negative then patient can be discharged.  UDS is positive for polysubstance.   I had assessed the patient at 5:30 PM after her results had come back.  She responded to my questions, but required significant effort from our side to have her answer those questions.  She told me that she just feels weak.  She lives with her sister.  She started feeling nauseated yesterday/last night.  She states that she uses drugs but is not answering what drugs and when was the last time she used any.  I informed her that I will discharge her within the next few minutes, and she understood.  At 630 and informed her that we will discharge her.  Nurse tried to walk patient, patient was not cooperative.  When we take the covers off she sits up.  We will discharge her.  Security has been summoned.  There is no medical etiology behind her weakness.     Varney Biles, MD 11/08/18 [phone]

## 2018-11-08 NOTE — ED Provider Notes (Signed)
[facility] EMERGENCY DEPARTMENT Provider Note   CSN: [id number] Arrival date & time: 11/08/18  1117     History   Chief Complaint Chief Complaint  Patient presents with  . Emesis    HPI Janet Dixon is a 33 y.o. female.     HPI Patient presents to the emergency department with abdominal pain and vomiting.  The patient will only wake up and answer some questions.  She is capable of answering but does not answer all my questions.  The patient is very uncooperative.  The patient will answer some people's questions and then not others.  Patient was apparently found lying on a bench here at the hospital on the first floor.  Patient will arouse without much difficulty and will answer questions.  Patient states she has had abdominal pain and vomiting since yesterday. Past Medical History:  Diagnosis Date  . Asthma   . History of chlamydia   . History of gonorrhea   . History of trichomoniasis   . Seizures [facility])     Patient Active Problem List   Diagnosis Date Noted  . Abdominal pain   . Urinary tract infection without hematuria   . Intractable vomiting   . Pyelonephritis 08/04/2018  . Status post repeat low transverse cesarean section 12/09/2017  . Drug use affecting pregnancy 06/08/2017  . Elevated lipase 04/14/2017  . Stomach pain 04/14/2017  . History of C-section 04/14/2017  . Hematuria 12/07/2016  . History of chlamydia 12/07/2016  . Papanicolaou smear of cervix with positive high risk human papilloma virus (HPV) test 09/07/2016  . Trichimoniasis 08/29/2016  . Seizure disorder ([place]) 05/01/2014  . Cocaine abuse ([place]) 03/14/2014    Past Surgical History:  Procedure Laterality Date  . CESAREAN SECTION    . CESAREAN SECTION N/A 12/02/2014   Procedure: REPEAT CESAREAN SECTION;  Surgeon: Jonnie Kind, MD;  Location: [facility];  Service: Obstetrics;  Laterality: N/A;  . CESAREAN SECTION N/A 12/09/2017   Procedure: CESAREAN SECTION;  Surgeon: Jonnie Kind, MD;  Location: [facility];  Service: Obstetrics;  Laterality: N/A;     OB History    Gravida  7   Para  7   Term  7   Preterm      AB  0   Living  7     SAB  0   TAB      Ectopic      Multiple  0   Live Births  7            Home Medications    Prior to Admission medications   Medication Sig Start Date End Date Taking? Authorizing Provider  albuterol (PROVENTIL HFA;VENTOLIN HFA) 108 (90 Base) MCG/ACT inhaler Inhale 2 puffs into the lungs every 6 (six) hours as needed for wheezing or shortness of breath. 08/29/16   Estill Dooms, NP  cephALEXin (KEFLEX) 500 MG capsule Take 1 capsule (500 mg total) by mouth 3 (three) times daily. 10/18/18   Florian Buff, MD  HYDROcodone-acetaminophen (NORCO/VICODIN) 5-325 MG tablet Take 1 tablet by mouth every 6 (six) hours as needed. 10/23/18   Florian Buff, MD  ondansetron (ZOFRAN ODT) 4 MG disintegrating tablet Take 1 tablet (4 mg total) by mouth every 8 (eight) hours as needed for nausea or vomiting. 08/08/18   Roma Schanz, CNM  potassium chloride (K-DUR) 10 MEQ tablet Take 1 tablet (10 mEq total) by mouth 2 (two) times daily for 3 days.  08/05/18 08/08/18  [place] Bing, DO  Prenatal Vit-Fe Fumarate-FA (PNV PRENATAL PLUS MULTIVITAMIN) 27-1 MG TABS Take 1 tablet by mouth daily. Patient not taking: Reported on 08/04/2018 08/30/17   Christin Fudge, CNM    Family History Family History  Problem Relation Age of Onset  . Hypertension Mother   . Asthma Brother   . Asthma Daughter   . Eczema Daughter   . Asthma Son   . Eczema Son   . Aneurysm Maternal Grandmother   . Heart disease Maternal Grandmother   . Cancer Maternal Grandmother   . Asthma Daughter   . Eczema Daughter   . Asthma Son   . Eczema Son   . Eczema Son   . Cancer Maternal Grandfather        colon  . Eczema Maternal Aunt     Social History Social History   Tobacco Use  . Smoking status: Current Every Day Smoker     Packs/day: 0.50    Years: 13.00    Pack years: 6.50    Types: Cigarettes  . Smokeless tobacco: Never Used  . Tobacco comment: 3 per day  Substance Use Topics  . Alcohol use: No    Comment: none x 1 month ago  . Drug use: Yes    Types: Marijuana, Cocaine    Comment: states its been almost a year     Allergies   Macrobid [nitrofurantoin macrocrystal], Diphenhydramine hcl, Ibuprofen, and Flexeril [cyclobenzaprine hcl]   Review of Systems Review of Systems Level 5 caveat due to uncooperativeness.  Physical Exam Updated Vital Signs BP (!) 146/84   Pulse 64   Resp 19   SpO2 97%   Physical Exam Vitals signs and nursing note reviewed.  Constitutional:      General: She is not in acute distress.    Appearance: She is well-developed.  HENT:     Head: Normocephalic and atraumatic.  Eyes:     Pupils: Pupils are equal, round, and reactive to light.  Neck:     Musculoskeletal: Normal range of motion and neck supple.  Cardiovascular:     Rate and Rhythm: Normal rate and regular rhythm.     Heart sounds: Normal heart sounds. No murmur. No friction rub. No gallop.   Pulmonary:     Effort: Pulmonary effort is normal. No respiratory distress.     Breath sounds: Normal breath sounds. No wheezing.  Abdominal:     General: Bowel sounds are normal. There is no distension.     Palpations: Abdomen is soft.     Tenderness: There is no abdominal tenderness.  Skin:    General: Skin is warm and dry.     Capillary Refill: Capillary refill takes less than 2 seconds.     Findings: No erythema or rash.  Neurological:     Mental Status: She is alert and oriented to person, place, and time.     Motor: No abnormal muscle tone.     Coordination: Coordination normal.  Psychiatric:        Behavior: Behavior normal.      ED Treatments / Results  Labs (all labs ordered are listed, but only abnormal results are displayed) Labs Reviewed  URINALYSIS, ROUTINE W REFLEX MICROSCOPIC - Abnormal;  Notable for the following components:      Result Value   Hgb urine dipstick MODERATE (*)    Ketones, ur 80 (*)    Protein, ur 30 (*)    All other components within normal limits  RAPID URINE DRUG SCREEN, HOSP PERFORMED - Abnormal; Notable for the following components:   Cocaine POSITIVE (*)    Benzodiazepines POSITIVE (*)    Tetrahydrocannabinol POSITIVE (*)    All other components within normal limits  COMPREHENSIVE METABOLIC PANEL - Abnormal; Notable for the following components:   Potassium 5.5 (*)    CO2 13 (*)    Total Bilirubin 1.9 (*)    All other components within normal limits  ETHANOL  CBC WITH DIFFERENTIAL/PLATELET  LIPASE, BLOOD  I-STAT BETA HCG BLOOD, ED ([facility], [facility], [facility] ONLY)  I-STAT VENOUS BLOOD GAS, ED    EKG None  Radiology US Abdomen Limited Ruq  Result Date: 11/08/2018 CLINICAL DATA:  Onset right upper quadrant pain and vomiting today. EXAM: ULTRASOUND ABDOMEN LIMITED RIGHT UPPER QUADRANT COMPARISON:  None. FINDINGS: Gallbladder: No gallstones or wall thickening visualized. No sonographic Murphy sign noted by sonographer. Common bile duct: Diameter: 0.2 cm Liver: 1 cm echogenic focus in the right hepatic lobe is compatible with a hemangioma. The liver otherwise is unremarkable in appearance. Within normal limits in parenchymal echogenicity. Portal vein is patent on color Doppler imaging with normal direction of blood flow towards the liver. Other: None. IMPRESSION: Negative for gallstones.  Negative exam. Electronically Signed   By: Inge Rise M.D.   On: 11/08/2018 13:29    Procedures Procedures (including critical care time)  Medications Ordered in ED Medications  sodium chloride 0.9 % bolus 1,000 mL (0 mLs Intravenous Stopped 11/08/18 1255)  ondansetron (ZOFRAN) injection 4 mg (4 mg Intravenous Given 11/08/18 1150)  HYDROcodone-acetaminophen (NORCO/VICODIN) 5-325 MG per tablet 1 tablet (1 tablet Oral Given 11/08/18 1254)  metoCLOPramide (REGLAN) injection  10 mg (10 mg Intravenous Given 11/08/18 1509)  promethazine (PHENERGAN) injection 25 mg (25 mg Intravenous Given 11/08/18 1512)  morphine 4 MG/ML injection 4 mg (4 mg Intravenous Given 11/08/18 1510)     Initial Impression / Assessment and Plan / ED Course  I have reviewed the triage vital signs and the nursing notes.  Pertinent labs & imaging results that were available during my care of the patient were reviewed by me and considered in my medical decision making (see chart for details).      Patient had one episode of vomiting here in the emergency department.  She still does not want to answer my questions.  She will arouse and answer some of them but states she is in too much pain to talk.   Final Clinical Impressions(s) / ED Diagnoses   Final diagnoses:  RUQ pain    ED Discharge Orders    None       Dalia Heading, PA-C 11/08/18 1534    Blanchie Dessert, MD 11/10/18 [phone]

## 2018-11-08 NOTE — ED Notes (Signed)
Pt refuses to ambulate.

## 2018-11-09 ENCOUNTER — Emergency Department (HOSPITAL_COMMUNITY): Payer: Medicaid Other

## 2018-11-09 ENCOUNTER — Emergency Department (HOSPITAL_COMMUNITY)
Admission: EM | Admit: 2018-11-09 | Discharge: 2018-11-09 | Disposition: A | Discharge status: Home / Self Care | Payer: Medicaid Other | Attending: Emergency Medicine | Admitting: Emergency Medicine

## 2018-11-09 DIAGNOSIS — R509 Fever, unspecified: Secondary | ICD-10-CM | POA: Diagnosis not present

## 2018-11-09 DIAGNOSIS — F17210 Nicotine dependence, cigarettes, uncomplicated: Secondary | ICD-10-CM | POA: Insufficient documentation

## 2018-11-09 DIAGNOSIS — R197 Diarrhea, unspecified: Secondary | ICD-10-CM | POA: Insufficient documentation

## 2018-11-09 DIAGNOSIS — R112 Nausea with vomiting, unspecified: Secondary | ICD-10-CM | POA: Diagnosis not present

## 2018-11-09 LAB — CBC WITH DIFFERENTIAL/PLATELET
Abs Immature Granulocytes: 0.03 10*3/uL (ref 0.00–0.07)
Basophils Absolute: 0 10*3/uL (ref 0.0–0.1)
Basophils Relative: 0 %
Eosinophils Absolute: 0 10*3/uL (ref 0.0–0.5)
Eosinophils Relative: 0 %
HCT: 41.9 % (ref 36.0–46.0)
Hemoglobin: 14.5 g/dL (ref 12.0–15.0)
Immature Granulocytes: 0 %
Lymphocytes Relative: 21 %
Lymphs Abs: 2.2 10*3/uL (ref 0.7–4.0)
MCH: 32.6 pg (ref 26.0–34.0)
MCHC: 34.6 g/dL (ref 30.0–36.0)
MCV: 94.2 fL (ref 80.0–100.0)
Monocytes Absolute: 0.8 10*3/uL (ref 0.1–1.0)
Monocytes Relative: 8 %
Neutro Abs: 7.2 10*3/uL (ref 1.7–7.7)
Neutrophils Relative %: 71 %
Platelets: 357 10*3/uL (ref 150–400)
RBC: 4.45 MIL/uL (ref 3.87–5.11)
RDW: 13.3 % (ref 11.5–15.5)
WBC: 10.3 10*3/uL (ref 4.0–10.5)
nRBC: 0 % (ref 0.0–0.2)

## 2018-11-09 LAB — COMPREHENSIVE METABOLIC PANEL
ALT: 25 U/L (ref 0–44)
AST: 23 U/L (ref 15–41)
Albumin: 4.5 g/dL (ref 3.5–5.0)
Alkaline Phosphatase: 62 U/L (ref 38–126)
Anion gap: 13 (ref 5–15)
BUN: 10 mg/dL (ref 6–20)
CO2: 22 mmol/L (ref 22–32)
Calcium: 9.9 mg/dL (ref 8.9–10.3)
Chloride: 106 mmol/L (ref 98–111)
Creatinine, Ser: 0.96 mg/dL (ref 0.44–1.00)
GFR calc Af Amer: >60 mL/min (ref >60)
GFR calc non Af Amer: >60 mL/min (ref >60)
Glucose, Bld: 112 mg/dL — ABNORMAL HIGH (ref 70–99)
Potassium: 3.7 mmol/L (ref 3.5–5.1)
Sodium: 141 mmol/L (ref 135–145)
Total Bilirubin: 0.7 mg/dL (ref 0.3–1.2)
Total Protein: 8 g/dL (ref 6.5–8.1)

## 2018-11-09 LAB — I-STAT BETA HCG BLOOD, ED (MC, WL, AP ONLY): I-stat hCG, quantitative: <5 m[IU]/mL (ref <5)

## 2018-11-09 LAB — ETHANOL: Alcohol, Ethyl (B): <10 mg/dL (ref <10)

## 2018-11-09 LAB — LIPASE, BLOOD: Lipase: 29 U/L (ref 11–51)

## 2018-11-09 MED ORDER — SODIUM CHLORIDE 0.9 % IV BOLUS
1000.0000 mL | Freq: Once | INTRAVENOUS | Status: AC
  Administered 2018-11-09: 1000 mL via INTRAVENOUS

## 2018-11-09 MED ORDER — ONDANSETRON HCL 4 MG/2ML IJ SOLN
4.0000 mg | Freq: Once | INTRAMUSCULAR | Status: AC
  Administered 2018-11-09: 4 mg via INTRAVENOUS
  Filled 2018-11-09: qty 2

## 2018-11-09 NOTE — ED Provider Notes (Signed)
[facility] EMERGENCY DEPARTMENT Provider Note   CSN: [id number] Arrival date & time: 11/09/18  1333    History   Chief Complaint Chief Complaint  Patient presents with  . Nausea  . Diarrhea    HPI Janet Dixon is a 33 y.o. female presented today for nausea, vomiting, diarrhea and fever x2 days.  Patient seen in the ER yesterday, discharged but reports symptoms not improved.  Work-up reviewed for 11/08/2018: CBC within normal limits Lipase within normal limits Ethanol negative Beta-hCG negative Urinalysis with moderate hemoglobin, ketones and protein UDS positive for cocaine, THC and benzodiazepines CMP with bilirubin of 1.9, potassium 5.5, CO2 13, hemolyzed Follow-up EEG 7 with potassium 4.5, CO2 and bicarb decreased, acid-base 10.0, discussed with Dr. Ashok Cordia likely volume depletion, nonacute ------------------------ RUQ [name]: IMPRESSION: Negative for gallstones. Negative exam. -------------------- CT Abd/Pelvis: IMPRESSION: Small amount of nonspecific free pelvic fluid.  Tiny nonobstructing calculus at inferior pole RIGHT kidney.  Otherwise normal exam. ------------------------------------- Patient with diagnosis of right upper quadrant pain, non-intractable vomiting with nausea and polysubstance abuse. - Patient reports that she returned home had 1-2 episodes of nonbloody/nonbilious emesis and one episode of nonbloody diarrhea.  Denies abdominal pain.  Reports of fever and generalized body aches, denies antipyretic use prior to arrival.  Denies any other symptoms.  Denies headache, vision changes, neck pain/stiffness, chest pain/shortness of breath, cough, abdominal pain, vaginal discharge or concern for STD, SI/HI, hallucinations or any additional concerns.    HPI  Past Medical History:  Diagnosis Date  . Asthma   . History of chlamydia   . History of gonorrhea   . History of trichomoniasis   . Seizures [facility])     Patient Active Problem List    Diagnosis Date Noted  . Abdominal pain   . Urinary tract infection without hematuria   . Intractable vomiting   . Pyelonephritis 08/04/2018  . Status post repeat low transverse cesarean section 12/09/2017  . Drug use affecting pregnancy 06/08/2017  . Elevated lipase 04/14/2017  . Stomach pain 04/14/2017  . History of C-section 04/14/2017  . Hematuria 12/07/2016  . History of chlamydia 12/07/2016  . Papanicolaou smear of cervix with positive high risk human papilloma virus (HPV) test 09/07/2016  . Trichimoniasis 08/29/2016  . Seizure disorder ([place]) 05/01/2014  . Cocaine abuse ([place]) 03/14/2014    Past Surgical History:  Procedure Laterality Date  . CESAREAN SECTION    . CESAREAN SECTION N/A 12/02/2014   Procedure: REPEAT CESAREAN SECTION;  Surgeon: Jonnie Kind, MD;  Location: [facility];  Service: Obstetrics;  Laterality: N/A;  . CESAREAN SECTION N/A 12/09/2017   Procedure: CESAREAN SECTION;  Surgeon: Jonnie Kind, MD;  Location: [facility];  Service: Obstetrics;  Laterality: N/A;     OB History    Gravida  7   Para  7   Term  7   Preterm      AB  0   Living  7     SAB  0   TAB      Ectopic      Multiple  0   Live Births  7            Home Medications    Prior to Admission medications   Medication Sig Start Date End Date Taking? Authorizing Provider  albuterol (PROVENTIL HFA;VENTOLIN HFA) 108 (90 Base) MCG/ACT inhaler Inhale 2 puffs into the lungs every 6 (six) hours as needed for wheezing or shortness of breath. Patient  not taking: Reported on 11/08/2018 08/29/16   Derrek Monaco A, NP  cephALEXin (KEFLEX) 500 MG capsule Take 1 capsule (500 mg total) by mouth 3 (three) times daily. Patient not taking: Reported on 11/08/2018 10/18/18   Florian Buff, MD  HYDROcodone-acetaminophen (NORCO/VICODIN) 5-325 MG tablet Take 1 tablet by mouth every 6 (six) hours as needed. Patient not taking: Reported on 11/08/2018 10/23/18   Florian Buff, MD   ondansetron (ZOFRAN ODT) 8 MG disintegrating tablet Take 1 tablet (8 mg total) by mouth every 8 (eight) hours as needed for nausea. 11/08/18   Varney Biles, MD  potassium chloride (K-DUR) 10 MEQ tablet Take 1 tablet (10 mEq total) by mouth 2 (two) times daily for 3 days. Patient not taking: Reported on 11/08/2018 08/05/18 08/08/18  [place] Bing, DO  Prenatal Vit-Fe Fumarate-FA (PNV PRENATAL PLUS MULTIVITAMIN) 27-1 MG TABS Take 1 tablet by mouth daily. Patient not taking: Reported on 11/08/2018 08/30/17   Christin Fudge, CNM    Family History Family History  Problem Relation Age of Onset  . Hypertension Mother   . Asthma Brother   . Asthma Daughter   . Eczema Daughter   . Asthma Son   . Eczema Son   . Aneurysm Maternal Grandmother   . Heart disease Maternal Grandmother   . Cancer Maternal Grandmother   . Asthma Daughter   . Eczema Daughter   . Asthma Son   . Eczema Son   . Eczema Son   . Cancer Maternal Grandfather        colon  . Eczema Maternal Aunt     Social History Social History   Tobacco Use  . Smoking status: Current Every Day Smoker    Packs/day: 0.50    Years: 13.00    Pack years: 6.50    Types: Cigarettes  . Smokeless tobacco: Never Used  . Tobacco comment: 3 per day  Substance Use Topics  . Alcohol use: No    Comment: none x 1 month ago  . Drug use: Yes    Types: Marijuana, Cocaine    Comment: states its been almost a year     Allergies   Macrobid [nitrofurantoin macrocrystal], Diphenhydramine hcl, Ibuprofen, and Flexeril [cyclobenzaprine hcl]   Review of Systems Review of Systems Ten systems are reviewed and are negative for acute change except as noted in the HPI  Physical Exam Updated Vital Signs BP (!) 147/89   Pulse (!) 58   Temp 100.2 F (37.9 C) (Oral)   Resp 16   Ht 5' 1"  (1.549 m)   SpO2 100%   BMI 21.91 kg/m   Physical Exam Constitutional:      General: She is not in acute distress.    Appearance: Normal  appearance. She is well-developed. She is not ill-appearing or diaphoretic.     Comments: Tired appearing  HENT:     Head: Normocephalic and atraumatic.     Right Ear: External ear normal.     Left Ear: External ear normal.     Nose: Nose normal.  Eyes:     General: Vision grossly intact. Gaze aligned appropriately.     Pupils: Pupils are equal, round, and reactive to light.  Neck:     Musculoskeletal: Normal range of motion.     Trachea: Trachea and phonation normal. No tracheal deviation.  Pulmonary:     Effort: Pulmonary effort is normal. No respiratory distress.  Abdominal:     General: There is no distension.  Palpations: Abdomen is soft.     Tenderness: There is no abdominal tenderness. There is no guarding or rebound.  Genitourinary:    Comments: Pelvic examination deferred by patient. Musculoskeletal: Normal range of motion.  Skin:    General: Skin is warm and dry.  Neurological:     Mental Status: She is alert.     GCS: GCS eye subscore is 4. GCS verbal subscore is 5. GCS motor subscore is 6.     Comments: Speech is clear and goal oriented, follows commands Major Cranial nerves without deficit, no facial droop Moves extremities without ataxia, coordination intact  Psychiatric:        Attention and Perception: She does not perceive auditory or visual hallucinations.        Behavior: Behavior normal.        Thought Content: Thought content does not include homicidal or suicidal ideation.       ED Treatments / Results  Labs (all labs ordered are listed, but only abnormal results are displayed) Labs Reviewed  COMPREHENSIVE METABOLIC PANEL - Abnormal; Notable for the following components:      Result Value   Glucose, Bld 112 (*)    All other components within normal limits  CBC WITH DIFFERENTIAL/PLATELET  LIPASE, BLOOD  ETHANOL  URINALYSIS, ROUTINE W REFLEX MICROSCOPIC  RAPID URINE DRUG SCREEN, HOSP PERFORMED  I-STAT BETA HCG BLOOD, ED ([facility], [facility], [facility] ONLY)     EKG None  Radiology Ct Abdomen Pelvis W Contrast  Result Date: 11/08/2018 CLINICAL DATA:  Abdominal pain, vomiting EXAM: CT ABDOMEN AND PELVIS WITH CONTRAST TECHNIQUE: Multidetector CT imaging of the abdomen and pelvis was performed using the standard protocol following bolus administration of intravenous contrast. Sagittal and coronal MPR images reconstructed from axial data set. CONTRAST:  147m OMNIPAQUE IOHEXOL 300 MG/ML SOLN IV. No oral contrast. COMPARISON:  CT abdomen and pelvis 08/04/2018, ultrasound abdomen 11/08/2018 FINDINGS: Lower chest: Lung bases clear Hepatobiliary: Gallbladder and liver normal appearance Pancreas: Normal appearance Spleen: Normal appearance Adrenals/Urinary Tract: Adrenal glands normal appearance. Tiny nonobstructing calculus at inferior pole RIGHT kidney. Kidneys, ureters, and bladder normal appearance Stomach/Bowel: Normal appearing retrocecal appendix. Stomach and bowel loops grossly normal appearance for exam lacking GI contrast Vascular/Lymphatic: Vascular structures patent. Aorta normal caliber. No definite adenopathy. Reproductive: Unremarkable uterus and adnexa. Other: Small amount of nonspecific low-attenuation free pelvic fluid. No free air. Small umbilical hernia containing fat. Musculoskeletal: Osseous structures unremarkable. IMPRESSION: Small amount of nonspecific free pelvic fluid. Tiny nonobstructing calculus at inferior pole RIGHT kidney. Otherwise normal exam. Electronically Signed   By: MLavonia DanaM.D.   On: 11/08/2018 17:28   Dg Chest Portable 1 View  Result Date: 11/09/2018 CLINICAL DATA:  Nausea, vomiting, diarrhea and fevers for 2 days. EXAM: PORTABLE CHEST 1 VIEW COMPARISON:  PA and lateral chest 03/15/2014. FINDINGS: Lungs are clear. Heart size is normal. No pneumothorax or pleural fluid. No acute or focal bony abnormality. IMPRESSION: Negative chest. Electronically Signed   By: TInge RiseM.D.   On: 11/09/2018 16:25   [place]domen Limited  Ruq  Result Date: 11/08/2018 CLINICAL DATA:  Onset right upper quadrant pain and vomiting today. EXAM: ULTRASOUND ABDOMEN LIMITED RIGHT UPPER QUADRANT COMPARISON:  None. FINDINGS: Gallbladder: No gallstones or wall thickening visualized. No sonographic Murphy sign noted by sonographer. Common bile duct: Diameter: 0.2 cm Liver: 1 cm echogenic focus in the right hepatic lobe is compatible with a hemangioma. The liver otherwise is unremarkable in appearance. Within normal limits in parenchymal  echogenicity. Portal vein is patent on color Doppler imaging with normal direction of blood flow towards the liver. Other: None. IMPRESSION: Negative for gallstones.  Negative exam. Electronically Signed   By: Inge Rise M.D.   On: 11/08/2018 13:29    Procedures Procedures (including critical care time)  Medications Ordered in ED Medications  sodium chloride 0.9 % bolus 1,000 mL (1,000 mLs Intravenous New Bag/Given 11/09/18 1459)  ondansetron (ZOFRAN) injection 4 mg (4 mg Intravenous Given 11/09/18 1758)     Initial Impression / Assessment and Plan / ED Course  I have reviewed the triage vital signs and the nursing notes.  Pertinent labs & imaging results that were available during my care of the patient were reviewed by me and considered in my medical decision making (see chart for details).    CBC within normal limits CMP with glucose of 112, otherwise within normal limits Lipase within normal limit Ethanol negative Beta-hCG negative UDS and urinalysis pending Chest x-ray obtained as thought that possible pneumonia may be causing her fever and nausea/vomiting, lungs clear to auscultation bilaterally; CXR:  IMPRESSION:  Negative chest.   Patient given 1 L normal saline bolus. - Patient was given ginger ale reports nausea and vomiting thereafter.  4 mg of Zofran ordered. - Shortly after receiving her Zofran patient is requesting to leave.  She does not want to provide a urinalysis today.   She is without urinary symptoms and with urinalysis yesterday which was negative for signs of infection feel this is reasonable.  Case and work-up for both today and yesterday were discussed with Dr. 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  Patient to continue water rehydration and follow-up with PCP.  Patient informed to self quarantine with fever until symptom-free for 10 days.  Low suspicion for COVID-19 virus as etiology of her symptoms. - Doubt acute abdominal pelvic etiologies of her symptoms today.  She had reassuring ultrasound and CT scan yesterday.  Additionally patient deferred pelvic examination today, she has no lower abdominal tenderness on examination, she denies any concern for STD, vaginal discharge, dysuria/hematuria, low suspicion for PID at this time.  On reexamination of the abdomen soft nontender without peritoneal signs or distention.  Patient has dressed herself resting comfortably and in no acute distress.  At this time there does not appear to be any evidence of an acute emergency medical condition and the patient appears stable for discharge with appropriate outpatient follow up. Diagnosis was discussed with patient who verbalizes understanding of care plan and is agreeable to discharge. I have discussed return precautions with patient who verbalizes understanding of return precautions. Patient encouraged to follow-up with their PCP. All questions answered.  Patient has been discharged in good condition.  She has Zofran prescribed to her yesterday no need for refill at this time.   Janet Dixon was evaluated in Emergency Department on 11/09/2018 for the symptoms described in the history of present illness. She was evaluated in the context of the global COVID-19 pandemic, which necessitated consideration that the patient might be at risk for infection with  the SARS-CoV-2 virus that causes COVID-19. Institutional protocols and algorithms that pertain to the evaluation of patients at risk for COVID-19 are in a state of rapid change based on information released by regulatory bodies including the CDC and federal and state organizations. These policies and algorithms were followed during the patient's care in the ED.  Note: Portions of this report may have been transcribed using voice recognition software. Every effort was made to ensure accuracy; however, inadvertent computerized transcription errors may still be present. Final Clinical Impressions(s) / ED Diagnoses   Final diagnoses:  Nausea vomiting and diarrhea    ED Discharge Orders    None       Gari Crown 11/09/18 Yevette Edwards    Lajean Saver, MD 11/10/18 [phone]

## 2018-11-09 NOTE — Discharge Instructions (Addendum)
You have been diagnosed today with nausea, vomiting and diarrhea.  At this time there does not appear to be the presence of an emergent medical condition, however there is always the potential for conditions to change. Please read and follow the below instructions.  Please return to the Emergency Department immediately for any new or worsening symptoms. Please be sure to follow up with your Primary Care Provider within one week regarding your visit today; please call their office to schedule an appointment even if you are feeling better for a follow-up visit. Please avoid further drug use as this may be attributing to your symptoms.  Avoid use of marijuana as this can cause vomiting. Continue to self quarantine until symptom-free and fever free for 7 days.  Call your primary care provider tomorrow to schedule follow-up appointment.   Get help right away if: You have pain in your chest, neck, arm, or jaw. You feel very weak or you pass out (faint). You throw up again and again. You have throw up that is bright red or looks like black coffee grounds. You have bloody or black poop (stools) or poop that looks like tar. You have a very bad headache, a stiff neck, or both. You have very bad pain, cramping, or bloating in your belly (abdomen). You have trouble breathing. You are breathing very quickly. Your heart is beating very quickly. Your skin feels cold and clammy. You feel confused. You have signs of losing too much water in your body, such as: Dark pee, very little pee, or no pee. Cracked lips. Dry mouth. Sunken eyes. Sleepiness. Weakness. You have any new/concerning or worsening symptoms  Please read the additional information packets attached to your discharge summary.

## 2018-11-09 NOTE — ED Notes (Signed)
Gave pt a cup of water per PA [address].

## 2018-11-09 NOTE — ED Triage Notes (Signed)
Pt presents to the ED with nausea, vomiting, diarrhea and fevers x2 days. Pt was seen yesterday and reports it has gotten worse since then. Pt reports lower back pain and abdominal pain.

## 2018-11-09 NOTE — ED Notes (Signed)
Pt reporting that she would like to leave. PA made aware by RN.

## 2018-11-12 LAB — CBG MONITORING, ED: Glucose-Capillary: 96 mg/dL (ref 70–99)

## 2019-01-09 ENCOUNTER — Encounter (HOSPITAL_COMMUNITY): Payer: Self-pay | Admitting: Emergency Medicine

## 2019-01-09 ENCOUNTER — Other Ambulatory Visit: Payer: Self-pay

## 2019-01-09 ENCOUNTER — Emergency Department (HOSPITAL_COMMUNITY)
Admission: EM | Admit: 2019-01-09 | Discharge: 2019-01-10 | Disposition: A | Discharge status: Home / Self Care | Payer: Medicaid Other | Attending: Emergency Medicine | Admitting: Emergency Medicine

## 2019-01-09 ENCOUNTER — Emergency Department (HOSPITAL_COMMUNITY): Payer: Medicaid Other

## 2019-01-09 DIAGNOSIS — R1084 Generalized abdominal pain: Secondary | ICD-10-CM | POA: Diagnosis present

## 2019-01-09 DIAGNOSIS — R112 Nausea with vomiting, unspecified: Secondary | ICD-10-CM | POA: Diagnosis not present

## 2019-01-09 DIAGNOSIS — R1110 Vomiting, unspecified: Secondary | ICD-10-CM | POA: Diagnosis not present

## 2019-01-09 DIAGNOSIS — F1290 Cannabis use, unspecified, uncomplicated: Secondary | ICD-10-CM | POA: Insufficient documentation

## 2019-01-09 DIAGNOSIS — Z20828 Contact with and (suspected) exposure to other viral communicable diseases: Secondary | ICD-10-CM | POA: Insufficient documentation

## 2019-01-09 DIAGNOSIS — K529 Noninfective gastroenteritis and colitis, unspecified: Secondary | ICD-10-CM

## 2019-01-09 DIAGNOSIS — F17210 Nicotine dependence, cigarettes, uncomplicated: Secondary | ICD-10-CM | POA: Diagnosis not present

## 2019-01-09 DIAGNOSIS — R4182 Altered mental status, unspecified: Secondary | ICD-10-CM | POA: Diagnosis not present

## 2019-01-09 DIAGNOSIS — R109 Unspecified abdominal pain: Secondary | ICD-10-CM | POA: Diagnosis not present

## 2019-01-09 DIAGNOSIS — Z03818 Encounter for observation for suspected exposure to other biological agents ruled out: Secondary | ICD-10-CM | POA: Diagnosis not present

## 2019-01-09 LAB — URINALYSIS, ROUTINE W REFLEX MICROSCOPIC
Bilirubin Urine: NEGATIVE
Glucose, UA: NEGATIVE mg/dL
Ketones, ur: 40 mg/dL — AB
Leukocytes,Ua: NEGATIVE
Nitrite: POSITIVE — AB
Protein, ur: 30 mg/dL — AB
Specific Gravity, Urine: >1.03 — ABNORMAL HIGH (ref 1.005–1.030)
pH: 5.5 (ref 5.0–8.0)

## 2019-01-09 LAB — COMPREHENSIVE METABOLIC PANEL
ALT: 26 U/L (ref 0–44)
AST: 25 U/L (ref 15–41)
Albumin: 5.1 g/dL — ABNORMAL HIGH (ref 3.5–5.0)
Alkaline Phosphatase: 58 U/L (ref 38–126)
Anion gap: 17 — ABNORMAL HIGH (ref 5–15)
BUN: 14 mg/dL (ref 6–20)
CO2: 19 mmol/L — ABNORMAL LOW (ref 22–32)
Calcium: 10.2 mg/dL (ref 8.9–10.3)
Chloride: 105 mmol/L (ref 98–111)
Creatinine, Ser: 0.77 mg/dL (ref 0.44–1.00)
GFR calc Af Amer: >60 mL/min (ref >60)
GFR calc non Af Amer: >60 mL/min (ref >60)
Glucose, Bld: 123 mg/dL — ABNORMAL HIGH (ref 70–99)
Potassium: 3.4 mmol/L — ABNORMAL LOW (ref 3.5–5.1)
Sodium: 141 mmol/L (ref 135–145)
Total Bilirubin: 0.7 mg/dL (ref 0.3–1.2)
Total Protein: 8.3 g/dL — ABNORMAL HIGH (ref 6.5–8.1)

## 2019-01-09 LAB — CBC WITH DIFFERENTIAL/PLATELET
Abs Immature Granulocytes: 0.04 10*3/uL (ref 0.00–0.07)
Basophils Absolute: 0 10*3/uL (ref 0.0–0.1)
Basophils Relative: 0 %
Eosinophils Absolute: 0 10*3/uL (ref 0.0–0.5)
Eosinophils Relative: 0 %
HCT: 42 % (ref 36.0–46.0)
Hemoglobin: 14.9 g/dL (ref 12.0–15.0)
Immature Granulocytes: 0 %
Lymphocytes Relative: 18 %
Lymphs Abs: 2.6 10*3/uL (ref 0.7–4.0)
MCH: 33.6 pg (ref 26.0–34.0)
MCHC: 35.5 g/dL (ref 30.0–36.0)
MCV: 94.6 fL (ref 80.0–100.0)
Monocytes Absolute: 0.7 10*3/uL (ref 0.1–1.0)
Monocytes Relative: 5 %
Neutro Abs: 11.1 10*3/uL — ABNORMAL HIGH (ref 1.7–7.7)
Neutrophils Relative %: 77 %
Platelets: 397 10*3/uL (ref 150–400)
RBC: 4.44 MIL/uL (ref 3.87–5.11)
RDW: 13.1 % (ref 11.5–15.5)
WBC: 14.5 10*3/uL — ABNORMAL HIGH (ref 4.0–10.5)
nRBC: 0 % (ref 0.0–0.2)

## 2019-01-09 LAB — RAPID URINE DRUG SCREEN, HOSP PERFORMED
Amphetamines: NOT DETECTED
Barbiturates: NOT DETECTED
Benzodiazepines: NOT DETECTED
Cocaine: NOT DETECTED
Opiates: NOT DETECTED
Tetrahydrocannabinol: POSITIVE — AB

## 2019-01-09 LAB — URINALYSIS, MICROSCOPIC (REFLEX)

## 2019-01-09 LAB — CBG MONITORING, ED: Glucose-Capillary: 124 mg/dL — ABNORMAL HIGH (ref 70–99)

## 2019-01-09 LAB — LACTIC ACID, PLASMA: Lactic Acid, Venous: 2 mmol/L (ref 0.5–1.9)

## 2019-01-09 LAB — ETHANOL: Alcohol, Ethyl (B): <10 mg/dL (ref <10)

## 2019-01-09 LAB — LIPASE, BLOOD: Lipase: 34 U/L (ref 11–51)

## 2019-01-09 LAB — I-STAT BETA HCG BLOOD, ED (MC, WL, AP ONLY): I-stat hCG, quantitative: <5 m[IU]/mL (ref <5)

## 2019-01-09 MED ORDER — SODIUM CHLORIDE 0.9 % IV BOLUS
1000.0000 mL | Freq: Once | INTRAVENOUS | Status: AC
  Administered 2019-01-09: 1000 mL via INTRAVENOUS

## 2019-01-09 MED ORDER — METOCLOPRAMIDE HCL 5 MG/ML IJ SOLN
10.0000 mg | Freq: Once | INTRAMUSCULAR | Status: AC
  Administered 2019-01-09: 10 mg via INTRAVENOUS
  Filled 2019-01-09: qty 2

## 2019-01-09 MED ORDER — MAGNESIUM SULFATE 2 GM/50ML IV SOLN
2.0000 g | Freq: Once | INTRAVENOUS | Status: AC
  Administered 2019-01-09: 2 g via INTRAVENOUS
  Filled 2019-01-09: qty 50

## 2019-01-09 MED ORDER — SODIUM CHLORIDE 0.9 % IV SOLN
1.0000 g | Freq: Once | INTRAVENOUS | Status: AC
  Administered 2019-01-09: 1 g via INTRAVENOUS
  Filled 2019-01-09: qty 10

## 2019-01-09 MED ORDER — IOHEXOL 300 MG/ML  SOLN
100.0000 mL | Freq: Once | INTRAMUSCULAR | Status: AC | PRN
  Administered 2019-01-09: 14:00:00 100 mL via INTRAVENOUS

## 2019-01-09 MED ORDER — HALOPERIDOL LACTATE 5 MG/ML IJ SOLN
4.0000 mg | Freq: Once | INTRAMUSCULAR | Status: AC
  Administered 2019-01-09: 4 mg via INTRAVENOUS
  Filled 2019-01-09: qty 1

## 2019-01-09 MED ORDER — DICYCLOMINE HCL 20 MG PO TABS: 20.0000 mg | ORAL_TABLET | Freq: Two times a day (BID) | ORAL | 0 refills | Status: DC

## 2019-01-09 MED ORDER — CEPHALEXIN 500 MG PO CAPS: 500.0000 mg | ORAL_CAPSULE | Freq: Two times a day (BID) | ORAL | 0 refills | Status: AC

## 2019-01-09 MED ORDER — KETOROLAC TROMETHAMINE 30 MG/ML IJ SOLN: 15.0000 mg | Freq: Once | INTRAMUSCULAR | Status: DC

## 2019-01-09 MED ORDER — ONDANSETRON 4 MG PO TBDP: 4.0000 mg | ORAL_TABLET | Freq: Three times a day (TID) | ORAL | 0 refills | Status: DC | PRN

## 2019-01-09 NOTE — Discharge Instructions (Signed)
You have been tested for COVID19 and your test is still pending.  This can cause nausea, vomiting and diarrhea. Please stay quarantined until your results return.

## 2019-01-09 NOTE — ED Notes (Signed)
Pt verbalized understanding of discharge instructions, prescriptions reviewed.

## 2019-01-09 NOTE — ED Provider Notes (Signed)
  Physical Exam  BP (!) 153/89   Pulse 69   Temp 98 F (36.7 C) (Oral)   Resp 13   LMP 08/04/2018   SpO2 99%   Physical Exam  ED Course/Procedures     Procedures  MDM  Received care of patient at 330PM from Dr. Alvino Chapel. Please see his note for prior care. Briefly, this is a 33yo female who initially presented with altered mental status who improved and then reported abdominal pain, nausea, vomiting and diarrhea.  CT pending at this time with rectal contrast to evaluate for rectal abscess given initial cT with some concern. UA does show UTI.  Pt reports continuing nausea abdominal pain and headache. Given haldol and Mg for treatment with improvement.  CT completed shows findings of enteritis. UA consistent also with UTI. Was given rocephin in ED.    Patient improved, VS stable. COVID test pending at time of discharge.   Suspect likely UTI, and possible gastroenteritis given pt reporting n/v/diarrhea.  Tolerating po. Given rx for keflex, bentyl and zofran. Patient discharged in stable condition with understanding of reasons to return.        Gareth Morgan, MD 01/10/19 1250

## 2019-01-09 NOTE — ED Triage Notes (Addendum)
Pt here, found outside the employee entrance on the ground by OR staff. Pt vomited profusely in the waiting room, on arrival to room pt sts having abdominal pain starting yesterday, is one month pregnant and was trying to come to the ER for such. Denies drug use, sts she took a Percocet last night for the abdominal pain. Pt seems to be responding to external stimuli. Will be restful and calm and then will suddenly awake and appear frightened. Answering some questions appropriately but not consistently. Last smoked Marijuana yesterday.

## 2019-01-09 NOTE — ED Notes (Signed)
Patient encouraged to stop drinking her apple juice. PT continues to drink her apple juice

## 2019-01-09 NOTE — ED Provider Notes (Addendum)
[facility] Provider Note   CSN: [id number] Arrival date & time: 01/09/19  1165     History   Chief Complaint Chief Complaint  Patient presents with  . Abdominal Pain  . Altered Mental Status    HPI [facility] is a 33 y.o. female.    Level 5 caveat due to altered mental status. HPI Patient reported came in for abdominal pain vomiting.  Patient really will not provide me much history and is actively vomiting.  However told nurse that she is 1 month pregnant and was trying to be seen for that.  Found outside the employee entrance by the [place].  Had told nurse that she took a Percocet for her abdominal pain.  Entire abdomen hurts.  Vomited large amount upon arrival to the ER. Past Medical History:  Diagnosis Date  . Asthma   . History of chlamydia   . History of gonorrhea   . History of trichomoniasis   . Seizures [facility])     Patient Active Problem List   Diagnosis Date Noted  . Abdominal pain   . Urinary tract infection without hematuria   . Intractable vomiting   . Pyelonephritis 08/04/2018  . Status post repeat low transverse cesarean section 12/09/2017  . Drug use affecting pregnancy 06/08/2017  . Elevated lipase 04/14/2017  . Stomach pain 04/14/2017  . History of C-section 04/14/2017  . Hematuria 12/07/2016  . History of chlamydia 12/07/2016  . Papanicolaou smear of cervix with positive high risk human papilloma virus (HPV) test 09/07/2016  . Trichimoniasis 08/29/2016  . Seizure disorder ([place]) 05/01/2014  . Cocaine abuse ([place]) 03/14/2014    Past Surgical History:  Procedure Laterality Date  . CESAREAN SECTION    . CESAREAN SECTION N/A 12/02/2014   Procedure: REPEAT CESAREAN SECTION;  Surgeon: Jonnie Kind, MD;  Location: [place] [facility];  Service: Obstetrics;  Laterality: N/A;  . CESAREAN SECTION N/A 12/09/2017   Procedure: CESAREAN SECTION;  Surgeon: Jonnie Kind, MD;  Location: [place];  Service:  Obstetrics;  Laterality: N/A;     OB History    Gravida  8   Para  7   Term  7   Preterm      AB  0   Living  7     SAB  0   TAB      Ectopic      Multiple  0   Live Births  7            Home Medications    Prior to Admission medications   Medication Sig Start Date End Date Taking? Authorizing Provider  albuterol (PROVENTIL HFA;VENTOLIN HFA) 108 (90 Base) MCG/ACT inhaler Inhale 2 puffs into the lungs every 6 (six) hours as needed for wheezing or shortness of breath. Patient not taking: Reported on 11/08/2018 08/29/16   Estill Dooms, NP  HYDROcodone-acetaminophen (NORCO/VICODIN) 5-325 MG tablet Take 1 tablet by mouth every 6 (six) hours as needed. Patient not taking: Reported on 11/08/2018 10/23/18   Florian Buff, MD  ondansetron (ZOFRAN ODT) 8 MG disintegrating tablet Take 1 tablet (8 mg total) by mouth every 8 (eight) hours as needed for nausea. Patient not taking: Reported on 01/09/2019 11/08/18   Varney Biles, MD  potassium chloride (K-DUR) 10 MEQ tablet Take 1 tablet (10 mEq total) by mouth 2 (two) times daily for 3 days. Patient not taking: Reported on 11/08/2018 08/05/18 08/08/18  [place] Bing, DO  Prenatal  Vit-Fe Fumarate-FA (PNV PRENATAL PLUS MULTIVITAMIN) 27-1 MG TABS Take 1 tablet by mouth daily. Patient not taking: Reported on 11/08/2018 08/30/17   Christin Fudge, CNM    Family History Family History  Problem Relation Age of Onset  . Hypertension Mother   . Asthma Brother   . Asthma Daughter   . Eczema Daughter   . Asthma Son   . Eczema Son   . Aneurysm Maternal Grandmother   . Heart disease Maternal Grandmother   . Cancer Maternal Grandmother   . Asthma Daughter   . Eczema Daughter   . Asthma Son   . Eczema Son   . Eczema Son   . Cancer Maternal Grandfather        colon  . Eczema Maternal Aunt     Social History Social History   Tobacco Use  . Smoking status: Current Every Day Smoker    Packs/day: 0.50    Years:  13.00    Pack years: 6.50    Types: Cigarettes  . Smokeless tobacco: Never Used  . Tobacco comment: 3 per day  Substance Use Topics  . Alcohol use: No    Comment: none x 1 month ago  . Drug use: Yes    Types: Marijuana, Cocaine    Comment: states its been almost a year     Allergies   Macrobid [nitrofurantoin macrocrystal], Diphenhydramine hcl, Ibuprofen, and Flexeril [cyclobenzaprine hcl]   Review of Systems Review of Systems  Unable to perform ROS: Mental status change     Physical Exam Updated Vital Signs BP (!) 153/89   Pulse 69   Temp 98 F (36.7 C) (Oral)   Resp 13   LMP 08/04/2018   SpO2 99%   Physical Exam Vitals signs and nursing note reviewed.  HENT:     Head: Atraumatic.  Eyes:     Pupils: Pupils are equal, round, and reactive to light.  Cardiovascular:     Rate and Rhythm: Regular rhythm.  Pulmonary:     Breath sounds: No wheezing, rhonchi or rales.  Abdominal:     Comments: Difficult examination as patient will not let me palpate much of her abdomen.  She keeps pulling my hand away.  Patient will sit up and vomit.  Skin:    General: Skin is warm.  Neurological:     Mental Status: She is alert.     Comments: Patient is awake.  Has emesis basin.  Will look to me and answer some questions but overall not providing much history.      ED Treatments / Results  Labs (all labs ordered are listed, but only abnormal results are displayed) Labs Reviewed  RAPID URINE DRUG SCREEN, HOSP PERFORMED - Abnormal; Notable for the following components:      Result Value   Tetrahydrocannabinol POSITIVE (*)    All other components within normal limits  CBC WITH DIFFERENTIAL/PLATELET - Abnormal; Notable for the following components:   WBC 14.5 (*)    Neutro Abs 11.1 (*)    All other components within normal limits  COMPREHENSIVE METABOLIC PANEL - Abnormal; Notable for the following components:   Potassium 3.4 (*)    CO2 19 (*)    Glucose, Bld 123 (*)     Total Protein 8.3 (*)    Albumin 5.1 (*)    Anion gap 17 (*)    All other components within normal limits  LACTIC ACID, PLASMA - Abnormal; Notable for the following components:   Lactic Acid, Venous 2.0 (*)  All other components within normal limits  URINALYSIS, ROUTINE W REFLEX MICROSCOPIC - Abnormal; Notable for the following components:   APPearance CLOUDY (*)    Specific Gravity, Urine >1.030 (*)    Hgb urine dipstick TRACE (*)    Ketones, ur 40 (*)    Protein, ur 30 (*)    Nitrite POSITIVE (*)    All other components within normal limits  URINALYSIS, MICROSCOPIC (REFLEX) - Abnormal; Notable for the following components:   Bacteria, UA MANY (*)    All other components within normal limits  CBG MONITORING, ED - Abnormal; Notable for the following components:   Glucose-Capillary 124 (*)    All other components within normal limits  URINE CULTURE  SARS CORONAVIRUS 2 (TAT 6-24 HRS)  LIPASE, BLOOD  ETHANOL  I-STAT BETA HCG BLOOD, ED ([facility], [facility], [facility] ONLY)    EKG EKG Interpretation  Date/Time:  Wednesday January 09 2019 09:07:49 EDT Ventricular Rate:  73 PR Interval:    QRS Duration: 90 QT Interval:  360 QTC Calculation: 397 R Axis:   73 Text Interpretation:  Sinus rhythm Confirmed by Davonna Belling [phone]) on 01/09/2019 9:35:33 AM   Radiology No results found.  Procedures Procedures (including critical care time)  Medications Ordered in ED Medications  sodium chloride 0.9 % bolus 1,000 mL (0 mLs Intravenous Stopped 01/09/19 0930)  metoCLOPramide (REGLAN) injection 10 mg (10 mg Intravenous Given 01/09/19 0928)  cefTRIAXone (ROCEPHIN) 1 g in sodium chloride 0.9 % 100 mL IVPB (0 g Intravenous Stopped 01/09/19 1309)  iohexol (OMNIPAQUE) 300 MG/ML solution 100 mL (100 mLs Intravenous Contrast Given 01/09/19 1414)     Initial Impression / Assessment and Plan / ED Course  I have reviewed the triage vital signs and the nursing notes.  Pertinent labs & imaging results  that were available during my care of the patient were reviewed by me and considered in my medical decision making (see chart for details).        Patient presented with nausea vomiting.  Abdominal pain.  Confusion.  Had initially said she is pregnant but negative pregnancy test.  White count is elevated.  Has had some vomiting here.  Patient was initially refusing to stay for further after the CT scan was done but not read.  Discussed with Dr. Jasmine December from radiology.  Thinks we need rectal contrast due to fluid and air-filled area and rectal area.  Patient has not had rectal pain but think we need to further evaluate this.  Has nitrite and bacteria in the urine without white cells.  Care will be turned over to Dr. Billy Fischer. I would want to keep patient n.p.o. since this could be abscess, however patient is refusing anything else unless she gets some water.  Final Clinical Impressions(s) / ED Diagnoses   Final diagnoses:  Abdominal pain, unspecified abdominal location  Nausea and vomiting, intractability of vomiting not specified, unspecified vomiting type    ED Discharge Orders    None       Davonna Belling, MD 01/09/19 1542    Davonna Belling, MD 01/09/19 [phone]

## 2019-01-10 LAB — SARS CORONAVIRUS 2 (TAT 6-24 HRS): SARS Coronavirus 2: NEGATIVE

## 2019-01-11 ENCOUNTER — Emergency Department (HOSPITAL_COMMUNITY)
Admission: EM | Admit: 2019-01-11 | Discharge: 2019-01-11 | Discharge status: Against Medical Advice | Payer: Medicaid Other | Attending: Emergency Medicine | Admitting: Emergency Medicine

## 2019-01-11 ENCOUNTER — Encounter (HOSPITAL_COMMUNITY): Payer: Self-pay

## 2019-01-11 ENCOUNTER — Other Ambulatory Visit: Payer: Self-pay

## 2019-01-11 DIAGNOSIS — Z5321 Procedure and treatment not carried out due to patient leaving prior to being seen by health care provider: Secondary | ICD-10-CM | POA: Diagnosis not present

## 2019-01-11 DIAGNOSIS — R1110 Vomiting, unspecified: Secondary | ICD-10-CM | POA: Diagnosis not present

## 2019-01-11 LAB — URINE CULTURE: Culture: >=100000 — AB

## 2019-01-11 NOTE — ED Triage Notes (Signed)
Pt continuously asking if a room is available, then tells NT that she's just going to leave

## 2019-01-12 ENCOUNTER — Telehealth: Payer: Self-pay | Admitting: Emergency Medicine

## 2019-01-12 NOTE — Telephone Encounter (Signed)
Post ED Visit - Positive Culture Follow-up  Culture report reviewed by antimicrobial stewardship pharmacist: [place] Team []  Elenor Quinones, Pharm.D. []  Heide Guile, Pharm.D., BCPS AQ-ID []  Parks Neptune, Pharm.D., BCPS []  Alycia Rossetti, Pharm.D., BCPS []  Wilder, Pharm.D., BCPS, AAHIVP []  Legrand Como, Pharm.D., BCPS, AAHIVP []  Salome Arnt, PharmD, BCPS []  Johnnette Gourd, PharmD, BCPS [x]  Hughes Better, PharmD, BCPS []  Leeroy Cha, PharmD []  Laqueta Linden, PharmD, BCPS []  Albertina Parr, PharmD  [place] Team []  Leodis Sias, PharmD []  Lindell Spar, PharmD []  Royetta Asal, PharmD []  Graylin Shiver, Rph []  Rema Fendt) Glennon Mac, PharmD []  Arlyn Dunning, PharmD []  Netta Cedars, PharmD []  Dia Sitter, PharmD []  Leone Haven, PharmD []  Gretta Arab, PharmD []  Theodis Shove, PharmD []  Peggyann Juba, PharmD []  Reuel Boom, PharmD   Positive urine culture Treated with Cephalexin, organism sensitive to the same and no further patient follow-up is required at this time.  Sandi Raveling Kelsie Zaborowski 01/12/2019, 5:27 PM

## 2019-02-16 ENCOUNTER — Emergency Department (HOSPITAL_COMMUNITY): Payer: Medicaid Other

## 2019-02-16 ENCOUNTER — Inpatient Hospital Stay (HOSPITAL_COMMUNITY)
Admission: EM | Admit: 2019-02-16 | Discharge: 2019-02-18 | DRG: 392 | Disposition: A | Discharge status: Home / Self Care | Payer: Medicaid Other | Attending: Internal Medicine | Admitting: Internal Medicine

## 2019-02-16 DIAGNOSIS — N3000 Acute cystitis without hematuria: Secondary | ICD-10-CM | POA: Diagnosis not present

## 2019-02-16 DIAGNOSIS — Z20828 Contact with and (suspected) exposure to other viral communicable diseases: Secondary | ICD-10-CM | POA: Diagnosis present

## 2019-02-16 DIAGNOSIS — F1910 Other psychoactive substance abuse, uncomplicated: Secondary | ICD-10-CM

## 2019-02-16 DIAGNOSIS — K529 Noninfective gastroenteritis and colitis, unspecified: Secondary | ICD-10-CM

## 2019-02-16 DIAGNOSIS — Z8619 Personal history of other infectious and parasitic diseases: Secondary | ICD-10-CM | POA: Diagnosis present

## 2019-02-16 DIAGNOSIS — J45909 Unspecified asthma, uncomplicated: Secondary | ICD-10-CM | POA: Diagnosis present

## 2019-02-16 DIAGNOSIS — K861 Other chronic pancreatitis: Secondary | ICD-10-CM | POA: Diagnosis present

## 2019-02-16 DIAGNOSIS — R112 Nausea with vomiting, unspecified: Principal | ICD-10-CM | POA: Diagnosis present

## 2019-02-16 DIAGNOSIS — R1115 Cyclical vomiting syndrome unrelated to migraine: Secondary | ICD-10-CM

## 2019-02-16 DIAGNOSIS — N390 Urinary tract infection, site not specified: Secondary | ICD-10-CM | POA: Diagnosis present

## 2019-02-16 DIAGNOSIS — F1210 Cannabis abuse, uncomplicated: Secondary | ICD-10-CM | POA: Diagnosis present

## 2019-02-16 DIAGNOSIS — R000 Tachycardia, unspecified: Secondary | ICD-10-CM | POA: Diagnosis not present

## 2019-02-16 DIAGNOSIS — K5130 Ulcerative (chronic) rectosigmoiditis without complications: Secondary | ICD-10-CM | POA: Diagnosis present

## 2019-02-16 DIAGNOSIS — F1110 Opioid abuse, uncomplicated: Secondary | ICD-10-CM | POA: Diagnosis present

## 2019-02-16 DIAGNOSIS — R1110 Vomiting, unspecified: Secondary | ICD-10-CM | POA: Diagnosis not present

## 2019-02-16 DIAGNOSIS — F17210 Nicotine dependence, cigarettes, uncomplicated: Secondary | ICD-10-CM | POA: Diagnosis present

## 2019-02-16 DIAGNOSIS — G40909 Epilepsy, unspecified, not intractable, without status epilepticus: Secondary | ICD-10-CM

## 2019-02-16 LAB — URINALYSIS, ROUTINE W REFLEX MICROSCOPIC
Bilirubin Urine: NEGATIVE
Glucose, UA: NEGATIVE mg/dL
Hgb urine dipstick: NEGATIVE
Ketones, ur: 20 mg/dL — AB
Leukocytes,Ua: NEGATIVE
Nitrite: POSITIVE — AB
Protein, ur: NEGATIVE mg/dL
Specific Gravity, Urine: >1.046 — ABNORMAL HIGH (ref 1.005–1.030)
pH: 6 (ref 5.0–8.0)

## 2019-02-16 LAB — CBC WITH DIFFERENTIAL/PLATELET
Abs Immature Granulocytes: 0.02 10*3/uL (ref 0.00–0.07)
Basophils Absolute: 0 10*3/uL (ref 0.0–0.1)
Basophils Relative: 0 %
Eosinophils Absolute: 0 10*3/uL (ref 0.0–0.5)
Eosinophils Relative: 0 %
HCT: 38.9 % (ref 36.0–46.0)
Hemoglobin: 13.1 g/dL (ref 12.0–15.0)
Immature Granulocytes: 0 %
Lymphocytes Relative: 22 %
Lymphs Abs: 1.4 10*3/uL (ref 0.7–4.0)
MCH: 32.5 pg (ref 26.0–34.0)
MCHC: 33.7 g/dL (ref 30.0–36.0)
MCV: 96.5 fL (ref 80.0–100.0)
Monocytes Absolute: 0.3 10*3/uL (ref 0.1–1.0)
Monocytes Relative: 4 %
Neutro Abs: 4.9 10*3/uL (ref 1.7–7.7)
Neutrophils Relative %: 74 %
Platelets: 368 10*3/uL (ref 150–400)
RBC: 4.03 MIL/uL (ref 3.87–5.11)
RDW: 13 % (ref 11.5–15.5)
WBC: 6.6 10*3/uL (ref 4.0–10.5)
nRBC: 0 % (ref 0.0–0.2)

## 2019-02-16 LAB — COMPREHENSIVE METABOLIC PANEL
ALT: 17 U/L (ref 0–44)
AST: 20 U/L (ref 15–41)
Albumin: 4.1 g/dL (ref 3.5–5.0)
Alkaline Phosphatase: 58 U/L (ref 38–126)
Anion gap: 11 (ref 5–15)
BUN: 7 mg/dL (ref 6–20)
CO2: 19 mmol/L — ABNORMAL LOW (ref 22–32)
Calcium: 9.5 mg/dL (ref 8.9–10.3)
Chloride: 112 mmol/L — ABNORMAL HIGH (ref 98–111)
Creatinine, Ser: 0.67 mg/dL (ref 0.44–1.00)
GFR calc Af Amer: >60 mL/min (ref >60)
GFR calc non Af Amer: >60 mL/min (ref >60)
Glucose, Bld: 122 mg/dL — ABNORMAL HIGH (ref 70–99)
Potassium: 3.5 mmol/L (ref 3.5–5.1)
Sodium: 142 mmol/L (ref 135–145)
Total Bilirubin: 0.3 mg/dL (ref 0.3–1.2)
Total Protein: 7.2 g/dL (ref 6.5–8.1)

## 2019-02-16 LAB — ACETAMINOPHEN LEVEL: Acetaminophen (Tylenol), Serum: <10 ug/mL — ABNORMAL LOW (ref 10–30)

## 2019-02-16 LAB — RAPID URINE DRUG SCREEN, HOSP PERFORMED
Amphetamines: NOT DETECTED
Barbiturates: NOT DETECTED
Benzodiazepines: NOT DETECTED
Cocaine: NOT DETECTED
Opiates: POSITIVE — AB
Tetrahydrocannabinol: POSITIVE — AB

## 2019-02-16 LAB — I-STAT BETA HCG BLOOD, ED (MC, WL, AP ONLY): I-stat hCG, quantitative: <5 m[IU]/mL (ref <5)

## 2019-02-16 LAB — SALICYLATE LEVEL: Salicylate Lvl: <7 mg/dL (ref 2.8–30.0)

## 2019-02-16 LAB — ETHANOL: Alcohol, Ethyl (B): <10 mg/dL (ref <10)

## 2019-02-16 LAB — LIPASE, BLOOD: Lipase: 87 U/L — ABNORMAL HIGH (ref 11–51)

## 2019-02-16 MED ORDER — DICYCLOMINE HCL 20 MG PO TABS
20.0000 mg | ORAL_TABLET | Freq: Two times a day (BID) | ORAL | Status: DC
  Administered 2019-02-16 – 2019-02-18 (×4): 20 mg via ORAL
  Filled 2019-02-16 (×7): qty 1

## 2019-02-16 MED ORDER — SODIUM CHLORIDE 0.9 % IV SOLN
1.0000 g | Freq: Once | INTRAVENOUS | Status: AC
  Administered 2019-02-16: 1 g via INTRAVENOUS
  Filled 2019-02-16: qty 10

## 2019-02-16 MED ORDER — ONDANSETRON HCL 4 MG/2ML IJ SOLN
4.0000 mg | Freq: Once | INTRAMUSCULAR | Status: AC
  Administered 2019-02-16: 4 mg via INTRAVENOUS
  Filled 2019-02-16: qty 2

## 2019-02-16 MED ORDER — SODIUM CHLORIDE 0.9 % IV BOLUS
1000.0000 mL | Freq: Once | INTRAVENOUS | Status: AC
  Administered 2019-02-16: 1000 mL via INTRAVENOUS

## 2019-02-16 MED ORDER — HYDROCODONE-ACETAMINOPHEN 5-325 MG PO TABS
1.0000 | ORAL_TABLET | ORAL | Status: DC | PRN
  Administered 2019-02-18: 2 via ORAL
  Filled 2019-02-16: qty 2

## 2019-02-16 MED ORDER — PROMETHAZINE HCL 25 MG/ML IJ SOLN
25.0000 mg | Freq: Once | INTRAMUSCULAR | Status: AC
  Administered 2019-02-16: 25 mg via INTRAVENOUS
  Filled 2019-02-16: qty 1

## 2019-02-16 MED ORDER — DEXTROSE-NACL 5-0.45 % IV SOLN
INTRAVENOUS | Status: DC
  Administered 2019-02-16 – 2019-02-18 (×4): via INTRAVENOUS

## 2019-02-16 MED ORDER — ALBUTEROL SULFATE (2.5 MG/3ML) 0.083% IN NEBU: 3.0000 mL | INHALATION_SOLUTION | Freq: Four times a day (QID) | RESPIRATORY_TRACT | Status: DC | PRN

## 2019-02-16 MED ORDER — SODIUM CHLORIDE 0.9 % IV SOLN
1.0000 g | INTRAVENOUS | Status: DC
  Administered 2019-02-17 – 2019-02-18 (×2): 1 g via INTRAVENOUS
  Filled 2019-02-16 (×2): qty 1

## 2019-02-16 MED ORDER — LORAZEPAM 2 MG/ML IJ SOLN
0.5000 mg | Freq: Four times a day (QID) | INTRAMUSCULAR | Status: DC | PRN
  Administered 2019-02-16 – 2019-02-17 (×5): 0.5 mg via INTRAVENOUS
  Filled 2019-02-16 (×6): qty 1

## 2019-02-16 MED ORDER — ENOXAPARIN SODIUM 40 MG/0.4ML ~~LOC~~ SOLN
40.0000 mg | SUBCUTANEOUS | Status: DC
  Filled 2019-02-16 (×2): qty 0.4

## 2019-02-16 MED ORDER — ACETAMINOPHEN 325 MG PO TABS
650.0000 mg | ORAL_TABLET | Freq: Four times a day (QID) | ORAL | Status: DC | PRN
  Administered 2019-02-17: 650 mg via ORAL
  Filled 2019-02-16: qty 2

## 2019-02-16 MED ORDER — ACETAMINOPHEN 650 MG RE SUPP: 650.0000 mg | Freq: Four times a day (QID) | RECTAL | Status: DC | PRN

## 2019-02-16 MED ORDER — PROMETHAZINE HCL 25 MG/ML IJ SOLN: 12.5000 mg | Freq: Once | INTRAMUSCULAR | Status: DC

## 2019-02-16 MED ORDER — METOCLOPRAMIDE HCL 5 MG/ML IJ SOLN
10.0000 mg | Freq: Three times a day (TID) | INTRAMUSCULAR | Status: DC
  Administered 2019-02-16 – 2019-02-18 (×6): 10 mg via INTRAVENOUS
  Filled 2019-02-16 (×7): qty 2

## 2019-02-16 MED ORDER — ONDANSETRON HCL 4 MG PO TABS: 4.0000 mg | ORAL_TABLET | Freq: Four times a day (QID) | ORAL | Status: DC | PRN

## 2019-02-16 MED ORDER — IOHEXOL 300 MG/ML  SOLN
100.0000 mL | Freq: Once | INTRAMUSCULAR | Status: AC | PRN
  Administered 2019-02-16: 100 mL via INTRAVENOUS

## 2019-02-16 MED ORDER — HALOPERIDOL LACTATE 5 MG/ML IJ SOLN
2.0000 mg | Freq: Once | INTRAMUSCULAR | Status: AC
  Administered 2019-02-16: 2 mg via INTRAMUSCULAR
  Filled 2019-02-16: qty 1

## 2019-02-16 MED ORDER — HYDROCODONE-ACETAMINOPHEN 5-325 MG PO TABS
1.0000 | ORAL_TABLET | Freq: Once | ORAL | Status: AC
  Administered 2019-02-16: 1 via ORAL
  Filled 2019-02-16: qty 1

## 2019-02-16 MED ORDER — HYDROMORPHONE HCL 1 MG/ML IJ SOLN: 0.5000 mg | INTRAMUSCULAR | Status: DC | PRN

## 2019-02-16 MED ORDER — ENOXAPARIN SODIUM 40 MG/0.4ML ~~LOC~~ SOLN: 40.0000 mg | SUBCUTANEOUS | Status: DC

## 2019-02-16 MED ORDER — ONDANSETRON HCL 4 MG/2ML IJ SOLN
4.0000 mg | Freq: Four times a day (QID) | INTRAMUSCULAR | Status: DC | PRN
  Administered 2019-02-17: 4 mg via INTRAVENOUS
  Filled 2019-02-16: qty 2

## 2019-02-16 NOTE — ED Notes (Addendum)
Pt had one episode of vomiting at this time.

## 2019-02-16 NOTE — ED Triage Notes (Signed)
Pt presents with abdominal pain and vomiting that started this morning. Pt also reports some diarrhea that started yesterday.

## 2019-02-16 NOTE — ED Notes (Signed)
This tech is unable to obtain an EKG at this time due to patient tearing off leads and not allowing this tech to put them back on.

## 2019-02-16 NOTE — ED Provider Notes (Signed)
[facility] EMERGENCY DEPARTMENT Provider Note   CSN: [id number] Arrival date & time: 02/16/19  0957    History   Chief Complaint Chief Complaint  Patient presents with  . Emesis    HPI Janet Dixon is a 33 y.o. female with past medical history significant for polysubstance abuse who presents for evaluation of emesis.  Patient is uncooperative on exam and does not provide any information.  Patient with active emesis.  When asked if she took any substances patient just stares.  Denies that she has chest pain or shortness of breath she shakes her head no.  Refuses to answer any additional questions.  Level 5 caveat- AMS     HPI  Past Medical History:  Diagnosis Date  . Asthma   . History of chlamydia   . History of gonorrhea   . History of trichomoniasis   . Seizures [facility])     Patient Active Problem List   Diagnosis Date Noted  . Abdominal pain   . Urinary tract infection without hematuria   . Intractable vomiting   . Pyelonephritis 08/04/2018  . Status post repeat low transverse cesarean section 12/09/2017  . Drug use affecting pregnancy 06/08/2017  . Elevated lipase 04/14/2017  . Stomach pain 04/14/2017  . History of C-section 04/14/2017  . Hematuria 12/07/2016  . History of chlamydia 12/07/2016  . Papanicolaou smear of cervix with positive high risk human papilloma virus (HPV) test 09/07/2016  . Trichimoniasis 08/29/2016  . Seizure disorder ([place]) 05/01/2014  . Cocaine abuse ([place]) 03/14/2014    Past Surgical History:  Procedure Laterality Date  . CESAREAN SECTION    . CESAREAN SECTION N/A 12/02/2014   Procedure: REPEAT CESAREAN SECTION;  Surgeon: Jonnie Kind, MD;  Location: [place] [facility];  Service: Obstetrics;  Laterality: N/A;  . CESAREAN SECTION N/A 12/09/2017   Procedure: CESAREAN SECTION;  Surgeon: Jonnie Kind, MD;  Location: [place];  Service: Obstetrics;  Laterality: N/A;     OB History    Gravida  8   Para  7   Term  7   Preterm      AB  0   Living  7     SAB  0   TAB      Ectopic      Multiple  0   Live Births  7            Home Medications    Prior to Admission medications   Medication Sig Start Date End Date Taking? Authorizing Provider  albuterol (PROVENTIL HFA;VENTOLIN HFA) 108 (90 Base) MCG/ACT inhaler Inhale 2 puffs into the lungs every 6 (six) hours as needed for wheezing or shortness of breath. Patient not taking: Reported on 11/08/2018 08/29/16   Estill Dooms, NP  dicyclomine (BENTYL) 20 MG tablet Take 1 tablet (20 mg total) by mouth 2 (two) times daily. Patient not taking: Reported on 02/16/2019 01/09/19   Gareth Morgan, MD  ondansetron (ZOFRAN ODT) 4 MG disintegrating tablet Take 1 tablet (4 mg total) by mouth every 8 (eight) hours as needed for nausea or vomiting. Patient not taking: Reported on 02/16/2019 01/09/19   Gareth Morgan, MD  Prenatal Vit-Fe Fumarate-FA (PNV PRENATAL PLUS MULTIVITAMIN) 27-1 MG TABS Take 1 tablet by mouth daily. Patient not taking: Reported on 11/08/2018 08/30/17   Cresenzo-Dishmon, Joaquim Lai, CNM  potassium chloride (K-DUR) 10 MEQ tablet Take 1 tablet (10 mEq total) by mouth 2 (two) times daily for 3 days. Patient not taking:  Reported on 11/08/2018 08/05/18 01/09/19  [place] Bing, DO    Family History Family History  Problem Relation Age of Onset  . Hypertension Mother   . Asthma Brother   . Asthma Daughter   . Eczema Daughter   . Asthma Son   . Eczema Son   . Aneurysm Maternal Grandmother   . Heart disease Maternal Grandmother   . Cancer Maternal Grandmother   . Asthma Daughter   . Eczema Daughter   . Asthma Son   . Eczema Son   . Eczema Son   . Cancer Maternal Grandfather        colon  . Eczema Maternal Aunt     Social History Social History   Tobacco Use  . Smoking status: Current Every Day Smoker    Packs/day: 0.50    Years: 13.00    Pack years: 6.50    Types: Cigarettes  . Smokeless tobacco: Never Used   . Tobacco comment: 3 per day  Substance Use Topics  . Alcohol use: No    Comment: none x 1 month ago  . Drug use: Yes    Types: Marijuana, Cocaine    Comment: states its been almost a year     Allergies   Macrobid [nitrofurantoin macrocrystal], Diphenhydramine hcl, Ibuprofen, and Flexeril [cyclobenzaprine hcl]  Review of Systems Review of Systems  Unable to perform ROS: Mental status change   Physical Exam Updated Vital Signs BP 116/78   Pulse 93   Temp 98 F (36.7 C) (Axillary)   Resp 14   SpO2 100%   Physical Exam Vitals signs and nursing note reviewed.  Constitutional:      General: She is not in acute distress.    Appearance: She is well-developed. She is not ill-appearing or toxic-appearing.     Comments: Active emesis on exam. NBNB  HENT:     Head: Atraumatic.     Nose: Nose normal.     Mouth/Throat:     Mouth: Mucous membranes are dry.  Eyes:     Pupils: Pupils are equal, round, and reactive to light.  Neck:     Musculoskeletal: Normal range of motion.  Cardiovascular:     Rate and Rhythm: Normal rate.     Pulses: Normal pulses.     Heart sounds: Normal heart sounds.  Pulmonary:     Effort: Pulmonary effort is normal. No respiratory distress.     Breath sounds: Normal breath sounds.  Abdominal:     General: There is no distension.     Comments: Difficult exam as patient is pushing away my hand.  Musculoskeletal: Normal range of motion.     Comments: Moves all 4 extremities spontaneously.  Skin:    General: Skin is warm and dry.     Capillary Refill: Capillary refill takes less than 2 seconds.  Neurological:     Mental Status: She is alert.     Comments: Patient is awake and alert.  She answers some questions however not all.    ED Treatments / Results  Labs (all labs ordered are listed, but only abnormal results are displayed) Labs Reviewed  COMPREHENSIVE METABOLIC PANEL - Abnormal; Notable for the following components:      Result Value    Chloride 112 (*)    CO2 19 (*)    Glucose, Bld 122 (*)    All other components within normal limits  LIPASE, BLOOD - Abnormal; Notable for the following components:   Lipase 87 (*)  All other components within normal limits  RAPID URINE DRUG SCREEN, HOSP PERFORMED - Abnormal; Notable for the following components:   Opiates POSITIVE (*)    Tetrahydrocannabinol POSITIVE (*)    All other components within normal limits  URINALYSIS, ROUTINE W REFLEX MICROSCOPIC - Abnormal; Notable for the following components:   APPearance HAZY (*)    Specific Gravity, Urine >1.046 (*)    Ketones, ur 20 (*)    Nitrite POSITIVE (*)    Bacteria, UA MANY (*)    All other components within normal limits  ACETAMINOPHEN LEVEL - Abnormal; Notable for the following components:   Acetaminophen (Tylenol), Serum <10 (*)    All other components within normal limits  CBC WITH DIFFERENTIAL/PLATELET  SALICYLATE LEVEL  ETHANOL  I-STAT BETA HCG BLOOD, ED ([facility], [facility], [facility] ONLY)    EKG None  Radiology Ct Abdomen Pelvis W Contrast  Result Date: 02/16/2019 CLINICAL DATA:  Acute abdominal pain and vomiting. EXAM: CT ABDOMEN AND PELVIS WITH CONTRAST TECHNIQUE: Multidetector CT imaging of the abdomen and pelvis was performed using the standard protocol following bolus administration of intravenous contrast. CONTRAST:  161m OMNIPAQUE IOHEXOL 300 MG/ML  SOLN COMPARISON:  CT abdomen pelvis dated January 09, 2019. FINDINGS: Lower chest: No acute abnormality. Hepatobiliary: No focal liver abnormality is seen. No gallstones, gallbladder wall thickening, or biliary dilatation. Pancreas: Unremarkable. No pancreatic ductal dilatation or surrounding inflammatory changes. Spleen: Normal in size without focal abnormality. Adrenals/Urinary Tract: Adrenal glands are unremarkable. Unchanged mild right renal scarring. The kidneys are otherwise normal, without renal calculi, focal lesion, or hydronephrosis. Bladder is unremarkable.  Stomach/Bowel: Stomach is within normal limits. Appendix is not discretely visualized, but there are no signs of inflammation at the base of the cecum. Mild wall thickening of the distal sigmoid colon and rectum. No obstruction. Vascular/Lymphatic: No significant vascular findings are present. No enlarged abdominal or pelvic lymph nodes. Reproductive: Uterus and bilateral adnexa are unremarkable. Other: Trace free fluid in the pelvis, likely physiologic. No pneumoperitoneum. Musculoskeletal: No acute or significant osseous findings. IMPRESSION: 1. Mild proctocolitis. Electronically Signed   By: WTitus DubinM.D.   On: 02/16/2019 12:56    Procedures Procedures (including critical care time)  Medications Ordered in ED Medications  sodium chloride 0.9 % bolus 1,000 mL (0 mLs Intravenous Stopped 02/16/19 1406)  ondansetron (ZOFRAN) injection 4 mg (4 mg Intravenous Given 02/16/19 1024)  haloperidol lactate (HALDOL) injection 2 mg (2 mg Intramuscular Given 02/16/19 1106)  iohexol (OMNIPAQUE) 300 MG/ML solution 100 mL (100 mLs Intravenous Contrast Given 02/16/19 1228)  cefTRIAXone (ROCEPHIN) 1 g in sodium chloride 0.9 % 100 mL IVPB (0 g Intravenous Stopped 02/16/19 1454)  promethazine (PHENERGAN) injection 25 mg (25 mg Intravenous Given 02/16/19 1519)   Initial Impression / Assessment and Plan / ED Course  I have reviewed the triage vital signs and the nursing notes.  Pertinent labs & imaging results that were available during my care of the patient were reviewed by me and considered in my medical decision making (see chart for details).  [age]year old presents for evaluation of emesis.  Patient not cooperative with exam is unable to obtain history or physical exam.  She has been ambulatory in room.  She has nonbloody, nonbilious emesis.  She does have similar presentations in the past.  Admits to polysubstance use. No evidence of head injury on exam.  [facility] leukocytosis[facility] with  elevated glucose at 122, lipase elevated at 87, ethanol, salicylate, acetaminophen level negative,  pregnancy test negative  1100: Nursing has informed me that patient keeps trying to get out of bed.  Will order Haldol for her persistent emesis as well as her agitation.  1122: Patient urinated and defecated in the corner of the room.  1330: Patient is now more alert and oriented.  States she has had generalized abdominal pain as well as nausea and vomiting.  Discussed with patient results of lab work.  She does have a mildly elevated lipase at 87 which she does have history of.  Elevation of LFTs and negative Murphy sign I have low suspicion for choledocholithiasis as cause of this.  Her urine is positive for UTI.  Her CT scan does show possible evidence of proctocolitis.  She denies any diarrhea.  We will plan to p.o. challenge.  UDS positive for THC and opiates. Possible degree of cannabinoid hyperemesis as cause of her emesis? Feel AMS on arrival was likely due to substance use. Given non focal exam on reevaluation and able to walk to bathroom without ataxic gait, low suspicion for acute head injury.  1500: Patient with episode of nonbloody, nonbilious emesis after p.o. challenge.  Will give additional Phenergan and reevaluate.  1600: Patient care transferred to [facility], PA-C who will follow up on PO challenge. She will determine ultimate treatment, plan and disposition of patient.  If patient able to tolerate p.o. challenge recommending sending home on Cipro, Flagyl, Zofran.  The Cipro and Flagyl will cover for infection from a possible proctocolitis however the Cipro will also cover for her UTI. If unable to tolerate PO challenge could give additional antiemetics vs inpatient management for intractable emesis.       Final Clinical Impressions(s) / ED Diagnoses   Final diagnoses:  Cyclical vomiting syndrome not associated with migraine  Polysubstance abuse ([place])  Acute cystitis without  hematuria  Proctocolitis    ED Discharge Orders    None       Paula Zietz A, PA-C 02/16/19 1630    Tegeler, Gwenyth Allegra, MD 02/17/19 0830

## 2019-02-16 NOTE — ED Provider Notes (Signed)
Care assumed from PA Henderly at shift change. Please see her note for full HPI.  In short, patient presents for evaluation of numerous episodes of emesis. Informed multiple times by nurse that patient has vomited. Patient is uncooperative on exam and will not provide me with any information. Through chart review, it appears patient has been seen numerous times for similar issues.  Level 5 caveat: Patient uncooperative, possible substance abuse Physical Exam  BP 119/87   Pulse 87   Temp 98 F (36.7 C) (Axillary)   Resp 14   SpO2 100%   Physical Exam Vitals signs and nursing note reviewed.  Constitutional:      General: She is not in acute distress.    Comments: Sleeping in bed. Able to be aroused.   HENT:     Head: Normocephalic and atraumatic.  Eyes:     Conjunctiva/sclera: Conjunctivae normal.  Neck:     Musculoskeletal: Neck supple.  Cardiovascular:     Rate and Rhythm: Normal rate and regular rhythm.     Pulses: Normal pulses.     Heart sounds: Normal heart sounds. No murmur. No friction rub. No gallop.   Pulmonary:     Effort: Pulmonary effort is normal.     Breath sounds: Normal breath sounds.  Abdominal:     General: Abdomen is flat. There is no distension.     Palpations: Abdomen is soft.     Tenderness: There is no abdominal tenderness. There is no guarding.     Comments: Soft, non-tender, non-distended abdomen. Difficult to assess due patient being uncooperative.  Skin:    General: Skin is warm.     ED Course/Procedures     Procedures  MDM   5:13 PM Informed by nurse patient had another episode of emesis. Repeat exam. Patient has soft, non-distended, non-tender abdomen. Will consult unassigned medicine to admit patient given she failed po challenge and has not improved with phenergan, Zofran, and Haldol.   Spoke to Dr. Laren Everts who agrees to evaluate and admit patient    Romie Levee 02/17/19 Annita Brod, MD 02/17/19 1100

## 2019-02-16 NOTE — ED Notes (Signed)
Pt continues to refuse to wear EKG monitor.

## 2019-02-16 NOTE — ED Notes (Signed)
Pt continues to refuse wear EKG leads. Pt pulling wires off at this time. Unable to obtain EKG.

## 2019-02-16 NOTE — ED Notes (Signed)
Pt pulled out her IV. Site clean, dry and intact. Dressing applied by this RN.

## 2019-02-16 NOTE — ED Notes (Signed)
Patient transported to CT 

## 2019-02-16 NOTE — ED Notes (Signed)
Pt grabbing staff's arms and hands stating "I need to hold your hand" staff attempting to hook patient up to monitoring equipment and start and iv, patient being uncooperative at this time.

## 2019-02-16 NOTE — ED Notes (Signed)
Pt uncooperative, pulling EKG wires off of her. Unable to obtain EKG at this time. PA made aware by this RN.

## 2019-02-16 NOTE — H&P (Signed)
[facility]                                                                                    Patient Demographics  Janet Dixon, is a 33 y.o. female  CSN: [id number]  MRN: [id number]  DOB - 05-Aug-1985  Admit Date - 02/16/2019  Outpatient Primary MD for the patient is Patient, No Pcp Per   With History of -  Past Medical History:  Diagnosis Date  . Asthma   . History of chlamydia   . History of gonorrhea   . History of trichomoniasis   . Seizures ([place])       Past Surgical History:  Procedure Laterality Date  . CESAREAN SECTION    . CESAREAN SECTION N/A 12/02/2014   Procedure: REPEAT CESAREAN SECTION;  Surgeon: Jonnie Kind, MD;  Location: [facility];  Service: Obstetrics;  Laterality: N/A;  . CESAREAN SECTION N/A 12/09/2017   Procedure: CESAREAN SECTION;  Surgeon: Jonnie Kind, MD;  Location: [facility];  Service: Obstetrics;  Laterality: N/A;    in for   Chief Complaint  Patient presents with  . Emesis     HPI  Janet Dixon  is a 33 y.o. female, with past medical history significant for polysubstance abuse, asthma, STDs who presented earlier today for intractable nausea and vomiting.  Patient complains of abdominal pain and asking to be admitted to have this under control.  Otherwise the patient does not provide any other history at this time. In the emergency room work-up showed slightly elevated lipase of abdomen pelvis showed mild proctocolitis.  This was positive and she is being treated for UTI with Rocephin.    Review of Systems    Unable to obtain due to patient's uncooperation   Social History Social History   Tobacco Use  . Smoking status: Current Every Day Smoker    Packs/day: 0.50    Years: 13.00    Pack years: 6.50    Types: Cigarettes  . Smokeless tobacco: Never Used  . Tobacco comment: 3 per day  Substance Use Topics  . Alcohol use: No    Comment: none x 1 month ago     Family History Family History   Problem Relation Age of Onset  . Hypertension Mother   . Asthma Brother   . Asthma Daughter   . Eczema Daughter   . Asthma Son   . Eczema Son   . Aneurysm Maternal Grandmother   . Heart disease Maternal Grandmother   . Cancer Maternal Grandmother   . Asthma Daughter   . Eczema Daughter   . Asthma Son   . Eczema Son   . Eczema Son   . Cancer Maternal Grandfather        colon  . Eczema Maternal Aunt      Prior to Admission medications   Medication Sig Start Date End Date Taking? Authorizing Provider  albuterol (PROVENTIL HFA;VENTOLIN HFA) 108 (90 Base) MCG/ACT inhaler Inhale 2 puffs into the lungs every 6 (six) hours as needed for wheezing or shortness of breath. Patient not taking: Reported on 11/08/2018 08/29/16   Estill Dooms, NP  dicyclomine (BENTYL)  20 MG tablet Take 1 tablet (20 mg total) by mouth 2 (two) times daily. Patient not taking: Reported on 02/16/2019 01/09/19   Gareth Morgan, MD  ondansetron (ZOFRAN ODT) 4 MG disintegrating tablet Take 1 tablet (4 mg total) by mouth every 8 (eight) hours as needed for nausea or vomiting. Patient not taking: Reported on 02/16/2019 01/09/19   Gareth Morgan, MD  Prenatal Vit-Fe Fumarate-FA (PNV PRENATAL PLUS MULTIVITAMIN) 27-1 MG TABS Take 1 tablet by mouth daily. Patient not taking: Reported on 11/08/2018 08/30/17   Cresenzo-Dishmon, Joaquim Lai, CNM  potassium chloride (K-DUR) 10 MEQ tablet Take 1 tablet (10 mEq total) by mouth 2 (two) times daily for 3 days. Patient not taking: Reported on 11/08/2018 08/05/18 01/09/19  [place] Bing, DO    Allergies  Allergen Reactions  . Macrobid [Nitrofurantoin Macrocrystal] Shortness Of Breath, Nausea And Vomiting and Other (See Comments)    dizziness  . Diphenhydramine Hcl Other (See Comments)    Unknown-patient states that she does take this medication  . Ibuprofen Other (See Comments)    Patient states that syncope has occurred after taking this medication  . Flexeril  [Cyclobenzaprine Hcl] Rash    Physical Exam  Vitals  Blood pressure 116/78, pulse 93, temperature 98 F (36.7 C), temperature source Axillary, resp. rate 14, SpO2 100 %, unknown if currently breastfeeding. General appearance looks tired, sleepy and wrapping herself in a blanket.  Well-developed emaciated HEENT no jaundice or pallor, no facial deviation Neck supple, no neck vein distention Chest clear and resonant Heart normal S1-S2, no murmurs gallops or rubs Abdomen mild generalized tenderness above the umbilicus Extremities no clubbing cyanosis or edema Neuro gross nonfocal, patient moving all extremities Skin warm, no rashes   Data Review  CBC Recent Labs  Lab 02/16/19 1032  WBC 6.6  HGB 13.1  HCT 38.9  PLT 368  MCV 96.5  MCH 32.5  MCHC 33.7  RDW 13.0  LYMPHSABS 1.4  MONOABS 0.3  EOSABS 0.0  BASOSABS 0.0   ------------------------------------------------------------------------------------------------------------------  Chemistries  Recent Labs  Lab 02/16/19 1032  NA 142  K 3.5  CL 112*  CO2 19*  GLUCOSE 122*  BUN 7  CREATININE 0.67  CALCIUM 9.5  AST 20  ALT 17  ALKPHOS 58  BILITOT 0.3   ------------------------------------------------------------------------------------------------------------------ CrCl cannot be calculated (Unknown ideal weight.). ------------------------------------------------------------------------------------------------------------------ No results for input(s): TSH, T4TOTAL, T3FREE, THYROIDAB in the last 72 hours.  Invalid input(s): FREET3   Coagulation profile No results for input(s): INR, PROTIME in the last 168 hours. ------------------------------------------------------------------------------------------------------------------- No results for input(s): DDIMER in the last 72 hours. -------------------------------------------------------------------------------------------------------------------  Cardiac  Enzymes No results for input(s): CKMB, TROPONINI, MYOGLOBIN in the last 168 hours.  Invalid input(s): CK ------------------------------------------------------------------------------------------------------------------ Invalid input(s): POCBNP   ---------------------------------------------------------------------------------------------------------------  Urinalysis    Component Value Date/Time   COLORURINE YELLOW 02/16/2019 1240   APPEARANCEUR HAZY (A) 02/16/2019 1240   APPEARANCEUR Turbid (A) 12/07/2016 1345   LABSPEC >1.046 (H) 02/16/2019 1240   LABSPEC 1.011 08/14/2012 1242   PHURINE 6.0 02/16/2019 1240   GLUCOSEU NEGATIVE 02/16/2019 1240   GLUCOSEU Negative 08/14/2012 1242   HGBUR NEGATIVE 02/16/2019 1240   BILIRUBINUR NEGATIVE 02/16/2019 1240   BILIRUBINUR Negative 12/07/2016 1345   BILIRUBINUR Negative 08/14/2012 1242   KETONESUR 20 (A) 02/16/2019 1240   PROTEINUR NEGATIVE 02/16/2019 1240   UROBILINOGEN 0.2 11/20/2014 1445   NITRITE POSITIVE (A) 02/16/2019 1240   LEUKOCYTESUR NEGATIVE 02/16/2019 1240   LEUKOCYTESUR Trace 08/14/2012 1242    ----------------------------------------------------------------------------------------------------------------  .  Imaging results:   Ct Abdomen Pelvis W Contrast  Result Date: 02/16/2019 CLINICAL DATA:  Acute abdominal pain and vomiting. EXAM: CT ABDOMEN AND PELVIS WITH CONTRAST TECHNIQUE: Multidetector CT imaging of the abdomen and pelvis was performed using the standard protocol following bolus administration of intravenous contrast. CONTRAST:  125m OMNIPAQUE IOHEXOL 300 MG/ML  SOLN COMPARISON:  CT abdomen pelvis dated January 09, 2019. FINDINGS: Lower chest: No acute abnormality. Hepatobiliary: No focal liver abnormality is seen. No gallstones, gallbladder wall thickening, or biliary dilatation. Pancreas: Unremarkable. No pancreatic ductal dilatation or surrounding inflammatory changes. Spleen: Normal in size without  focal abnormality. Adrenals/Urinary Tract: Adrenal glands are unremarkable. Unchanged mild right renal scarring. The kidneys are otherwise normal, without renal calculi, focal lesion, or hydronephrosis. Bladder is unremarkable. Stomach/Bowel: Stomach is within normal limits. Appendix is not discretely visualized, but there are no signs of inflammation at the base of the cecum. Mild wall thickening of the distal sigmoid colon and rectum. No obstruction. Vascular/Lymphatic: No significant vascular findings are present. No enlarged abdominal or pelvic lymph nodes. Reproductive: Uterus and bilateral adnexa are unremarkable. Other: Trace free fluid in the pelvis, likely physiologic. No pneumoperitoneum. Musculoskeletal: No acute or significant osseous findings. IMPRESSION: 1. Mild proctocolitis. Electronically Signed   By: WTitus DubinM.D.   On: 02/16/2019 12:56     Assessment & Plan  Intractable nausea and vomiting Start Reglan every 8 hours IV As needed Zofran and Ativan  UTI Continue with Rocephin  Pancreatitis, probably chronic IV Dilaudid as needed  Polysubstance abuse Monitor for withdrawal  Mild proctocolitis Observe  DVT Prophylaxis Lovenox  AM Labs Ordered, also please review Full Orders  Family Communication: called and left message .  Code Status full  Disposition Plan: home  Time spent in minutes : 35 min  Condition GUARDED   @SIGNATURE @

## 2019-02-16 NOTE — ED Notes (Addendum)
Pt had one episode of emesis since PO challenge.

## 2019-02-17 DIAGNOSIS — F1110 Opioid abuse, uncomplicated: Secondary | ICD-10-CM | POA: Diagnosis present

## 2019-02-17 DIAGNOSIS — R112 Nausea with vomiting, unspecified: Secondary | ICD-10-CM | POA: Diagnosis present

## 2019-02-17 DIAGNOSIS — Z20828 Contact with and (suspected) exposure to other viral communicable diseases: Secondary | ICD-10-CM | POA: Diagnosis present

## 2019-02-17 DIAGNOSIS — K861 Other chronic pancreatitis: Secondary | ICD-10-CM | POA: Diagnosis present

## 2019-02-17 DIAGNOSIS — K5130 Ulcerative (chronic) rectosigmoiditis without complications: Secondary | ICD-10-CM | POA: Diagnosis present

## 2019-02-17 DIAGNOSIS — G40909 Epilepsy, unspecified, not intractable, without status epilepticus: Secondary | ICD-10-CM | POA: Diagnosis present

## 2019-02-17 DIAGNOSIS — F1910 Other psychoactive substance abuse, uncomplicated: Secondary | ICD-10-CM | POA: Diagnosis not present

## 2019-02-17 DIAGNOSIS — F17210 Nicotine dependence, cigarettes, uncomplicated: Secondary | ICD-10-CM | POA: Diagnosis present

## 2019-02-17 DIAGNOSIS — R1115 Cyclical vomiting syndrome unrelated to migraine: Secondary | ICD-10-CM | POA: Diagnosis not present

## 2019-02-17 DIAGNOSIS — F1210 Cannabis abuse, uncomplicated: Secondary | ICD-10-CM | POA: Diagnosis present

## 2019-02-17 DIAGNOSIS — J45909 Unspecified asthma, uncomplicated: Secondary | ICD-10-CM | POA: Diagnosis present

## 2019-02-17 DIAGNOSIS — R1110 Vomiting, unspecified: Secondary | ICD-10-CM | POA: Diagnosis not present

## 2019-02-17 DIAGNOSIS — N3000 Acute cystitis without hematuria: Secondary | ICD-10-CM | POA: Diagnosis present

## 2019-02-17 LAB — SARS CORONAVIRUS 2 (TAT 6-24 HRS): SARS Coronavirus 2: NEGATIVE

## 2019-02-17 LAB — BASIC METABOLIC PANEL
Anion gap: 10 (ref 5–15)
BUN: 5 mg/dL — ABNORMAL LOW (ref 6–20)
CO2: 21 mmol/L — ABNORMAL LOW (ref 22–32)
Calcium: 8.9 mg/dL (ref 8.9–10.3)
Chloride: 110 mmol/L (ref 98–111)
Creatinine, Ser: 0.73 mg/dL (ref 0.44–1.00)
GFR calc Af Amer: >60 mL/min (ref >60)
GFR calc non Af Amer: >60 mL/min (ref >60)
Glucose, Bld: 114 mg/dL — ABNORMAL HIGH (ref 70–99)
Potassium: 3.2 mmol/L — ABNORMAL LOW (ref 3.5–5.1)
Sodium: 141 mmol/L (ref 135–145)

## 2019-02-17 LAB — HIV ANTIBODY (ROUTINE TESTING W REFLEX): HIV Screen 4th Generation wRfx: NONREACTIVE

## 2019-02-17 LAB — MAGNESIUM: Magnesium: 1.9 mg/dL (ref 1.7–2.4)

## 2019-02-17 MED ORDER — MORPHINE SULFATE (PF) 2 MG/ML IV SOLN
1.0000 mg | INTRAVENOUS | Status: DC | PRN
  Administered 2019-02-17 – 2019-02-18 (×3): 1 mg via INTRAVENOUS
  Filled 2019-02-17 (×3): qty 1

## 2019-02-17 NOTE — Progress Notes (Signed)
Progress Note    Janet Dixon  KGM:[id number] DOB: 1985/12/15  DOA: 02/16/2019 PCP: Patient, No Pcp Per    Brief Narrative:     Medical records reviewed and are as summarized below:  Janet Dixon is an 33 y.o. female  with past medical history significant for polysubstance abuse, asthma, STDs who presented earlier today for intractable nausea and vomiting.  Patient complains of abdominal pain and asking to be admitted to have this under control.  Otherwise the patient does not provide any other history at this time.  Assessment/Plan:   Active Problems:   Intractable nausea and vomiting   Intractable nausea and vomiting Start Reglan every 8 hours IV As needed Zofran and Ativan -multiple hospitalizations for same -clears for now  Hypokalemia -replete IV  UTI Continue with Rocephin -urine culture pending  Polysubstance abuse -suspect withdrawing from opiods  Mild proctocolitis -h/o STDs -send off G/C probe from urine-- may need rectal swab as well if negative   Family Communication/Anticipated D/C date and plan/Code Status   DVT prophylaxis: Lovenox ordered. Code Status: Full Code.  Family Communication:  Disposition Plan:    Medical Consultants:    None.   Subjective:   Moving around in bed- would not answer questions  Objective:    Vitals:   02/16/19 2005 02/17/19 0325 02/17/19 0532 02/17/19 0600  BP: 139/88 123/81 (!) 133/92   Pulse: 70 85 64   Resp: 18 18  18   Temp:  99.3 F (37.4 C) 98.7 F (37.1 C)   TempSrc:  Oral    SpO2:  100% 100%     Intake/Output Summary (Last 24 hours) at 02/17/2019 1156 Last data filed at 02/17/2019 0537 Gross per 24 hour  Intake 2037.63 ml  Output --  Net 2037.63 ml   There were no vitals filed for this visit.  Exam: In bed, laying on side, head wrapped in blanket, wig laying to the side Not cooperative for an exam  Data Reviewed:   I have personally reviewed following labs and imaging  studies:  Labs: Labs show the following:   Basic Metabolic Panel: Recent Labs  Lab 02/16/19 1032 02/17/19 0313  NA 142 141  K 3.5 3.2*  CL 112* 110  CO2 19* 21*  GLUCOSE 122* 114*  BUN 7 5*  CREATININE 0.67 0.73  CALCIUM 9.5 8.9   GFR CrCl cannot be calculated (Unknown ideal weight.). Liver Function Tests: Recent Labs  Lab 02/16/19 1032  AST 20  ALT 17  ALKPHOS 58  BILITOT 0.3  PROT 7.2  ALBUMIN 4.1   Recent Labs  Lab 02/16/19 1032  LIPASE 87*   No results for input(s): AMMONIA in the last 168 hours. Coagulation profile No results for input(s): INR, PROTIME in the last 168 hours.  CBC: Recent Labs  Lab 02/16/19 1032  WBC 6.6  NEUTROABS 4.9  HGB 13.1  HCT 38.9  MCV 96.5  PLT 368   Cardiac Enzymes: No results for input(s): CKTOTAL, CKMB, CKMBINDEX, TROPONINI in the last 168 hours. BNP (last 3 results) No results for input(s): PROBNP in the last 8760 hours. CBG: No results for input(s): GLUCAP in the last 168 hours. D-Dimer: No results for input(s): DDIMER in the last 72 hours. Hgb A1c: No results for input(s): HGBA1C in the last 72 hours. Lipid Profile: No results for input(s): CHOL, HDL, LDLCALC, TRIG, CHOLHDL, LDLDIRECT in the last 72 hours. Thyroid function studies: No results for input(s): TSH, T4TOTAL, T3FREE, THYROIDAB in the  last 72 hours.  Invalid input(s): FREET3 Anemia work up: No results for input(s): VITAMINB12, FOLATE, FERRITIN, TIBC, IRON, RETICCTPCT in the last 72 hours. Sepsis Labs: Recent Labs  Lab 02/16/19 1032  WBC 6.6    Microbiology Recent Results (from the past 240 hour(s))  SARS CORONAVIRUS 2 (TAT 6-24 HRS) Nasopharyngeal Nasopharyngeal Swab     Status: None   Collection Time: 02/16/19  6:38 PM   Specimen: Nasopharyngeal Swab  Result Value Ref Range Status   SARS Coronavirus 2 NEGATIVE NEGATIVE Final    Comment: (NOTE) SARS-CoV-2 target nucleic acids are NOT DETECTED. The SARS-CoV-2 RNA is generally detectable  in upper and lower respiratory specimens during the acute phase of infection. Negative results do not preclude SARS-CoV-2 infection, do not rule out co-infections with other pathogens, and should not be used as the sole basis for treatment or other patient management decisions. Negative results must be combined with clinical observations, patient history, and epidemiological information. The expected result is Negative. Fact Sheet for Patients: SugarRoll.be Fact Sheet for Healthcare Providers: https://www.woods-mathews.com/ This test is not yet approved or cleared by the [place] FDA and  has been authorized for detection and/or diagnosis of SARS-CoV-2 by FDA under an Emergency Use Authorization (EUA). This EUA will remain  in effect (meaning this test can be used) for the duration of the COVID-19 declaration under Section 56 4(b)(1) of the Act, 21 U.S.C. section 360bbb-3(b)(1), unless the authorization is terminated or revoked sooner. Performed at [facility], [place] [address], [place], [place] [address]     Procedures and diagnostic studies:  Ct Abdomen Pelvis W Contrast  Result Date: 02/16/2019 CLINICAL DATA:  Acute abdominal pain and vomiting. EXAM: CT ABDOMEN AND PELVIS WITH CONTRAST TECHNIQUE: Multidetector CT imaging of the abdomen and pelvis was performed using the standard protocol following bolus administration of intravenous contrast. CONTRAST:  168m OMNIPAQUE IOHEXOL 300 MG/ML  SOLN COMPARISON:  CT abdomen pelvis dated January 09, 2019. FINDINGS: Lower chest: No acute abnormality. Hepatobiliary: No focal liver abnormality is seen. No gallstones, gallbladder wall thickening, or biliary dilatation. Pancreas: Unremarkable. No pancreatic ductal dilatation or surrounding inflammatory changes. Spleen: Normal in size without focal abnormality. Adrenals/Urinary Tract: Adrenal glands are unremarkable. Unchanged mild right renal  scarring. The kidneys are otherwise normal, without renal calculi, focal lesion, or hydronephrosis. Bladder is unremarkable. Stomach/Bowel: Stomach is within normal limits. Appendix is not discretely visualized, but there are no signs of inflammation at the base of the cecum. Mild wall thickening of the distal sigmoid colon and rectum. No obstruction. Vascular/Lymphatic: No significant vascular findings are present. No enlarged abdominal or pelvic lymph nodes. Reproductive: Uterus and bilateral adnexa are unremarkable. Other: Trace free fluid in the pelvis, likely physiologic. No pneumoperitoneum. Musculoskeletal: No acute or significant osseous findings. IMPRESSION: 1. Mild proctocolitis. Electronically Signed   By: WTitus DubinM.D.   On: 02/16/2019 12:56    Medications:    dicyclomine  20 mg Oral BID   enoxaparin (LOVENOX) injection  40 mg Subcutaneous Q24H   metoCLOPramide (REGLAN) injection  10 mg Intravenous Q8H   Continuous Infusions:  cefTRIAXone (ROCEPHIN)  IV 1 g (02/17/19 1123)   dextrose 5 % and 0.45% NaCl 75 mL/hr at 02/17/19 0537     LOS: 0 days   JGeradine Girt [facility]   How to contact the [facility] TenayaAttending or Consulting provider 7[place]or covering provider during after hours 7[place] for this patient?  1. Check the care team  in [facility] and look for a) attending/consulting [facility] provider listed and b) the [facility] team listed 2. Log into [email or web address] and use [place]'s universal password to access. If you do not have the password, please contact the hospital operator. 3. Locate the [facility] provider you are looking for under [facility] and page to a number that you can be directly reached. 4. If you still have difficulty reaching the provider, please page the [facility] (Director on Call) for the Hospitalists listed on amion for assistance.  02/17/2019, 11:56 AM

## 2019-02-18 ENCOUNTER — Encounter (HOSPITAL_COMMUNITY): Payer: Self-pay | Admitting: General Practice

## 2019-02-18 ENCOUNTER — Other Ambulatory Visit: Payer: Self-pay

## 2019-02-18 DIAGNOSIS — K529 Noninfective gastroenteritis and colitis, unspecified: Secondary | ICD-10-CM | POA: Diagnosis present

## 2019-02-18 LAB — BASIC METABOLIC PANEL
Anion gap: 11 (ref 5–15)
BUN: <5 mg/dL — ABNORMAL LOW (ref 6–20)
CO2: 23 mmol/L (ref 22–32)
Calcium: 8.8 mg/dL — ABNORMAL LOW (ref 8.9–10.3)
Chloride: 104 mmol/L (ref 98–111)
Creatinine, Ser: 0.69 mg/dL (ref 0.44–1.00)
GFR calc Af Amer: >60 mL/min (ref >60)
GFR calc non Af Amer: >60 mL/min (ref >60)
Glucose, Bld: 109 mg/dL — ABNORMAL HIGH (ref 70–99)
Potassium: 2.9 mmol/L — ABNORMAL LOW (ref 3.5–5.1)
Sodium: 138 mmol/L (ref 135–145)

## 2019-02-18 NOTE — Discharge Summary (Signed)
Physician Discharge Summary  LINDALEE HUIZINGA QMG:[id number] DOB: 1985-07-06 DOA: 02/16/2019  PCP: Patient, No Pcp Per  Admit date: 02/16/2019 Discharge date: 02/18/2019  Time spent: 45 minutes  Recommendations for Outpatient Follow-up:  1. Follow up with PCP 1 week for evaluation of symptoms and follow results GC test   Discharge Diagnoses:  Principal Problem:   Intractable nausea and vomiting Active Problems:   History of chlamydia   Urinary tract infection without hematuria   Proctocolitis   Seizure disorder [facility])   Discharge Condition: stable. Tolerating regular diet without problem  Diet recommendation: regulsr  There were no vitals filed for this visit.  History of present illness:  Janet Dixon  is a 33 y.o. female, with past medical history significant for polysubstance abuse, asthma, STDs who presented 11/7 for intractable nausea and vomiting.  Patient complained of abdominal pain and asked to be admitted to have this under control.  Otherwise the patient did not provide any other history.. In the emergency room work-up showed slightly elevated lipase of abdomen pelvis showed mild proctocolitis.  This was positive and she is being treated for UTI with Rocephin.   Hospital Course:  Intractable nausea and vomiting. Hx of same. CT reveals mild proctocolitis. No acute abnormality otherwise. Lipase slightly elevated. Provided with reglan and pain meds and bowel rest. This am requesting food. Denies abdominal pain/nausea. Provided with regular diet which she tolerated well. Will discharge. Recommend PCP follow up 1 week for evaluation of symptoms  UTI. Questionable at best. No symptoms. Provided with rocephin for 2 days. OP follow up.  Pancreatitis, probably chronic.  Polysubstance abuse. No signs of withdrawal.   Mild proctocolitis. stable  Procedures:    Consultations:    Discharge Exam: Vitals:   02/17/19 1515 02/17/19 2050  BP: (!) 143/96 (!) 138/95   Pulse: 79 78  Resp: 18 18  Temp: 98.9 F (37.2 C) 100 F (37.8 C)  SpO2: 100% 98%    General: awake alert no acute distress Cardiovascular: rrr no mgr no LE edema Respiratory: normal effort BS clear bilaterally no wheeze Abdomen: non-distended non-tender +BS no guarding or rebounding Discharge Instructions   Discharge Instructions    Call MD for:  temperature >100.4   Complete by: As directed    Diet - low sodium heart healthy   Complete by: As directed    Discharge instructions   Complete by: As directed    Follow up with PCP 1 week for evaluation of symptoms and to follow GC test   Increase activity slowly   Complete by: As directed      Allergies as of 02/18/2019      Reactions   Macrobid [nitrofurantoin Macrocrystal] Shortness Of Breath, Nausea And Vomiting, Other (See Comments)   dizziness   Diphenhydramine Hcl Other (See Comments)   Unknown-patient states that she does take this medication   Ibuprofen Other (See Comments)   Patient states that syncope has occurred after taking this medication   Flexeril [cyclobenzaprine Hcl] Rash      Medication List    STOP taking these medications   PNV Prenatal Plus Multivitamin 27-1 MG Tabs     TAKE these medications   albuterol 108 (90 Base) MCG/ACT inhaler Commonly known as: VENTOLIN HFA Inhale 2 puffs into the lungs every 6 (six) hours as needed for wheezing or shortness of breath.   dicyclomine 20 MG tablet Commonly known as: BENTYL Take 1 tablet (20 mg total) by mouth 2 (two) times daily.  ondansetron 4 MG disintegrating tablet Commonly known as: Zofran ODT Take 1 tablet (4 mg total) by mouth every 8 (eight) hours as needed for nausea or vomiting.      Allergies  Allergen Reactions  . Macrobid [Nitrofurantoin Macrocrystal] Shortness Of Breath, Nausea And Vomiting and Other (See Comments)    dizziness  . Diphenhydramine Hcl Other (See Comments)    Unknown-patient states that she does take this medication   . Ibuprofen Other (See Comments)    Patient states that syncope has occurred after taking this medication  . Flexeril [Cyclobenzaprine Hcl] Rash      The results of significant diagnostics from this hospitalization (including imaging, microbiology, ancillary and laboratory) are listed below for reference.    Significant Diagnostic Studies: Ct Abdomen Pelvis W Contrast  Result Date: 02/16/2019 CLINICAL DATA:  Acute abdominal pain and vomiting. EXAM: CT ABDOMEN AND PELVIS WITH CONTRAST TECHNIQUE: Multidetector CT imaging of the abdomen and pelvis was performed using the standard protocol following bolus administration of intravenous contrast. CONTRAST:  134m OMNIPAQUE IOHEXOL 300 MG/ML  SOLN COMPARISON:  CT abdomen pelvis dated January 09, 2019. FINDINGS: Lower chest: No acute abnormality. Hepatobiliary: No focal liver abnormality is seen. No gallstones, gallbladder wall thickening, or biliary dilatation. Pancreas: Unremarkable. No pancreatic ductal dilatation or surrounding inflammatory changes. Spleen: Normal in size without focal abnormality. Adrenals/Urinary Tract: Adrenal glands are unremarkable. Unchanged mild right renal scarring. The kidneys are otherwise normal, without renal calculi, focal lesion, or hydronephrosis. Bladder is unremarkable. Stomach/Bowel: Stomach is within normal limits. Appendix is not discretely visualized, but there are no signs of inflammation at the base of the cecum. Mild wall thickening of the distal sigmoid colon and rectum. No obstruction. Vascular/Lymphatic: No significant vascular findings are present. No enlarged abdominal or pelvic lymph nodes. Reproductive: Uterus and bilateral adnexa are unremarkable. Other: Trace free fluid in the pelvis, likely physiologic. No pneumoperitoneum. Musculoskeletal: No acute or significant osseous findings. IMPRESSION: 1. Mild proctocolitis. Electronically Signed   By: WTitus DubinM.D.   On: 02/16/2019 12:56     Microbiology: Recent Results (from the past 240 hour(s))  SARS CORONAVIRUS 2 (TAT 6-24 HRS) Nasopharyngeal Nasopharyngeal Swab     Status: None   Collection Time: 02/16/19  6:38 PM   Specimen: Nasopharyngeal Swab  Result Value Ref Range Status   SARS Coronavirus 2 NEGATIVE NEGATIVE Final    Comment: (NOTE) SARS-CoV-2 target nucleic acids are NOT DETECTED. The SARS-CoV-2 RNA is generally detectable in upper and lower respiratory specimens during the acute phase of infection. Negative results do not preclude SARS-CoV-2 infection, do not rule out co-infections with other pathogens, and should not be used as the sole basis for treatment or other patient management decisions. Negative results must be combined with clinical observations, patient history, and epidemiological information. The expected result is Negative. Fact Sheet for Patients: hSugarRoll.beFact Sheet for Healthcare Providers: hhttps://www.woods-mathews.com/This test is not yet approved or cleared by the [place] and  has been authorized for detection and/or diagnosis of SARS-CoV-2 by FDA under an Emergency Use Authorization (EUA). This EUA will remain  in effect (meaning this test can be used) for the duration of the COVID-19 declaration under Section 56 4(b)(1) of the Act, 21 U.S.C. section 360bbb-3(b)(1), unless the authorization is terminated or revoked sooner. Performed at [facility] [address] [place] [address]     Labs: Basic Metabolic Panel: Recent Labs  Lab 02/16/19 1032 02/17/19 0313 02/18/19 1118  NA  142 141 138  K 3.5 3.2* 2.9*  CL 112* 110 104  CO2 19* 21* 23  GLUCOSE 122* 114* 109*  BUN 7 5* <5*  CREATININE 0.67 0.73 0.69  CALCIUM 9.5 8.9 8.8*  MG  --  1.9  --    Liver Function Tests: Recent Labs  Lab 02/16/19 1032  AST 20  ALT 17  ALKPHOS 58  BILITOT 0.3  PROT 7.2  ALBUMIN 4.1   Recent Labs  Lab  02/16/19 1032  LIPASE 87*   No results for input(s): AMMONIA in the last 168 hours. CBC: Recent Labs  Lab 02/16/19 1032  WBC 6.6  NEUTROABS 4.9  HGB 13.1  HCT 38.9  MCV 96.5  PLT 368   Cardiac Enzymes: No results for input(s): CKTOTAL, CKMB, CKMBINDEX, TROPONINI in the last 168 hours. BNP: BNP (last 3 results) No results for input(s): BNP in the last 8760 hours.  ProBNP (last 3 results) No results for input(s): PROBNP in the last 8760 hours.  CBG: No results for input(s): GLUCAP in the last 168 hours.     SignedRadene Gunning NP [facility] 02/18/2019, 2:10 PM

## 2019-02-19 LAB — GC/CHLAMYDIA PROBE AMP (~~LOC~~) NOT AT ARMC
Chlamydia: NEGATIVE
Comment: NEGATIVE
Comment: NORMAL
Neisseria Gonorrhea: NEGATIVE

## 2019-03-08 ENCOUNTER — Encounter (HOSPITAL_COMMUNITY): Payer: Self-pay | Admitting: Emergency Medicine

## 2019-03-08 ENCOUNTER — Emergency Department (HOSPITAL_COMMUNITY)
Admission: EM | Admit: 2019-03-08 | Discharge: 2019-03-08 | Disposition: A | Discharge status: Home / Self Care | Payer: Medicaid Other | Attending: Emergency Medicine | Admitting: Emergency Medicine

## 2019-03-08 ENCOUNTER — Other Ambulatory Visit: Payer: Self-pay

## 2019-03-08 DIAGNOSIS — R1010 Upper abdominal pain, unspecified: Secondary | ICD-10-CM | POA: Insufficient documentation

## 2019-03-08 DIAGNOSIS — R109 Unspecified abdominal pain: Secondary | ICD-10-CM | POA: Diagnosis present

## 2019-03-08 DIAGNOSIS — R112 Nausea with vomiting, unspecified: Secondary | ICD-10-CM | POA: Insufficient documentation

## 2019-03-08 DIAGNOSIS — F17210 Nicotine dependence, cigarettes, uncomplicated: Secondary | ICD-10-CM | POA: Insufficient documentation

## 2019-03-08 LAB — CBC WITH DIFFERENTIAL/PLATELET
Abs Immature Granulocytes: 0.03 10*3/uL (ref 0.00–0.07)
Basophils Absolute: 0 10*3/uL (ref 0.0–0.1)
Basophils Relative: 0 %
Eosinophils Absolute: 0 10*3/uL (ref 0.0–0.5)
Eosinophils Relative: 0 %
HCT: 41.2 % (ref 36.0–46.0)
Hemoglobin: 13.9 g/dL (ref 12.0–15.0)
Immature Granulocytes: 0 %
Lymphocytes Relative: 26 %
Lymphs Abs: 2.7 10*3/uL (ref 0.7–4.0)
MCH: 32.3 pg (ref 26.0–34.0)
MCHC: 33.7 g/dL (ref 30.0–36.0)
MCV: 95.8 fL (ref 80.0–100.0)
Monocytes Absolute: 0.6 10*3/uL (ref 0.1–1.0)
Monocytes Relative: 6 %
Neutro Abs: 6.7 10*3/uL (ref 1.7–7.7)
Neutrophils Relative %: 68 %
Platelets: 393 10*3/uL (ref 150–400)
RBC: 4.3 MIL/uL (ref 3.87–5.11)
RDW: 13.3 % (ref 11.5–15.5)
WBC: 10.1 10*3/uL (ref 4.0–10.5)
nRBC: 0 % (ref 0.0–0.2)

## 2019-03-08 LAB — COMPREHENSIVE METABOLIC PANEL
ALT: 24 U/L (ref 0–44)
AST: 20 U/L (ref 15–41)
Albumin: 5 g/dL (ref 3.5–5.0)
Alkaline Phosphatase: 64 U/L (ref 38–126)
Anion gap: 13 (ref 5–15)
BUN: 16 mg/dL (ref 6–20)
CO2: 22 mmol/L (ref 22–32)
Calcium: 9.8 mg/dL (ref 8.9–10.3)
Chloride: 108 mmol/L (ref 98–111)
Creatinine, Ser: 0.68 mg/dL (ref 0.44–1.00)
GFR calc Af Amer: >60 mL/min (ref >60)
GFR calc non Af Amer: >60 mL/min (ref >60)
Glucose, Bld: 109 mg/dL — ABNORMAL HIGH (ref 70–99)
Potassium: 3.3 mmol/L — ABNORMAL LOW (ref 3.5–5.1)
Sodium: 143 mmol/L (ref 135–145)
Total Bilirubin: 0.7 mg/dL (ref 0.3–1.2)
Total Protein: 8.5 g/dL — ABNORMAL HIGH (ref 6.5–8.1)

## 2019-03-08 LAB — LIPASE, BLOOD: Lipase: 24 U/L (ref 11–51)

## 2019-03-08 MED ORDER — METOCLOPRAMIDE HCL 10 MG PO TABS: 10.0000 mg | ORAL_TABLET | Freq: Four times a day (QID) | ORAL | 0 refills | Status: DC | PRN

## 2019-03-08 NOTE — ED Triage Notes (Addendum)
Pt recently discharge from hospital with same complaints. During triage pt not verbally responding to triage questions, just shaking her head yes and no.

## 2019-03-08 NOTE — ED Notes (Signed)
Patient refused discharge vital signs. Patient stated he ride was here. Patient's IV removed. Patient given discharge papers and prescriptions.

## 2019-03-08 NOTE — ED Provider Notes (Signed)
[facility] EMERGENCY DEPARTMENT Provider Note   CSN: [id number] Arrival date & time: 03/08/19  0417     History   Chief Complaint Chief Complaint  Patient presents with  . Pancreatitis    HPI Janet Dixon is a 33 y.o. female.     Patient with asthma, recurrent vomiting, pancreatitis history presents with central abdominal pain and nausea.  Patient does not have the vomiting like she did last admission.  Recent admission and discharge for abdominal pain vomiting.  Patient denies fevers or chills.  No respiratory symptoms.  Patient does admit to taking boyfriends narcotic pills.  Patient says she is not addicted she just does it because he has some.     Past Medical History:  Diagnosis Date  . Asthma   . History of chlamydia   . History of gonorrhea   . History of trichomoniasis   . Seizures [facility])     Patient Active Problem List   Diagnosis Date Noted  . 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 Intractable nausea and vomiting 02/16/2019  . Abdominal pain   . Urinary tract infection without hematuria   . Intractable vomiting   . Pyelonephritis 08/04/2018  . Status post repeat low transverse cesarean section 12/09/2017  . Drug use affecting pregnancy 06/08/2017  . Elevated lipase 04/14/2017  . Stomach pain 04/14/2017  . History of C-section 04/14/2017  . Hematuria 12/07/2016  . History of chlamydia 12/07/2016  . Papanicolaou smear of cervix with positive high risk human papilloma virus (HPV) test 09/07/2016  . Trichimoniasis 08/29/2016  . Seizure disorder ([place]) 05/01/2014  . Cocaine abuse ([place]) 03/14/2014    Past Surgical History:  Procedure Laterality Date  . CESAREAN SECTION    . CESAREAN SECTION N/A 12/02/2014   Procedure: REPEAT CESAREAN SECTION;  Surgeon: Jonnie Kind, MD;  Location: [facility];  Service: Obstetrics;  Laterality: N/A;  . CESAREAN SECTION N/A 12/09/2017   Procedure: CESAREAN SECTION;  Surgeon: Jonnie Kind, MD;  Location: [place];   Service: Obstetrics;  Laterality: N/A;     OB History    Gravida  8   Para  7   Term  7   Preterm      AB  0   Living  7     SAB  0   TAB      Ectopic      Multiple  0   Live Births  7            Home Medications    Prior to Admission medications   Medication Sig Start Date End Date Taking? Authorizing Provider  albuterol (PROVENTIL HFA;VENTOLIN HFA) 108 (90 Base) MCG/ACT inhaler Inhale 2 puffs into the lungs every 6 (six) hours as needed for wheezing or shortness of breath. Patient not taking: Reported on 11/08/2018 08/29/16   Estill Dooms, NP  dicyclomine (BENTYL) 20 MG tablet Take 1 tablet (20 mg total) by mouth 2 (two) times daily. Patient not taking: Reported on 02/16/2019 01/09/19   Gareth Morgan, MD  metoCLOPramide (REGLAN) 10 MG tablet Take 1 tablet (10 mg total) by mouth every 6 (six) hours as needed for nausea (nausea/headache). 03/08/19   Elnora Morrison, MD  ondansetron (ZOFRAN ODT) 4 MG disintegrating tablet Take 1 tablet (4 mg total) by mouth every 8 (eight) hours as needed for nausea or vomiting. Patient not taking: Reported on 02/16/2019 01/09/19   Gareth Morgan, MD  potassium chloride (K-DUR) 10 MEQ tablet Take 1 tablet (10 mEq total)  by mouth 2 (two) times daily for 3 days. Patient not taking: Reported on 11/08/2018 08/05/18 01/09/19  Ellsworth Bing, DO    Family History Family History  Problem Relation Age of Onset  . Hypertension Mother   . Asthma Brother   . Asthma Daughter   . Eczema Daughter   . Asthma Son   . Eczema Son   . Aneurysm Maternal Grandmother   . Heart disease Maternal Grandmother   . Cancer Maternal Grandmother   . Asthma Daughter   . Eczema Daughter   . Asthma Son   . Eczema Son   . Eczema Son   . Cancer Maternal Grandfather        colon  . Eczema Maternal Aunt     Social History Social History   Tobacco Use  . Smoking status: Current Every Day Smoker    Packs/day: 0.50    Years: 13.00    Pack  years: 6.50    Types: Cigarettes  . Smokeless tobacco: Never Used  . Tobacco comment: 3 per day  Substance Use Topics  . Alcohol use: No    Comment: none x 1 month ago  . Drug use: Yes    Types: Marijuana, Cocaine    Comment: states its been almost a year     Allergies   Macrobid [nitrofurantoin macrocrystal], Diphenhydramine hcl, Ibuprofen, and Flexeril [cyclobenzaprine hcl]   Review of Systems Review of Systems  Constitutional: Negative for chills and fever.  HENT: Negative for congestion.   Eyes: Negative for visual disturbance.  Respiratory: Negative for shortness of breath.   Cardiovascular: Negative for chest pain.  Gastrointestinal: Positive for abdominal pain, nausea and vomiting.  Genitourinary: Negative for dysuria and flank pain.  Musculoskeletal: Negative for back pain, neck pain and neck stiffness.  Skin: Negative for rash.  Neurological: Negative for light-headedness and headaches.     Physical Exam Updated Vital Signs BP (!) 148/101 (BP Location: Right Arm)   Pulse 71   Temp 99.1 F (37.3 C) (Oral)   Resp 16   Ht 5' 1"  (1.549 m)   Wt 54.4 kg   SpO2 98%   BMI 22.67 kg/m   Physical Exam Vitals signs and nursing note reviewed.  Constitutional:      Appearance: She is well-developed.  HENT:     Head: Normocephalic and atraumatic.  Eyes:     General:        Right eye: No discharge.        Left eye: No discharge.     Conjunctiva/sclera: Conjunctivae normal.  Neck:     Musculoskeletal: Normal range of motion and neck supple.     Trachea: No tracheal deviation.  Cardiovascular:     Rate and Rhythm: Normal rate and regular rhythm.  Pulmonary:     Effort: Pulmonary effort is normal.     Breath sounds: Normal breath sounds.  Abdominal:     General: There is no distension.     Palpations: Abdomen is soft.     Tenderness: There is abdominal tenderness (mild upper abd). There is no guarding.  Skin:    General: Skin is warm.     Findings: No rash.   Neurological:     Mental Status: She is alert and oriented to person, place, and time.      ED Treatments / Results  Labs (all labs ordered are listed, but only abnormal results are displayed) Labs Reviewed  COMPREHENSIVE METABOLIC PANEL - Abnormal; Notable for the following components:  Result Value   Potassium 3.3 (*)    Glucose, Bld 109 (*)    Total Protein 8.5 (*)    All other components within normal limits  CBC WITH DIFFERENTIAL/PLATELET  LIPASE, BLOOD  PREGNANCY, URINE  URINALYSIS, ROUTINE W REFLEX MICROSCOPIC    EKG None  Radiology No results found.  Procedures Procedures (including critical care time)  Medications Ordered in ED Medications - No data to display   Initial Impression / Assessment and Plan / ED Course  I have reviewed the triage vital signs and the nursing notes.  Pertinent labs & imaging results that were available during my care of the patient were reviewed by me and considered in my medical decision making (see chart for details).       Patient presents with recurrent abdominal discomfort and nausea.  We had a long discussion regarding importance of not taking other peoples medicines and narcotics from her boyfriend.  Patient does hope to improve these decisions as she has a daughter to take care of.  Discussed supportive care for this acute on chronic abdominal pain and outpatient follow-up.  Blood work overall reassuring.  Vital signs normal except for mild elevated blood pressure for which she can follow-up outpatient.  Nausea meds for home.  Potassium mild low 3.3.  No active vomiting in the room. Reviewed recent medical records and discharge summary. Final Clinical Impressions(s) / ED Diagnoses   Final diagnoses:  Upper abdominal pain, unspecified  Non-intractable vomiting with nausea, unspecified vomiting type    ED Discharge Orders         Ordered    metoCLOPramide (REGLAN) 10 MG tablet  Every 6 hours PRN     03/08/19 0518            Elnora Morrison, MD 03/08/19 [phone]

## 2019-03-08 NOTE — ED Triage Notes (Signed)
Pt comes in c/o bad pain related to chronic pancreatitis. Pain x2 days.

## 2019-03-08 NOTE — Discharge Instructions (Signed)
Follow-up with your primary doctor.  Be sure not to take other peoples pills as we discussed. Use Tylenol and Motrin as needed for pain. Use Reglan as needed for nausea and vomiting. Return for worsening or new symptoms.  Gradually increase your diet from liquids to soft.

## 2019-08-01 ENCOUNTER — Other Ambulatory Visit: Payer: Medicaid Other

## 2019-08-15 ENCOUNTER — Ambulatory Visit: Payer: Medicaid Other | Admitting: Adult Health

## 2019-08-21 ENCOUNTER — Encounter: Payer: Self-pay | Admitting: Adult Health

## 2019-08-21 ENCOUNTER — Ambulatory Visit (INDEPENDENT_AMBULATORY_CARE_PROVIDER_SITE_OTHER): Payer: Medicaid Other | Admitting: Adult Health

## 2019-08-21 VITALS — BP 117/77 | HR 123 | Ht 60.0 in | Wt 128.0 lb

## 2019-08-21 DIAGNOSIS — Z3202 Encounter for pregnancy test, result negative: Secondary | ICD-10-CM | POA: Diagnosis not present

## 2019-08-21 DIAGNOSIS — A599 Trichomoniasis, unspecified: Secondary | ICD-10-CM | POA: Diagnosis not present

## 2019-08-21 DIAGNOSIS — N898 Other specified noninflammatory disorders of vagina: Secondary | ICD-10-CM | POA: Diagnosis not present

## 2019-08-21 DIAGNOSIS — Z113 Encounter for screening for infections with a predominantly sexual mode of transmission: Secondary | ICD-10-CM | POA: Insufficient documentation

## 2019-08-21 LAB — POCT WET PREP (WET MOUNT)
Clue Cells Wet Prep Whiff POC: POSITIVE
WBC, Wet Prep HPF POC: POSITIVE

## 2019-08-21 LAB — POCT URINE PREGNANCY: Preg Test, Ur: NEGATIVE

## 2019-08-21 MED ORDER — METRONIDAZOLE 500 MG PO TABS: 500.0000 mg | ORAL_TABLET | Freq: Two times a day (BID) | ORAL | 0 refills | Status: DC

## 2019-08-21 NOTE — Progress Notes (Signed)
  Subjective:     Patient ID: Janet Dixon, female   DOB: Jun 04, 1985, 34 y.o.   MRN: [id number]  HPI Janet Dixon is a 34 year old black female, single,[id number] in requesting STD testing, has vaginal discharge.  Last pap was 08/29/16 and was +HPV and +chlamydia and yeast and negative for malignancy.    Review of Systems Has noticed vaginal discharge with some itching,wants STD testing Reviewed past medical,surgical, social and family history. Reviewed medications and allergies.     Objective:   Physical Exam BP 117/77 (BP Location: Left Arm, Patient Position: Sitting, Cuff Size: Normal)   Pulse (!) 123   Ht 5' (1.524 m)   Wt 128 lb (58.1 kg)   LMP 07/17/2019 (Approximate)   Breastfeeding No   BMI 25.00 kg/m  UPT is negative.  Fall risk is low. Skin warm and dry.Pelvic: external genitalia is normal in appearance no lesions, vagina: creamy,frothy discharge with odor,urethra has no lesions or masses noted, cervix:smooth and bulbous, uterus: normal size, shape and contour, non tender, no masses felt, adnexa: no masses or tenderness noted. Bladder is non tender and no masses felt. Wet prep: + for trich and +WBCs. Nuswab obtained Examination chaperoned by Dwyane Dee LPN    Assessment:     1. Pregnancy examination or test, negative result She declines birth control but encouraged to use condoms   2. Screening examination for STD (sexually transmitted disease) Nuswab sent Check HIV and RPR  3. Vaginal discharge Nuswab sent   4. Trichomoniasis I let her see wet prep slide too. Will rx flagyl Meds ordered this encounter  Medications  . metroNIDAZOLE (FLAGYL) 500 MG tablet    Sig: Take 1 tablet (500 mg total) by mouth 2 (two) times daily.    Dispense:  14 tablet    Refill:  0    Order Specific Question:   Supervising Provider    Answer:   Florian Buff [[id number]]     And treated partner Janet Dixon, dob [birth year] gave her rx for flagyl 500 mg #4 4 po now for him Plan:      No sex  til after next visit Return in 2 weeks for pap and physical

## 2019-08-24 LAB — NUSWAB VAGINITIS PLUS (VG+)
Atopobium vaginae: HIGH Score — AB
BVAB 2: HIGH Score — AB
Candida albicans, NAA: NEGATIVE
Candida glabrata, NAA: NEGATIVE
Chlamydia trachomatis, NAA: NEGATIVE
Megasphaera 1: HIGH Score — AB
Neisseria gonorrhoeae, NAA: NEGATIVE
Trich vag by NAA: POSITIVE — AB

## 2019-09-05 ENCOUNTER — Telehealth: Payer: Self-pay | Admitting: Adult Health

## 2019-09-05 NOTE — Telephone Encounter (Signed)
Pt would like someone to go over her test results from 08/21/19 labs

## 2019-09-06 NOTE — Telephone Encounter (Signed)
Telephoned patient at home number and left message.

## 2019-09-11 ENCOUNTER — Telehealth: Payer: Self-pay | Admitting: Adult Health

## 2019-09-11 NOTE — Telephone Encounter (Signed)
Calling to get any further results from her STD checks. Said she already knew about the Dalbert Batman but wanted to see if anything else had come back

## 2019-09-11 NOTE — Telephone Encounter (Signed)
Attempted to call patient back. No answer and no option to leave a vm.

## 2019-09-26 ENCOUNTER — Ambulatory Visit: Payer: Medicaid Other | Admitting: Adult Health

## 2019-10-07 ENCOUNTER — Ambulatory Visit: Payer: Medicaid Other | Admitting: Adult Health

## 2019-12-19 DIAGNOSIS — R52 Pain, unspecified: Secondary | ICD-10-CM | POA: Diagnosis not present

## 2019-12-19 DIAGNOSIS — R1084 Generalized abdominal pain: Secondary | ICD-10-CM | POA: Diagnosis not present

## 2019-12-19 DIAGNOSIS — R5381 Other malaise: Secondary | ICD-10-CM | POA: Diagnosis not present

## 2019-12-19 DIAGNOSIS — R1111 Vomiting without nausea: Secondary | ICD-10-CM | POA: Diagnosis not present

## 2019-12-19 DIAGNOSIS — R531 Weakness: Secondary | ICD-10-CM | POA: Diagnosis not present

## 2019-12-19 DIAGNOSIS — R112 Nausea with vomiting, unspecified: Secondary | ICD-10-CM | POA: Diagnosis not present

## 2019-12-20 ENCOUNTER — Encounter (HOSPITAL_COMMUNITY): Payer: Self-pay | Admitting: Emergency Medicine

## 2019-12-20 ENCOUNTER — Emergency Department (HOSPITAL_COMMUNITY): Payer: Medicaid Other

## 2019-12-20 ENCOUNTER — Emergency Department (HOSPITAL_COMMUNITY)
Admission: EM | Admit: 2019-12-20 | Discharge: 2019-12-20 | Disposition: A | Discharge status: Home / Self Care | Payer: Medicaid Other | Attending: Emergency Medicine | Admitting: Emergency Medicine

## 2019-12-20 ENCOUNTER — Other Ambulatory Visit: Payer: Self-pay

## 2019-12-20 DIAGNOSIS — F1910 Other psychoactive substance abuse, uncomplicated: Secondary | ICD-10-CM | POA: Diagnosis not present

## 2019-12-20 DIAGNOSIS — Z20822 Contact with and (suspected) exposure to covid-19: Secondary | ICD-10-CM | POA: Diagnosis not present

## 2019-12-20 DIAGNOSIS — R0689 Other abnormalities of breathing: Secondary | ICD-10-CM | POA: Diagnosis not present

## 2019-12-20 DIAGNOSIS — G40909 Epilepsy, unspecified, not intractable, without status epilepticus: Secondary | ICD-10-CM | POA: Insufficient documentation

## 2019-12-20 DIAGNOSIS — R404 Transient alteration of awareness: Secondary | ICD-10-CM | POA: Diagnosis not present

## 2019-12-20 DIAGNOSIS — R569 Unspecified convulsions: Secondary | ICD-10-CM

## 2019-12-20 DIAGNOSIS — F17210 Nicotine dependence, cigarettes, uncomplicated: Secondary | ICD-10-CM | POA: Insufficient documentation

## 2019-12-20 DIAGNOSIS — J320 Chronic maxillary sinusitis: Secondary | ICD-10-CM | POA: Diagnosis not present

## 2019-12-20 DIAGNOSIS — R531 Weakness: Secondary | ICD-10-CM | POA: Insufficient documentation

## 2019-12-20 DIAGNOSIS — R112 Nausea with vomiting, unspecified: Secondary | ICD-10-CM | POA: Diagnosis not present

## 2019-12-20 DIAGNOSIS — R4020 Unspecified coma: Secondary | ICD-10-CM | POA: Diagnosis not present

## 2019-12-20 DIAGNOSIS — R4182 Altered mental status, unspecified: Secondary | ICD-10-CM | POA: Diagnosis present

## 2019-12-20 DIAGNOSIS — J45909 Unspecified asthma, uncomplicated: Secondary | ICD-10-CM | POA: Diagnosis not present

## 2019-12-20 DIAGNOSIS — I639 Cerebral infarction, unspecified: Secondary | ICD-10-CM | POA: Diagnosis not present

## 2019-12-20 DIAGNOSIS — R29810 Facial weakness: Secondary | ICD-10-CM | POA: Diagnosis not present

## 2019-12-20 DIAGNOSIS — R29818 Other symptoms and signs involving the nervous system: Secondary | ICD-10-CM | POA: Diagnosis not present

## 2019-12-20 LAB — COMPREHENSIVE METABOLIC PANEL
ALT: 21 U/L (ref 0–44)
AST: 26 U/L (ref 15–41)
Albumin: 4.6 g/dL (ref 3.5–5.0)
Alkaline Phosphatase: 61 U/L (ref 38–126)
Anion gap: 11 (ref 5–15)
BUN: 14 mg/dL (ref 6–20)
CO2: 25 mmol/L (ref 22–32)
Calcium: 9.5 mg/dL (ref 8.9–10.3)
Chloride: 104 mmol/L (ref 98–111)
Creatinine, Ser: 0.69 mg/dL (ref 0.44–1.00)
GFR calc Af Amer: >60 mL/min (ref >60)
GFR calc non Af Amer: >60 mL/min (ref >60)
Glucose, Bld: 108 mg/dL — ABNORMAL HIGH (ref 70–99)
Potassium: 3.1 mmol/L — ABNORMAL LOW (ref 3.5–5.1)
Sodium: 140 mmol/L (ref 135–145)
Total Bilirubin: 0.8 mg/dL (ref 0.3–1.2)
Total Protein: 8 g/dL (ref 6.5–8.1)

## 2019-12-20 LAB — CBC
HCT: 38.5 % (ref 36.0–46.0)
Hemoglobin: 13.3 g/dL (ref 12.0–15.0)
MCH: 32.7 pg (ref 26.0–34.0)
MCHC: 34.5 g/dL (ref 30.0–36.0)
MCV: 94.6 fL (ref 80.0–100.0)
Platelets: 313 10*3/uL (ref 150–400)
RBC: 4.07 MIL/uL (ref 3.87–5.11)
RDW: 12.9 % (ref 11.5–15.5)
WBC: 12.6 10*3/uL — ABNORMAL HIGH (ref 4.0–10.5)
nRBC: 0 % (ref 0.0–0.2)

## 2019-12-20 LAB — APTT: aPTT: 30 seconds (ref 24–36)

## 2019-12-20 LAB — DIFFERENTIAL
Abs Immature Granulocytes: 0.04 10*3/uL (ref 0.00–0.07)
Basophils Absolute: 0 10*3/uL (ref 0.0–0.1)
Basophils Relative: 0 %
Eosinophils Absolute: 0 10*3/uL (ref 0.0–0.5)
Eosinophils Relative: 0 %
Immature Granulocytes: 0 %
Lymphocytes Relative: 21 %
Lymphs Abs: 2.6 10*3/uL (ref 0.7–4.0)
Monocytes Absolute: 1.2 10*3/uL — ABNORMAL HIGH (ref 0.1–1.0)
Monocytes Relative: 10 %
Neutro Abs: 8.7 10*3/uL — ABNORMAL HIGH (ref 1.7–7.7)
Neutrophils Relative %: 69 %

## 2019-12-20 LAB — PROTIME-INR
INR: 1.2 (ref 0.8–1.2)
Prothrombin Time: 14.8 seconds (ref 11.4–15.2)

## 2019-12-20 LAB — ETHANOL: Alcohol, Ethyl (B): <10 mg/dL (ref <10)

## 2019-12-20 LAB — SALICYLATE LEVEL: Salicylate Lvl: <7 mg/dL — ABNORMAL LOW (ref 7.0–30.0)

## 2019-12-20 LAB — ACETAMINOPHEN LEVEL: Acetaminophen (Tylenol), Serum: <10 ug/mL — ABNORMAL LOW (ref 10–30)

## 2019-12-20 LAB — CBG MONITORING, ED: Glucose-Capillary: 116 mg/dL — ABNORMAL HIGH (ref 70–99)

## 2019-12-20 MED ORDER — ALUM & MAG HYDROXIDE-SIMETH 200-200-20 MG/5ML PO SUSP: 30.0000 mL | Freq: Four times a day (QID) | ORAL | Status: DC | PRN

## 2019-12-20 MED ORDER — NICOTINE 21 MG/24HR TD PT24: 21.0000 mg | MEDICATED_PATCH | Freq: Every day | TRANSDERMAL | Status: DC

## 2019-12-20 MED ORDER — LEVETIRACETAM 500 MG PO TABS: 500.0000 mg | ORAL_TABLET | Freq: Two times a day (BID) | ORAL | 0 refills | Status: DC

## 2019-12-20 MED ORDER — LEVETIRACETAM 500 MG PO TABS: 500.0000 mg | ORAL_TABLET | Freq: Two times a day (BID) | ORAL | Status: DC

## 2019-12-20 MED ORDER — ACETAMINOPHEN 325 MG PO TABS: 650.0000 mg | ORAL_TABLET | ORAL | Status: DC | PRN

## 2019-12-20 MED ORDER — ONDANSETRON HCL 4 MG PO TABS: 4.0000 mg | ORAL_TABLET | Freq: Three times a day (TID) | ORAL | Status: DC | PRN

## 2019-12-20 MED ORDER — LEVETIRACETAM IN NACL 1000 MG/100ML IV SOLN
1000.0000 mg | Freq: Once | INTRAVENOUS | Status: AC
  Administered 2019-12-20: 1000 mg via INTRAVENOUS
  Filled 2019-12-20: qty 100

## 2019-12-20 MED ORDER — FENTANYL CITRATE (PF) 100 MCG/2ML IJ SOLN
50.0000 ug | Freq: Once | INTRAMUSCULAR | Status: AC
  Administered 2019-12-20: 50 ug via INTRAVENOUS
  Filled 2019-12-20: qty 2

## 2019-12-20 NOTE — ED Triage Notes (Signed)
[facility] - pt brought in from home. Pt was found unresponsive with left sided paralysis and AMS. Pt sent to CT for stroke. Pt with history of seizures. Now pt is alert and moving all extremities.

## 2019-12-20 NOTE — B[facility] Counselor (Signed)
Clinician spoke to Rueben Bash, RN per Dr. Rebeca Alert. Melina Copa the pt will be discharged. TTS assessment is not needed at this time.    Vertell Novak, [place], [facility], [facility] Triage Specialist [phone]

## 2019-12-20 NOTE — ED Notes (Signed)
Consult to neuro complete. Patient able to move all extremities. Speech normal.

## 2019-12-20 NOTE — ED Notes (Signed)
This nurse walked in room- pt was not there-pt was seen by staff walking down hall with her blanket. Pt did not notify staff prior to leaving. Dr Melina Copa aware.

## 2019-12-20 NOTE — ED Notes (Signed)
Mother reports CPS took her children from her today  Pt has been stressed  Pt was at [facility] and hospital called CPS and her children were taken this morning

## 2019-12-20 NOTE — Consult Note (Signed)
[facility] Tele[facility]urology Consult Services  Stat Consult  Date of Service:   12/20/2019 19:29:54  Impression:     .  R56.9 - Seizures  Comments/Sign-Out: given her history of seizures, and the clinical features of today, I think is most likely that she has a resolving postictal state. She should be loaded with Keppra in the ER and then started on Keppra 500 twice a day. If she promptly and completely returns to normal neurologically then I think she could be discharged with all patient neurology follow-up, if she has any lingering neurologic issues then she will need at least an observation stay with neurology follow-up tomorrow  CT HEAD:  CLINICAL DATA: Code stroke. Neuro deficit, acute, stroke suspected. Left-sided symptoms. Additional provided: Left-sided paralysis onset 6:40 p.m., history of seizures   EXAM: CT HEAD WITHOUT CONTRAST   TECHNIQUE: Contiguous axial images were obtained from the base of the skull through the vertex without intravenous contrast.   COMPARISON: Noncontrast head CT 03/15/2014.   FINDINGS: Brain:   Cerebral volume is normal for age.   There is no acute intracranial hemorrhage.   No demarcated cortical infarct is identified.   No extra-axial fluid collection.   No evidence of intracranial mass.   No midline shift.   Vascular: No hyperdense vessel.   Skull: Normal. Negative for fracture or focal lesion.   Sinuses/Orbits: Visualized orbits show no acute finding. Partially imaged right maxillary sinus mucous retention cyst. Mild mucosal thickening within the visualized left maxillary sinus. No significant mastoid effusion.   ASPECTS ([place] Stroke Program Early CT Score)   - Ganglionic level infarction (caudate, lentiform nuclei, internal capsule, insula, M1-M3 cortex): 7   - Supraganglionic infarction (M4-M6 cortex): 3   Total score (0-10 with 10 being normal): 10   These results were called by telephone at the time of  interpretation on 12/20/2019 at 7:25 pm to provider [facility] , who verbally acknowledged these results.   IMPRESSION: No CT evidence of acute intracranial abnormality. ASPECTS is 10.   Maxillary paranasal sinus disease as described.     Electronically Signed By: Kellie Simmering DO On: 12/20/2019 19:25   Metrics: [facility] Notification Time: 12/20/2019 19:27:25 Stamp Time: 12/20/2019 19:29:54 Callback Response Time: 12/20/2019 19:32:02  Our recommendations are outlined below.  Recommendations:     .  Start Keppra 500 mg BID     .  see comment section   ----------------------------------------------------------------------------------------------------  Chief Complaint: nausea  History of Present Illness: Patient is a 34 year old Female.  this is a 34 year old female who per the ER triage notes was found unresponsive with left-sided weakness and altered mental status. When she arrived to the ER, according to the ER physician, she had left-sided weakness was quite altered then when she came back from scan she woke up and down she was cold and was currently moving both sides of her body appropriately. When I log on the camera she was bundled up in a blanket and was complaining of being cold and was lethargic and somewhat uncooperative at first but with some coaxing, she announced that she was not having a stroke but she is very cold. She thinks she is in the emergency room because she has had nausea and vomiting recently. She denies any recent drug use. She does not have any numbness or weakness. She does not seem to have insight into why she was brought to the ER but she currently is conversational and neurologically intact. She has had numerous visits for nausea  and vomiting over the last year. she reports a history of seizures. She is not currently on seizure medicine. She reports her last seizure was in January       Examination: BP(151/110), Pulse(75), Blood  Glucose(.) 1A: Level of Consciousness - Alert; keenly responsive + 0 1B: Ask Month and Age - Both Questions Right + 0 1C: Blink Eyes & Squeeze Hands - Performs Both Tasks + 0 2: Test Horizontal Extraocular Movements - Normal + 0 3: Test Visual Fields - No Visual Loss + 0 4: Test Facial Palsy (Use Grimace if Obtunded) - Normal symmetry + 0 5A: Test Left Arm Motor Drift - No Drift for 10 Seconds + 0 5B: Test Right Arm Motor Drift - No Drift for 10 Seconds + 0 6A: Test Left Leg Motor Drift - No Drift for 5 Seconds + 0 6B: Test Right Leg Motor Drift - No Drift for 5 Seconds + 0 7: Test Limb Ataxia (FNF/Heel-Shin) - No Ataxia + 0 8: Test Sensation - Normal; No sensory loss + 0 9: Test Language/Aphasia - Normal; No aphasia + 0 10: Test Dysarthria - Normal + 0 11: Test Extinction/Inattention - No abnormality + 0  NIHSS Score: 0    Patient is being evaluated for possible acute neurologic impairment and high probability of imminent or life-threatening deterioration. I spent total of 20 minutes providing care to this patient, including time for face to face visit via telemedicine, review of medical records, imaging studies and discussion of findings with providers, the patient and/or family.   Dr Janean Sark   [facility] [phone]  Case [id number]

## 2019-12-20 NOTE — ED Notes (Signed)
CODE STROKE PAGED

## 2019-12-20 NOTE — Discharge Instructions (Signed)
You were seen in the emergency department for possible strokelike symptoms.  Your work-up for that was negative and we think you may have had a seizure.  Neurology recommended starting you on Keppra twice a day.  We are giving you a prescription for this.  It will be important that you follow-up with neurology as an outpatient.  Please return to the emergency department if any worsening or concerning symptoms.

## 2019-12-20 NOTE — ED Notes (Signed)
Pt has taken off her leads, her pulse ox , her gown and has purposely done so   She hits the call light as the N exits the room   She does not answer questions, does not voice concerns and then hits the light upon N exiting the room   She refuses to wear gown, BP cuff, Pulse ox

## 2019-12-20 NOTE — ED Provider Notes (Signed)
[facility] EMERGENCY DEPARTMENT Provider Note   CSN: [id number] Arrival date & time: 12/20/19  1910     History No chief complaint on file.   Janet Dixon is a 34 y.o. female. Level 5 caveat secondary to altered mental status and acuity of condition. history via EMS and is limited by acuity of condition.  Last known well 30 minutes prior to arrival.  History of seizures.  EMS found her with left face left arm weakness.  Taken emergently to CT.  No report of seizure activity.  EMS activated by sister who found her face down in bed. The history is provided by the EMS personnel.  Cerebrovascular Accident This is a new problem. The current episode started less than 1 hour ago. The problem has not changed since onset.Nothing aggravates the symptoms. Nothing relieves the symptoms. She has tried nothing for the symptoms. The treatment provided no relief.       Past Medical History:  Diagnosis Date  . Asthma   . History of chlamydia   . History of gonorrhea   . History of trichomoniasis   . Seizures [facility])     Patient Active Problem List   Diagnosis Date Noted  . Vaginal discharge 08/21/2019  . Screening examination for STD (sexually transmitted disease) 08/21/2019  . Pregnancy examination or test, negative result 08/21/2019  . 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 Intractable nausea and vomiting 02/16/2019  . Abdominal pain   . Urinary tract infection without hematuria   . Intractable vomiting   . Pyelonephritis 08/04/2018  . Status post repeat low transverse cesarean section 12/09/2017  . Drug use affecting pregnancy 06/08/2017  . Elevated lipase 04/14/2017  . Stomach pain 04/14/2017  . History of C-section 04/14/2017  . Hematuria 12/07/2016  . History of chlamydia 12/07/2016  . Papanicolaou smear of cervix with positive high risk human papilloma virus (HPV) test 09/07/2016  . Trichimoniasis 08/29/2016  . Seizure disorder ([place]) 05/01/2014  . Cocaine abuse ([place]) 03/14/2014     Past Surgical History:  Procedure Laterality Date  . CESAREAN SECTION    . CESAREAN SECTION N/A 12/02/2014   Procedure: REPEAT CESAREAN SECTION;  Surgeon: Jonnie Kind, MD;  Location: [facility];  Service: Obstetrics;  Laterality: N/A;  . CESAREAN SECTION N/A 12/09/2017   Procedure: CESAREAN SECTION;  Surgeon: Jonnie Kind, MD;  Location: [facility];  Service: Obstetrics;  Laterality: N/A;     OB History    Gravida  8   Para  7   Term  7   Preterm      AB  0   Living  7     SAB  0   TAB      Ectopic      Multiple  0   Live Births  7           Family History  Problem Relation Age of Onset  . Hypertension Mother   . Asthma Brother   . Asthma Daughter   . Eczema Daughter   . Asthma Son   . Eczema Son   . Aneurysm Maternal Grandmother   . Heart disease Maternal Grandmother   . Cancer Maternal Grandmother   . Asthma Daughter   . Eczema Daughter   . Asthma Son   . Eczema Son   . Eczema Son   . Cancer Maternal Grandfather        colon  . Eczema Maternal Aunt     Social History   Tobacco Use  .  Smoking status: Current Every Day Smoker    Packs/day: 0.50    Years: 13.00    Pack years: 6.50    Types: Cigarettes  . Smokeless tobacco: Never Used  . Tobacco comment: 1-2 per day  Vaping Use  . Vaping Use: Never used  Substance Use Topics  . Alcohol use: No    Comment: none x 1 month ago  . Drug use: Yes    Types: Marijuana, Codeine    Comment: once or twice a day    Home Medications Prior to Admission medications   Medication Sig Start Date End Date Taking? Authorizing Provider  albuterol (PROVENTIL HFA;VENTOLIN HFA) 108 (90 Base) MCG/ACT inhaler Inhale 2 puffs into the lungs every 6 (six) hours as needed for wheezing or shortness of breath. 08/29/16   Estill Dooms, NP  metroNIDAZOLE (FLAGYL) 500 MG tablet Take 1 tablet (500 mg total) by mouth 2 (two) times daily. 08/21/19   Estill Dooms, NP  potassium chloride (K-DUR)  10 MEQ tablet Take 1 tablet (10 mEq total) by mouth 2 (two) times daily for 3 days. Patient not taking: Reported on 11/08/2018 08/05/18 01/09/19  Harriet Butte, DO    Allergies    Macrobid [nitrofurantoin macrocrystal], Diphenhydramine hcl, Ibuprofen, and Flexeril [cyclobenzaprine hcl]  Review of Systems   Review of Systems  Unable to perform ROS: Mental status change    Physical Exam Updated Vital Signs BP (!) 153/96 (BP Location: Left Arm)   Pulse 75   Temp 99.8 F (37.7 C) (Oral)   Resp (!) 23   Ht 5' (1.524 m)   Wt 58.1 kg   SpO2 100%   BMI 25.02 kg/m   Physical Exam Vitals and nursing note reviewed.  Constitutional:      General: She is not in acute distress.    Appearance: Normal appearance. She is well-developed.  HENT:     Head: Normocephalic and atraumatic.  Eyes:     Conjunctiva/sclera: Conjunctivae normal.  Cardiovascular:     Rate and Rhythm: Normal rate and regular rhythm.     Heart sounds: No murmur heard.   Pulmonary:     Effort: Pulmonary effort is normal. No respiratory distress.     Breath sounds: Normal breath sounds.  Abdominal:     Palpations: Abdomen is soft.     Tenderness: There is no abdominal tenderness. There is no guarding or rebound.  Musculoskeletal:        General: No deformity or signs of injury.     Cervical back: Normal range of motion and neck supple.  Skin:    General: Skin is warm and dry.     Capillary Refill: Capillary refill takes less than 2 seconds.  Neurological:     General: No focal deficit present.     Mental Status: She is easily aroused.     Cranial Nerves: No cranial nerve deficit.     Sensory: No sensory deficit.     Motor: No weakness.  Psychiatric:        Behavior: Behavior is uncooperative.     ED Results / Procedures / Treatments   Labs (all labs ordered are listed, but only abnormal results are displayed) Labs Reviewed  CBC - Abnormal; Notable for the following components:      Result Value   WBC  12.6 (*)    All other components within normal limits  DIFFERENTIAL - Abnormal; Notable for the following components:   Neutro Abs 8.7 (*)    Monocytes  Absolute 1.2 (*)    All other components within normal limits  COMPREHENSIVE METABOLIC PANEL - Abnormal; Notable for the following components:   Potassium 3.1 (*)    Glucose, Bld 108 (*)    All other components within normal limits  SALICYLATE LEVEL - Abnormal; Notable for the following components:   Salicylate Lvl <1.6 (*)    All other components within normal limits  ACETAMINOPHEN LEVEL - Abnormal; Notable for the following components:   Acetaminophen (Tylenol), Serum <10 (*)    All other components within normal limits  CBG MONITORING, ED - Abnormal; Notable for the following components:   Glucose-Capillary 116 (*)    All other components within normal limits  SARS CORONAVIRUS 2 BY RT PCR (HOSPITAL ORDER, [place] [facility])  ETHANOL  PROTIME-INR  APTT    EKG None  Radiology CT HEAD CODE STROKE WO CONTRAST  Result Date: 12/20/2019 CLINICAL DATA:  Code stroke. Neuro deficit, acute, stroke suspected. Left-sided symptoms. Additional provided: Left-sided paralysis onset 6:40 p.m., history of seizures EXAM: CT HEAD WITHOUT CONTRAST TECHNIQUE: Contiguous axial images were obtained from the base of the skull through the vertex without intravenous contrast. COMPARISON:  Noncontrast head CT 03/15/2014. FINDINGS: Brain: Cerebral volume is normal for age. There is no acute intracranial hemorrhage. No demarcated cortical infarct is identified. No extra-axial fluid collection. No evidence of intracranial mass. No midline shift. Vascular: No hyperdense vessel. Skull: Normal. Negative for fracture or focal lesion. Sinuses/Orbits: Visualized orbits show no acute finding. Partially imaged right maxillary sinus mucous retention cyst. Mild mucosal thickening within the visualized left maxillary sinus. No significant mastoid effusion.  ASPECTS ([place] Stroke Program Early CT Score) - Ganglionic level infarction (caudate, lentiform nuclei, internal capsule, insula, M1-M3 cortex): 7 - Supraganglionic infarction (M4-M6 cortex): 3 Total score (0-10 with 10 being normal): 10 These results were called by telephone at the time of interpretation on 12/20/2019 at 7:25 pm to provider [facility] , who verbally acknowledged these results. IMPRESSION: No CT evidence of acute intracranial abnormality. ASPECTS is 10. Maxillary paranasal sinus disease as described. Electronically Signed   By: Kellie Simmering DO   On: 12/20/2019 19:25    Procedures Procedures (including critical care time)  Medications Ordered in ED Medications  levETIRAcetam (KEPPRA) IVPB 1000 mg/100 mL premix (0 mg Intravenous Stopped 12/20/19 2046)  fentaNYL (SUBLIMAZE) injection 50 mcg (50 mcg Intravenous Given 12/20/19 2040)    ED Course  I have reviewed the triage vital signs and the nursing notes.  Pertinent labs & imaging results that were available during my care of the patient were reviewed by me and considered in my medical decision making (see chart for details).  Clinical Course as of Dec 20 1045  Fri Dec 20, 2019  1927 Patient returning from scanner and now has no facial droop moving all extremities complaining of being cold appears postictal.  Received call from radiology that the stroke CT head looked unremarkable.  Will downgrade to neurology consult.   [MB]  1934 EKG is normal sinus rhythm rate of 75 normal intervals, no acute ST-T changes.   [MB]  2015 Discussed with teleneurology.  He agrees that this is probably seizure and postictal phase.  Recommended Keppra load.  He said if she improves where she can be safely discharged and sent her on Keppra twice daily otherwise obs her till her mental status clears.   [MB]  2159 Patient's nurse found out that the patient had been in [facility] last  evening and she just had her kids taken by child  protective services.  Will get TTS involved.   [MB]  2234 Patient has been out of bed and ambulatory without any difficulty in the department.  No signs of any neuro symptoms.   [MB]  2304 I was informed that the patient wants to go home now.  I told her she would need to call for a ride.  She denies any suicidal or homicidal ideation.   [MB]  2315 I was hoping the patient would stay long enough to get a ride.  Apparently she has eloped.  Do not feel she meets IVC criteria.   [MB]    Clinical Course User Index [MB] Hayden Rasmussen, MD   MDM Rules/Calculators/A&P                         This patient complains of change in mental status neuro deficits this involves an extensive number of treatment Options and is a complaint that carries with it a high risk of complications and Morbidity. The differential includes stroke, seizure, bleed, metabolic derangement, postictal state, intoxication  I ordered, reviewed and interpreted labs, which included CBC with mildly elevated white count, normal hemoglobin, chemistry with a mildly low potassium and elevated glucose, normal LFTs, alcohol level negative.  Urine pregnancy test and drug screen were ordered but patient never give a sample I ordered medication IV antiepileptics and IV pain medicine with improvement in her symptoms I ordered imaging studies which included stat head CT and I independently    visualized and interpreted imaging which showed no acute findings Additional history obtained from EMS Previous records obtained and reviewed in epic, no recent admissions for seizure or other neurologic complaints, has been seen for substance abuse I consulted teleneurology and discussed lab and imaging findings  Critical Interventions: Work-up of patient for acute neurologic findings.  On her return from CT she is now moving all extremities and somewhat conversant although tired appearing.  Question whether this was possibly a Todd's paralysis and  now she is just postictal.  She will continue to be observed in the department.  Later in the evening patient's nurse had some information from the patient's mother that her children have been taken away from her by DSS.  Still has not given a tox screen yet.  We will put in a consult for TTS for evaluation as there may be some psychiatric component to her condition.  Unfortunately patient eloped from the department prior to this happening.  I do not feel she meets IVC criteria at this point because she was not suicidal or homicidal.  She was unfortunately not given her prescription for Keppra to take as outpatient.  After the interventions stated above, I reevaluated the patient and found return to normal mental status.  Patient ultimately eloped from department.   Final Clinical Impression(s) / ED Diagnoses Final diagnoses:  Seizure ([place])  Substance abuse [facility])    Rx / DC Orders ED Discharge Orders    None       Hayden Rasmussen, MD 12/21/19 1053

## 2019-12-22 ENCOUNTER — Emergency Department (HOSPITAL_COMMUNITY)
Admission: EM | Admit: 2019-12-22 | Discharge: 2019-12-22 | Discharge status: Against Medical Advice | Payer: Medicaid Other | Attending: Emergency Medicine | Admitting: Emergency Medicine

## 2019-12-22 ENCOUNTER — Other Ambulatory Visit: Payer: Self-pay

## 2019-12-22 ENCOUNTER — Encounter (HOSPITAL_COMMUNITY): Payer: Self-pay | Admitting: Emergency Medicine

## 2019-12-22 DIAGNOSIS — F17210 Nicotine dependence, cigarettes, uncomplicated: Secondary | ICD-10-CM | POA: Diagnosis not present

## 2019-12-22 DIAGNOSIS — R197 Diarrhea, unspecified: Secondary | ICD-10-CM | POA: Diagnosis not present

## 2019-12-22 DIAGNOSIS — Z79899 Other long term (current) drug therapy: Secondary | ICD-10-CM | POA: Diagnosis not present

## 2019-12-22 DIAGNOSIS — R1084 Generalized abdominal pain: Secondary | ICD-10-CM | POA: Insufficient documentation

## 2019-12-22 DIAGNOSIS — J45909 Unspecified asthma, uncomplicated: Secondary | ICD-10-CM | POA: Diagnosis not present

## 2019-12-22 DIAGNOSIS — E876 Hypokalemia: Secondary | ICD-10-CM | POA: Diagnosis not present

## 2019-12-22 DIAGNOSIS — Z7951 Long term (current) use of inhaled steroids: Secondary | ICD-10-CM | POA: Diagnosis not present

## 2019-12-22 DIAGNOSIS — R112 Nausea with vomiting, unspecified: Secondary | ICD-10-CM | POA: Diagnosis not present

## 2019-12-22 DIAGNOSIS — R4182 Altered mental status, unspecified: Secondary | ICD-10-CM | POA: Diagnosis present

## 2019-12-22 LAB — CBC WITH DIFFERENTIAL/PLATELET
Abs Immature Granulocytes: 0.02 10*3/uL (ref 0.00–0.07)
Basophils Absolute: 0 10*3/uL (ref 0.0–0.1)
Basophils Relative: 0 %
Eosinophils Absolute: 0 10*3/uL (ref 0.0–0.5)
Eosinophils Relative: 0 %
HCT: 44 % (ref 36.0–46.0)
Hemoglobin: 15.2 g/dL — ABNORMAL HIGH (ref 12.0–15.0)
Immature Granulocytes: 0 %
Lymphocytes Relative: 38 %
Lymphs Abs: 4.3 10*3/uL — ABNORMAL HIGH (ref 0.7–4.0)
MCH: 32.3 pg (ref 26.0–34.0)
MCHC: 34.5 g/dL (ref 30.0–36.0)
MCV: 93.6 fL (ref 80.0–100.0)
Monocytes Absolute: 1 10*3/uL (ref 0.1–1.0)
Monocytes Relative: 9 %
Neutro Abs: 6 10*3/uL (ref 1.7–7.7)
Neutrophils Relative %: 53 %
Platelets: 341 10*3/uL (ref 150–400)
RBC: 4.7 MIL/uL (ref 3.87–5.11)
RDW: 12.3 % (ref 11.5–15.5)
WBC: 11.5 10*3/uL — ABNORMAL HIGH (ref 4.0–10.5)
nRBC: 0 % (ref 0.0–0.2)

## 2019-12-22 LAB — LIPASE, BLOOD: Lipase: 27 U/L (ref 11–51)

## 2019-12-22 LAB — COMPREHENSIVE METABOLIC PANEL
ALT: 21 U/L (ref 0–44)
AST: 28 U/L (ref 15–41)
Albumin: 4.6 g/dL (ref 3.5–5.0)
Alkaline Phosphatase: 65 U/L (ref 38–126)
Anion gap: 12 (ref 5–15)
BUN: 13 mg/dL (ref 6–20)
CO2: 26 mmol/L (ref 22–32)
Calcium: 9.7 mg/dL (ref 8.9–10.3)
Chloride: 99 mmol/L (ref 98–111)
Creatinine, Ser: 0.75 mg/dL (ref 0.44–1.00)
GFR calc Af Amer: >60 mL/min (ref >60)
GFR calc non Af Amer: >60 mL/min (ref >60)
Glucose, Bld: 128 mg/dL — ABNORMAL HIGH (ref 70–99)
Potassium: 3 mmol/L — ABNORMAL LOW (ref 3.5–5.1)
Sodium: 137 mmol/L (ref 135–145)
Total Bilirubin: 0.8 mg/dL (ref 0.3–1.2)
Total Protein: 8.4 g/dL — ABNORMAL HIGH (ref 6.5–8.1)

## 2019-12-22 MED ORDER — SODIUM CHLORIDE 0.9 % IV BOLUS
1000.0000 mL | Freq: Once | INTRAVENOUS | Status: AC
  Administered 2019-12-22: 1000 mL via INTRAVENOUS

## 2019-12-22 MED ORDER — DROPERIDOL 2.5 MG/ML IJ SOLN
2.5000 mg | Freq: Once | INTRAMUSCULAR | Status: AC
  Administered 2019-12-22: 2.5 mg via INTRAVENOUS
  Filled 2019-12-22: qty 2

## 2019-12-22 NOTE — ED Triage Notes (Signed)
Upon retrieving pt from waiting room pt was lethargic and slumped in the wheelchair.  Pt reports abdominal pain and emesis x 3 days.

## 2019-12-22 NOTE — ED Notes (Signed)
While RN was obtaining IV access and blood samples, Pt admitted to RN she has been snorting heroin for past "5-6 months, and I last used 4 days ago and think I'm withdrawing." MD Notified.

## 2019-12-22 NOTE — ED Notes (Signed)
Attempted to get pt to drink in order to get urine specimen. Raised head of bed and pt immediately lowers it back down without coming out from underneath her covers.

## 2019-12-22 NOTE — ED Notes (Signed)
Pt removed heart monitor, bp cuff and pulse ox and refusing to put it back on. Pt took pants off and threw them in the floor. Covered herself up with personal blanket.

## 2019-12-22 NOTE — ED Provider Notes (Signed)
[facility] EMERGENCY DEPARTMENT Provider Note   CSN: [id number] Arrival date & time: 12/22/19  0356     History Chief Complaint  Patient presents with  . Abdominal Pain    Janet Dixon is a 34 y.o. female.  HPI     This is a 34 year old female with a history of asthma, seizure disorder, cyclic vomiting who presents initially with altered mental status.  She was brought back from triage for altered mental status and decreased level of consciousness.  However, upon arrival to the room, she woke immediately and stated "I am sorry."  She reports 2 to 3-day history of worsening crampy upper abdominal pain.  She reports multiple episodes of nonbilious, nonbloody emesis and diarrhea.  She states that over the last several months she has been regularly using heroin and has not used in the last 3 to 4 days.  Since that time she has had worsening symptoms and thinks she may be in withdrawal.  She has not taken anything for her symptoms.  Denies any recent Covid exposures or sick contacts.  Denies alcohol use.  Denies any recent marijuana use.  Of note, she was seen and evaluated on 9/10 for altered mental status.  At that time she was thought to potentially be post ictal.  Additionally, there seemed to be some social stressors and some thought that her presentation may have a psychiatric component.  She was not homicidal or suicidal and ultimately eloped.  Past Medical History:  Diagnosis Date  . Asthma   . History of chlamydia   . History of gonorrhea   . History of trichomoniasis   . Seizures [facility])     Patient Active Problem List   Diagnosis Date Noted  . Vaginal discharge 08/21/2019  . Screening examination for STD (sexually transmitted disease) 08/21/2019  . Pregnancy examination or test, negative result 08/21/2019  . 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 Intractable nausea and vomiting 02/16/2019  . Abdominal pain   . Urinary tract infection without hematuria   . Intractable vomiting   .  Pyelonephritis 08/04/2018  . Status post repeat low transverse cesarean section 12/09/2017  . Drug use affecting pregnancy 06/08/2017  . Elevated lipase 04/14/2017  . Stomach pain 04/14/2017  . History of C-section 04/14/2017  . Hematuria 12/07/2016  . History of chlamydia 12/07/2016  . Papanicolaou smear of cervix with positive high risk human papilloma virus (HPV) test 09/07/2016  . Trichimoniasis 08/29/2016  . Seizure disorder ([place]) 05/01/2014  . Cocaine abuse ([place]) 03/14/2014    Past Surgical History:  Procedure Laterality Date  . CESAREAN SECTION    . CESAREAN SECTION N/A 12/02/2014   Procedure: REPEAT CESAREAN SECTION;  Surgeon: Jonnie Kind, MD;  Location: [facility];  Service: Obstetrics;  Laterality: N/A;  . CESAREAN SECTION N/A 12/09/2017   Procedure: CESAREAN SECTION;  Surgeon: Jonnie Kind, MD;  Location: [place];  Service: Obstetrics;  Laterality: N/A;     OB History    Gravida  8   Para  7   Term  7   Preterm      AB  0   Living  7     SAB  0   TAB      Ectopic      Multiple  0   Live Births  7           Family History  Problem Relation Age of Onset  . Hypertension Mother   . Asthma Brother   .  Asthma Daughter   . Eczema Daughter   . Asthma Son   . Eczema Son   . Aneurysm Maternal Grandmother   . Heart disease Maternal Grandmother   . Cancer Maternal Grandmother   . Asthma Daughter   . Eczema Daughter   . Asthma Son   . Eczema Son   . Eczema Son   . Cancer Maternal Grandfather        colon  . Eczema Maternal Aunt     Social History   Tobacco Use  . Smoking status: Current Every Day Smoker    Packs/day: 0.50    Years: 13.00    Pack years: 6.50    Types: Cigarettes  . Smokeless tobacco: Never Used  . Tobacco comment: 1-2 per day  Vaping Use  . Vaping Use: Never used  Substance Use Topics  . Alcohol use: No    Comment: none x 1 month ago  . Drug use: Yes    Types: Codeine, IV    Home Medications Prior  to Admission medications   Medication Sig Start Date End Date Taking? Authorizing Provider  albuterol (PROVENTIL HFA;VENTOLIN HFA) 108 (90 Base) MCG/ACT inhaler Inhale 2 puffs into the lungs every 6 (six) hours as needed for wheezing or shortness of breath. 08/29/16   Estill Dooms, NP  levETIRAcetam (KEPPRA) 500 MG tablet Take 1 tablet (500 mg total) by mouth 2 (two) times daily. 12/20/19   Hayden Rasmussen, MD  metroNIDAZOLE (FLAGYL) 500 MG tablet Take 1 tablet (500 mg total) by mouth 2 (two) times daily. 08/21/19   Estill Dooms, NP  potassium chloride (K-DUR) 10 MEQ tablet Take 1 tablet (10 mEq total) by mouth 2 (two) times daily for 3 days. Patient not taking: Reported on 11/08/2018 08/05/18 01/09/19  Harriet Butte, DO    Allergies    Macrobid [nitrofurantoin macrocrystal], Diphenhydramine hcl, Ibuprofen, and Flexeril [cyclobenzaprine hcl]  Review of Systems   Review of Systems  Constitutional: Negative for fever.  Respiratory: Negative for shortness of breath.   Cardiovascular: Negative for chest pain.  Gastrointestinal: Positive for abdominal pain, diarrhea, nausea and vomiting. Negative for blood in stool and constipation.  Genitourinary: Negative for dysuria.  All other systems reviewed and are negative.   Physical Exam Updated Vital Signs BP 114/72 (BP Location: Left Arm)   Pulse 70   Temp 98.8 F (37.1 C) (Oral)   Resp 20   Ht 1.524 m (5')   Wt 58 kg   SpO2 100%   BMI 24.97 kg/m   Physical Exam Vitals and nursing note reviewed.  Constitutional:      Appearance: She is well-developed. She is not ill-appearing.  HENT:     Head: Normocephalic and atraumatic.     Mouth/Throat:     Mouth: Mucous membranes are moist.  Eyes:     Pupils: Pupils are equal, round, and reactive to light.  Cardiovascular:     Rate and Rhythm: Normal rate and regular rhythm.     Heart sounds: Normal heart sounds.  Pulmonary:     Effort: Pulmonary effort is normal. No  respiratory distress.     Breath sounds: No wheezing.  Abdominal:     General: Bowel sounds are normal.     Palpations: Abdomen is soft.     Tenderness: There is no abdominal tenderness. There is no guarding or rebound.  Musculoskeletal:     Cervical back: Neck supple.  Skin:    General: Skin is warm and dry.  Neurological:     Mental Status: She is alert and oriented to person, place, and time.  Psychiatric:     Comments: Anxious appearing but nontoxic, at times somnolent but arousable and oriented     ED Results / Procedures / Treatments   Labs (all labs ordered are listed, but only abnormal results are displayed) Labs Reviewed  CBC WITH DIFFERENTIAL/PLATELET - Abnormal; Notable for the following components:      Result Value   WBC 11.5 (*)    Hemoglobin 15.2 (*)    Lymphs Abs 4.3 (*)    All other components within normal limits  COMPREHENSIVE METABOLIC PANEL - Abnormal; Notable for the following components:   Potassium 3.0 (*)    Glucose, Bld 128 (*)    Total Protein 8.4 (*)    All other components within normal limits  LIPASE, BLOOD  URINALYSIS, ROUTINE W REFLEX MICROSCOPIC  PREGNANCY, URINE  RAPID URINE DRUG SCREEN, HOSP PERFORMED    EKG None  Radiology CT HEAD CODE STROKE WO CONTRAST  Result Date: 12/20/2019 CLINICAL DATA:  Code stroke. Neuro deficit, acute, stroke suspected. Left-sided symptoms. Additional provided: Left-sided paralysis onset 6:40 p.m., history of seizures EXAM: CT HEAD WITHOUT CONTRAST TECHNIQUE: Contiguous axial images were obtained from the base of the skull through the vertex without intravenous contrast. COMPARISON:  Noncontrast head CT 03/15/2014. FINDINGS: Brain: Cerebral volume is normal for age. There is no acute intracranial hemorrhage. No demarcated cortical infarct is identified. No extra-axial fluid collection. No evidence of intracranial mass. No midline shift. Vascular: No hyperdense vessel. Skull: Normal. Negative for fracture or  focal lesion. Sinuses/Orbits: Visualized orbits show no acute finding. Partially imaged right maxillary sinus mucous retention cyst. Mild mucosal thickening within the visualized left maxillary sinus. No significant mastoid effusion. ASPECTS ([place] Stroke Program Early CT Score) - Ganglionic level infarction (caudate, lentiform nuclei, internal capsule, insula, M1-M3 cortex): 7 - Supraganglionic infarction (M4-M6 cortex): 3 Total score (0-10 with 10 being normal): 10 These results were called by telephone at the time of interpretation on 12/20/2019 at 7:25 pm to provider [facility] , who verbally acknowledged these results. IMPRESSION: No CT evidence of acute intracranial abnormality. ASPECTS is 10. Maxillary paranasal sinus disease as described. Electronically Signed   By: Kellie Simmering DO   On: 12/20/2019 19:25    Procedures Procedures (including critical care time)  Medications Ordered in ED Medications  sodium chloride 0.9 % bolus 1,000 mL (0 mLs Intravenous Stopped 12/22/19 0554)  droperidol (INAPSINE) 2.5 MG/ML injection 2.5 mg (2.5 mg Intravenous Given 12/22/19 0446)    ED Course  I have reviewed the triage vital signs and the nursing notes.  Pertinent labs & imaging results that were available during my care of the patient were reviewed by me and considered in my medical decision making (see chart for details).  Clinical Course as of Dec 21 657  Sun Dec 22, 2019  0657 Patient is sleeping in the hallway.  She will not answer questions when asked and will not open her eyes.  I discussed with her that in order to further help her, she needs to speak with me but she does not appear to want to participate in her care.  I do need a urinalysis and urine pregnancy test complete evaluation.  She has been able to tolerate some fluids after droperidol.  Patient nods her head that she can provide a urine sample.  Anticipate that if this is negative and reassuring she will be able to  be discharged.     [CH]    Clinical Course User Index [CH] Cloe Sockwell, Barbette Hair, MD   MDM Rules/Calculators/A&P                          Patient presents with abdominal pain and vomiting.  Initially was seen to have an altered mental status but this resolved without issue although she became fairly uncooperative with her care.  Question whether there may be a psychosomatic component.  She is overall nontoxic and vital signs are reassuring.  Her abdominal exam is largely nontender.  Lab work obtained and basic lab work shows no significant metabolic derangement except mild hypokalemia.  Doubt cholecystitis or pancreatitis.  Could be continued cyclic vomiting.  I have requested a urinalysis and urine pregnancy test but patient is fairly uncooperative.  She does state that she did provide urine.  She has already tolerated fluids without difficulty after IV fluids and droperidol.  Anticipate that she will be able to be discharged home after urinalysis.  Signed out to oncoming provider.  Final Clinical Impression(s) / ED Diagnoses Final diagnoses:  Generalized abdominal pain  Hypokalemia    Rx / DC Orders ED Discharge Orders    None       Merryl Hacker, MD 12/22/19 [phone]

## 2019-12-22 NOTE — ED Notes (Signed)
Attempted to arouse pt to provide fluids. Pt sat up, stared at me and laid back down. Water left at bedside.

## 2019-12-22 NOTE — Discharge Instructions (Addendum)
You were seen today for abdominal pain and vomiting.  Your work-up is reassuring.  You were noted to have a slightly low potassium.  Increase your diet with potassium rich fluids.

## 2019-12-23 ENCOUNTER — Telehealth: Payer: Self-pay | Admitting: *Deleted

## 2019-12-23 DIAGNOSIS — R1084 Generalized abdominal pain: Secondary | ICD-10-CM

## 2019-12-23 NOTE — Telephone Encounter (Signed)
Emailed request to [facility] to schedule  NP appointment .                               Lequisha Cammack                                     PEC                               [phone]

## 2019-12-23 NOTE — Telephone Encounter (Signed)
Patient presented to [facility] 12/22/19, and left AMA. Order placed for [facility]ination due to 2 ED visits in less than 2 weeks.

## 2019-12-23 NOTE — Telephone Encounter (Signed)
Transition Care Management Unsuccessful Follow-up Telephone Call  Date of discharge and from where:  12/22/19, [facility]  Attempts:  1st Attempt  Reason for unsuccessful TCM follow-up call:  Left voice message.   Order placed for [facility] due to patient having 2 ED visits in less than 2 weeks.

## 2019-12-27 ENCOUNTER — Emergency Department (HOSPITAL_COMMUNITY)
Admission: EM | Admit: 2019-12-27 | Discharge: 2019-12-27 | Disposition: A | Discharge status: Home / Self Care | Payer: Medicaid Other | Attending: Emergency Medicine | Admitting: Emergency Medicine

## 2019-12-27 ENCOUNTER — Other Ambulatory Visit: Payer: Self-pay

## 2019-12-27 ENCOUNTER — Encounter (HOSPITAL_COMMUNITY): Payer: Self-pay | Admitting: Emergency Medicine

## 2019-12-27 DIAGNOSIS — F1110 Opioid abuse, uncomplicated: Secondary | ICD-10-CM | POA: Diagnosis not present

## 2019-12-27 DIAGNOSIS — R197 Diarrhea, unspecified: Secondary | ICD-10-CM | POA: Diagnosis not present

## 2019-12-27 DIAGNOSIS — F17210 Nicotine dependence, cigarettes, uncomplicated: Secondary | ICD-10-CM | POA: Diagnosis not present

## 2019-12-27 DIAGNOSIS — Z79899 Other long term (current) drug therapy: Secondary | ICD-10-CM | POA: Insufficient documentation

## 2019-12-27 DIAGNOSIS — R109 Unspecified abdominal pain: Secondary | ICD-10-CM | POA: Diagnosis present

## 2019-12-27 DIAGNOSIS — R1084 Generalized abdominal pain: Secondary | ICD-10-CM

## 2019-12-27 DIAGNOSIS — Z7951 Long term (current) use of inhaled steroids: Secondary | ICD-10-CM | POA: Diagnosis not present

## 2019-12-27 DIAGNOSIS — J45909 Unspecified asthma, uncomplicated: Secondary | ICD-10-CM | POA: Diagnosis not present

## 2019-12-27 LAB — CBC WITH DIFFERENTIAL/PLATELET
Abs Immature Granulocytes: 0.02 10*3/uL (ref 0.00–0.07)
Basophils Absolute: 0 10*3/uL (ref 0.0–0.1)
Basophils Relative: 1 %
Eosinophils Absolute: 0 10*3/uL (ref 0.0–0.5)
Eosinophils Relative: 0 %
HCT: 40.8 % (ref 36.0–46.0)
Hemoglobin: 13.8 g/dL (ref 12.0–15.0)
Immature Granulocytes: 0 %
Lymphocytes Relative: 40 %
Lymphs Abs: 2.3 10*3/uL (ref 0.7–4.0)
MCH: 32.2 pg (ref 26.0–34.0)
MCHC: 33.8 g/dL (ref 30.0–36.0)
MCV: 95.3 fL (ref 80.0–100.0)
Monocytes Absolute: 0.6 10*3/uL (ref 0.1–1.0)
Monocytes Relative: 11 %
Neutro Abs: 2.8 10*3/uL (ref 1.7–7.7)
Neutrophils Relative %: 48 %
Platelets: 329 10*3/uL (ref 150–400)
RBC: 4.28 MIL/uL (ref 3.87–5.11)
RDW: 13.1 % (ref 11.5–15.5)
WBC: 5.8 10*3/uL (ref 4.0–10.5)
nRBC: 0 % (ref 0.0–0.2)

## 2019-12-27 LAB — COMPREHENSIVE METABOLIC PANEL
ALT: 18 U/L (ref 0–44)
AST: 19 U/L (ref 15–41)
Albumin: 3.9 g/dL (ref 3.5–5.0)
Alkaline Phosphatase: 60 U/L (ref 38–126)
Anion gap: 11 (ref 5–15)
BUN: 11 mg/dL (ref 6–20)
CO2: 23 mmol/L (ref 22–32)
Calcium: 8.7 mg/dL — ABNORMAL LOW (ref 8.9–10.3)
Chloride: 101 mmol/L (ref 98–111)
Creatinine, Ser: 0.68 mg/dL (ref 0.44–1.00)
GFR calc Af Amer: >60 mL/min (ref >60)
GFR calc non Af Amer: >60 mL/min (ref >60)
Glucose, Bld: 101 mg/dL — ABNORMAL HIGH (ref 70–99)
Potassium: 3.8 mmol/L (ref 3.5–5.1)
Sodium: 135 mmol/L (ref 135–145)
Total Bilirubin: 0.4 mg/dL (ref 0.3–1.2)
Total Protein: 7.5 g/dL (ref 6.5–8.1)

## 2019-12-27 LAB — LIPASE, BLOOD: Lipase: 35 U/L (ref 11–51)

## 2019-12-27 LAB — I-STAT BETA HCG BLOOD, ED (MC, WL, AP ONLY): I-stat hCG, quantitative: <5 m[IU]/mL (ref <5)

## 2019-12-27 MED ORDER — HALOPERIDOL LACTATE 5 MG/ML IJ SOLN
2.0000 mg | Freq: Once | INTRAMUSCULAR | Status: AC
  Administered 2019-12-27: 2 mg via INTRAVENOUS
  Filled 2019-12-27: qty 1

## 2019-12-27 MED ORDER — SODIUM CHLORIDE 0.9 % IV BOLUS
1000.0000 mL | Freq: Once | INTRAVENOUS | Status: AC
  Administered 2019-12-27: 1000 mL via INTRAVENOUS

## 2019-12-27 MED ORDER — METOCLOPRAMIDE HCL 10 MG PO TABS: 10.0000 mg | ORAL_TABLET | Freq: Three times a day (TID) | ORAL | 0 refills | Status: DC | PRN

## 2019-12-27 MED ORDER — ONDANSETRON HCL 4 MG/2ML IJ SOLN
4.0000 mg | Freq: Once | INTRAMUSCULAR | Status: AC
  Administered 2019-12-27: 4 mg via INTRAVENOUS
  Filled 2019-12-27: qty 2

## 2019-12-27 NOTE — ED Triage Notes (Signed)
Pt c/o covid symptoms and wanting to detox. RN's were called to assess pt in waiting room before pt was triaged, pt's mother stating she was having seizure. Pt unarousable in waiting room and but was woke up as soon as she was placed in a treatment room. Pt A&O x4

## 2019-12-27 NOTE — Discharge Instructions (Signed)
Drink plenty of fluids (clear liquids) then start a bland diet later this morning such as toast, crackers, jello, [employer] chicken noodle soup. Use the Reglan for nausea or vomiting. Take imodium OTC for diarrhea. Avoid milk products until the diarrhea is gone.  Look at the resource guide or go to [facility] to get help with your drug problem.

## 2019-12-27 NOTE — ED Provider Notes (Signed)
[facility] EMERGENCY DEPARTMENT Provider Note   CSN: [id number] Arrival date & time: 12/27/19  0444   Time seen 5:25 AM  History Chief Complaint  Patient presents with  . Detox    Janet Dixon is a 34 y.o. female.  HPI   This is the patient's third ED visit since September 10.  She has been very uncooperative her prior 2 visits.  During 1 of those visits it was revealed by patient's mother that her children had been taken away from her from [place] on September 10.  She was at [facility] at the time.  She presented here also on September 10 with possible stroke and was a code stroke on arrival and was possibly a seizure.  She returned on September 12 when she presented being lethargic and slumped over however she walked out the ED when she was placed back in her room.  She related that time she had been doing heroin up until couple days before hand.  Denied her mother brings her in to the ED and states "she has Covid symptoms".  Patient states her mother said that she did not.  She states she is here because she has been having abdominal pain diffusely with vomiting for a week.  She states she is vomiting over 6 times a day without blood.  She has mild diarrhea.  She denies fever.  She denies cough, sore throat, rhinorrhea, chest pain, shortness of breath loss of sense of taste or smell.  She told me her last heroin use was in August which contradicts what she was telling nursing staff 2 days ago.  She states she has seizures 3-4 times a year.  When she arrived to the ED tonight patient stated she thought she was going to have a seizure and then the mother told registration she was having a seizure.  When the nursing staff went out there she was not responding however she started talking as soon as they put her in the room.  Patient does admit to using marijuana on a regular basis.  She states she is only been doing the heroin about 5 or 6 months and she sniffs it as she puts it.  She told me  she had not done it since August but she indicated that she had been using it up to to a few days ago when she was seen on August 12.  PCP Patient, No Pcp Per   PCP Patient, No Pcp Per   Past Medical History:  Diagnosis Date  . Asthma   . History of chlamydia   . History of gonorrhea   . History of trichomoniasis   . Seizures [facility])     Patient Active Problem List   Diagnosis Date Noted  . Vaginal discharge 08/21/2019  . Screening examination for STD (sexually transmitted disease) 08/21/2019  . Pregnancy examination or test, negative result 08/21/2019  . 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 Intractable nausea and vomiting 02/16/2019  . Abdominal pain   . Urinary tract infection without hematuria   . Intractable vomiting   . Pyelonephritis 08/04/2018  . Status post repeat low transverse cesarean section 12/09/2017  . Drug use affecting pregnancy 06/08/2017  . Elevated lipase 04/14/2017  . Stomach pain 04/14/2017  . History of C-section 04/14/2017  . Hematuria 12/07/2016  . History of chlamydia 12/07/2016  . Papanicolaou smear of cervix with positive high risk human papilloma virus (HPV) test 09/07/2016  . Trichimoniasis 08/29/2016  . Seizure disorder ([place]) 05/01/2014  .  Cocaine abuse ([place]) 03/14/2014    Past Surgical History:  Procedure Laterality Date  . CESAREAN SECTION    . CESAREAN SECTION N/A 12/02/2014   Procedure: REPEAT CESAREAN SECTION;  Surgeon: Jonnie Kind, MD;  Location: [place] [facility];  Service: Obstetrics;  Laterality: N/A;  . CESAREAN SECTION N/A 12/09/2017   Procedure: CESAREAN SECTION;  Surgeon: Jonnie Kind, MD;  Location: [place];  Service: Obstetrics;  Laterality: N/A;     OB History    Gravida  8   Para  7   Term  7   Preterm      AB  0   Living  7     SAB  0   TAB      Ectopic      Multiple  0   Live Births  7           Family History  Problem Relation Age of Onset  . Hypertension Mother   . Asthma Brother   .  Asthma Daughter   . Eczema Daughter   . Asthma Son   . Eczema Son   . Aneurysm Maternal Grandmother   . Heart disease Maternal Grandmother   . Cancer Maternal Grandmother   . Asthma Daughter   . Eczema Daughter   . Asthma Son   . Eczema Son   . Eczema Son   . Cancer Maternal Grandfather        colon  . Eczema Maternal Aunt     Social History   Tobacco Use  . Smoking status: Current Every Day Smoker    Packs/day: 0.50    Years: 13.00    Pack years: 6.50    Types: Cigarettes  . Smokeless tobacco: Never Used  . Tobacco comment: 1-2 per day  Vaping Use  . Vaping Use: Never used  Substance Use Topics  . Alcohol use: No    Comment: none x 1 month ago  . Drug use: Yes    Types: Codeine, IV    Home Medications Prior to Admission medications   Medication Sig Start Date End Date Taking? Authorizing Provider  albuterol (PROVENTIL HFA;VENTOLIN HFA) 108 (90 Base) MCG/ACT inhaler Inhale 2 puffs into the lungs every 6 (six) hours as needed for wheezing or shortness of breath. 08/29/16   Estill Dooms, NP  levETIRAcetam (KEPPRA) 500 MG tablet Take 1 tablet (500 mg total) by mouth 2 (two) times daily. 12/20/19   Hayden Rasmussen, MD  metoCLOPramide (REGLAN) 10 MG tablet Take 1 tablet (10 mg total) by mouth every 8 (eight) hours as needed for nausea or vomiting. 12/27/19   Rolland Porter, MD  metroNIDAZOLE (FLAGYL) 500 MG tablet Take 1 tablet (500 mg total) by mouth 2 (two) times daily. 08/21/19   Estill Dooms, NP  potassium chloride (K-DUR) 10 MEQ tablet Take 1 tablet (10 mEq total) by mouth 2 (two) times daily for 3 days. Patient not taking: Reported on 11/08/2018 08/05/18 01/09/19  Harriet Butte, DO    Allergies    Macrobid [nitrofurantoin macrocrystal], Diphenhydramine hcl, Ibuprofen, and Flexeril [cyclobenzaprine hcl]  Review of Systems   Review of Systems  All other systems reviewed and are negative.   Physical Exam Updated Vital Signs BP 121/76   Pulse 63   Temp  98.4 F (36.9 C)   Resp 18   Ht 5' (1.524 m)   Wt 58 kg   SpO2 98%   BMI 24.97 kg/m   Physical Exam Vitals and nursing  note reviewed.  Constitutional:      Appearance: Normal appearance. She is normal weight.     Comments: When I enter the room patient is laying on her abdomen, she is not wearing a mask.  I got her mask and told her she had wear a mask in the hospital where she would be asked to leave.  Patient has her own home blanket with her and is without clothing underneath it.  HENT:     Head: Normocephalic and atraumatic.     Right Ear: External ear normal.     Left Ear: External ear normal.  Eyes:     Extraocular Movements: Extraocular movements intact.     Conjunctiva/sclera: Conjunctivae normal.  Cardiovascular:     Rate and Rhythm: Normal rate and regular rhythm.     Pulses: Normal pulses.     Heart sounds: Normal heart sounds.  Pulmonary:     Effort: Pulmonary effort is normal. No respiratory distress.     Breath sounds: Normal breath sounds.  Abdominal:     General: Abdomen is flat. Bowel sounds are normal.     Palpations: Abdomen is soft.     Tenderness: There is no abdominal tenderness.     Comments: When I examined patient's abdomen while talking she does not display any evidence of pain.  And when I palpate her abdomen she states it "hurts a little".  Musculoskeletal:        General: Normal range of motion.     Cervical back: Normal range of motion.  Skin:    General: Skin is warm and dry.  Neurological:     General: No focal deficit present.     Mental Status: She is alert and oriented to person, place, and time.     Cranial Nerves: No cranial nerve deficit.  Psychiatric:        Mood and Affect: Mood normal.        Behavior: Behavior normal.        Thought Content: Thought content normal.     ED Results / Procedures / Treatments   Labs (all labs ordered are listed, but only abnormal results are displayed) Results for orders placed or performed  during the hospital encounter of 12/27/19  Comprehensive metabolic panel  Result Value Ref Range   Sodium 135 135 - 145 mmol/L   Potassium 3.8 3.5 - 5.1 mmol/L   Chloride 101 98 - 111 mmol/L   CO2 23 22 - 32 mmol/L   Glucose, Bld 101 (H) 70 - 99 mg/dL   BUN 11 6 - 20 mg/dL   Creatinine, Ser 0.68 0.44 - 1.00 mg/dL   Calcium 8.7 (L) 8.9 - 10.3 mg/dL   Total Protein 7.5 6.5 - 8.1 g/dL   Albumin 3.9 3.5 - 5.0 g/dL   AST 19 15 - 41 U/L   ALT 18 0 - 44 U/L   Alkaline Phosphatase 60 38 - 126 U/L   Total Bilirubin 0.4 0.3 - 1.2 mg/dL   GFR calc non Af Amer >60 >60 mL/min   GFR calc Af Amer >60 >60 mL/min   Anion gap 11 5 - 15  Lipase, blood  Result Value Ref Range   Lipase 35 11 - 51 U/L  CBC with Differential  Result Value Ref Range   WBC 5.8 4.0 - 10.5 K/uL   RBC 4.28 3.87 - 5.11 MIL/uL   Hemoglobin 13.8 12.0 - 15.0 g/dL   HCT 40.8 36 - 46 %   MCV  95.3 80.0 - 100.0 fL   MCH 32.2 26.0 - 34.0 pg   MCHC 33.8 30.0 - 36.0 g/dL   RDW 13.1 11.5 - 15.5 %   Platelets 329 150 - 400 K/uL   nRBC 0.0 0.0 - 0.2 %   Neutrophils Relative % 48 %   Neutro Abs 2.8 1.7 - 7.7 K/uL   Lymphocytes Relative 40 %   Lymphs Abs 2.3 0.7 - 4.0 K/uL   Monocytes Relative 11 %   Monocytes Absolute 0.6 0 - 1 K/uL   Eosinophils Relative 0 %   Eosinophils Absolute 0.0 0 - 0 K/uL   Basophils Relative 1 %   Basophils Absolute 0.0 0 - 0 K/uL   Immature Granulocytes 0 %   Abs Immature Granulocytes 0.02 0.00 - 0.07 K/uL  I-Stat Beta hCG blood, ED ([facility], [facility], [facility] only)  Result Value Ref Range   I-stat hCG, quantitative <5.0 <5 mIU/mL   Comment 3            Laboratory interpretation all normal except patient has not provided a urine sample   EKG None  Radiology No results found.   CT HEAD CODE STROKE WO CONTRAST  Result Date: 12/20/2019 CLINICAL DATA:  Code stroke. Neuro deficit, acute, stroke suspected. Left-sided symptoms. Additional provided: Left-sided paralysis onset 6:40 p.m., history of seizures   IMPRESSION: No CT evidence of acute intracranial abnormality. ASPECTS is 10. Maxillary paranasal sinus disease as described. Electronically Signed   By: Kellie Simmering DO   On: 12/20/2019 19:25    Procedures Procedures (including critical care time)  Medications Ordered in ED Medications  sodium chloride 0.9 % bolus 1,000 mL (1,000 mLs Intravenous New Bag/Given 12/27/19 0553)  ondansetron (ZOFRAN) injection 4 mg (4 mg Intravenous Given 12/27/19 0554)  haloperidol lactate (HALDOL) injection 2 mg (2 mg Intravenous Given 12/27/19 0554)    ED Course  I have reviewed the triage vital signs and the nursing notes.  Pertinent labs & imaging results that were available during my care of the patient were reviewed by me and considered in my medical decision making (see chart for details).    MDM Rules/Calculators/A&P                          I talked to the patient and told her I looked at her chart and she had been very uncooperative her last 2 ED visits.  I explained to her that the first thing she did that was uncooperative she would be asked to leave the ED and patient states she will cooperate.  She was given IV fluids and IV nausea medication.  Due to her admitting to smoking marijuana she was given Haldol for possible hyperemesis from marijuana.  Recheck at 7:00 AM patient is laying on her abdomen sleeping.  She is hard to keep awake to talk to.  She states however her abdominal pain and nausea is better.  She has had her IV fluids.  However she states she does not feel well enough to go home yet.  She has not provided a urine sample yet.  Dr Wilson Singer made aware of patient at change of shift.   Final Clinical Impression(s) / ED Diagnoses Final diagnoses:  Abdominal pain, generalized  Nausea vomiting and diarrhea  Heroin abuse ([place])    Rx / DC Orders ED Discharge Orders         Ordered    metoCLOPramide (REGLAN) 10 MG tablet  Every  8 hours PRN        12/27/19 6067          Disposition  pending  Rolland Porter, MD, Barbette Or, MD 12/27/19 [phone]

## 2019-12-30 ENCOUNTER — Other Ambulatory Visit: Payer: Self-pay

## 2019-12-30 ENCOUNTER — Ambulatory Visit: Payer: Self-pay

## 2019-12-30 NOTE — Patient Instructions (Addendum)
Visit Information  Janet Dixon  - as a part of your Medicaid benefit, you are eligible for care management and care coordination services. I was unable to reach you by phone today but would be happy to help you with your health related needs. Please feel free to call me [phone].   The Managed Medicaid care management team will reach out to the patient again over the next 30 days.  Appointment is scheduled for January 09, 2020 @ 1:30pm.   [name], MSW, [place]: [phone]

## 2019-12-30 NOTE — Patient Outreach (Signed)
Care Coordination  12/30/2019  Janet Dixon Mar 07, 1986 [id number]   Visit Information  Janet Dixon  - as a part of your Medicaid benefit, you are eligible for care management and care coordination services. I was unable to reach you by phone today but would be happy to help you with your health related needs. Please feel free to call me @ [phone].   The Managed Medicaid care management team will reach out to the patient again over the next 7-14 days days.  The patient has been provided with contact information for the Managed Medicaid care management team and has been advised to call with any health related questions or concerns.    [name], MSW, LCSW Social Work Case Freight forwarder - [place]  Direct [place]: [phone]

## 2020-01-09 ENCOUNTER — Other Ambulatory Visit: Payer: Self-pay

## 2020-01-09 NOTE — Patient Instructions (Signed)
A second unsuccessful telephone outreach was attempted today. The patient was referred to the case management team for assistance with care management and care coordination.   Follow Up Plan: The Managed Medicaid care management team will reach out to the patient again over the next 7-14 days.  Telephone outreach scheduled for January 23, 2020 @ [place], BSW, MSW, [place]: [phone]

## 2020-01-09 NOTE — Patient Outreach (Signed)
Care Coordination  01/09/2020  Janet Dixon 05-27-1985 [id number]  A second unsuccessful telephone outreach was attempted today. The patient was referred to the case management team for assistance with care management and care coordination.   Follow Up Plan: The Managed Medicaid care management team will reach out to the patient again over the next 7-14 days.    [name], BSW, MSW, LCSW Social Work Case Freight forwarder - [place]  Direct [place]: [phone]

## 2020-01-23 ENCOUNTER — Other Ambulatory Visit: Payer: Self-pay

## 2020-01-23 NOTE — Patient Outreach (Signed)
Care Coordination  01/23/2020  Janet Dixon 1985-08-03 [id number]  Third unsuccessful telephone outreach was attempted today. The patient was referred to the case management team for assistance with care management and care coordination. The patient's primary care provider has been notified of our unsuccessful attempts to make or maintain contact with the patient. The care management team is pleased to engage with this patient at any time in the future should he/she be interested in assistance from the care management team.   Follow Up Plan: Patient has no PCP on record. LCSW made three unsuccessful phone outreach call attempts. This case is being closed due to inability to establish contact with patient.  [name], BSW, MSW, LCSW Social Work Case Freight forwarder - [place]  Direct [place]: [phone]

## 2020-01-23 NOTE — Patient Instructions (Signed)
Third unsuccessful telephone outreach was attempted today. The patient was referred to the case management team for assistance with care management and care coordination. The patient's primary care provider has been notified of our unsuccessful attempts to make or maintain contact with the patient. The care management team is pleased to engage with this patient at any time in the future should he/she be interested in assistance from the care management team.   Follow Up Plan: LCSW made three unsuccessful outreach phone call attempts. This case is being closed due to inability to establish contact with patient.  [name], BSW, MSW, LCSW Social Work Case Freight forwarder - [place]  Direct [place]: [phone]

## 2020-01-27 ENCOUNTER — Ambulatory Visit: Payer: Medicaid Other

## 2020-02-23 IMAGING — CT CT ABD-PELV W/ CM
2 of 11 series · 11 of 46 positions shown, 18 images · IV contrast (omnipaque)
Comparison: CT of the abdomen pelvis dated 11/08/2018

CLINICAL DATA: 32-year-old female with nausea vomiting.
Pyelonephritis.

EXAM:
CT ABDOMEN AND PELVIS WITH CONTRAST
TECHNIQUE: Multidetector CT imaging of the abdomen and pelvis was performed
using the standard protocol following bolus administration of
intravenous contrast.
CONTRAST:  100mL OMNIPAQUE IOHEXOL 300 MG/ML  SOLN

[Series 3: abd/ pelvis 5.0 i30f 2 · axial · 0.71mm/px · z∈[+675,+1030]mm · 9 of 87 slices shown, 15 images]
[im 8/87  soft-tissue]
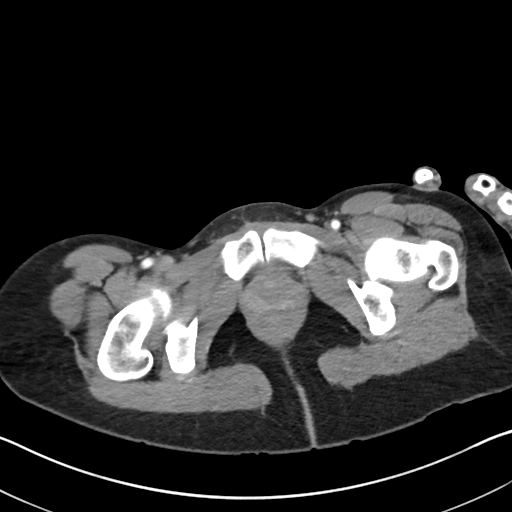
[im 8/87  bone]
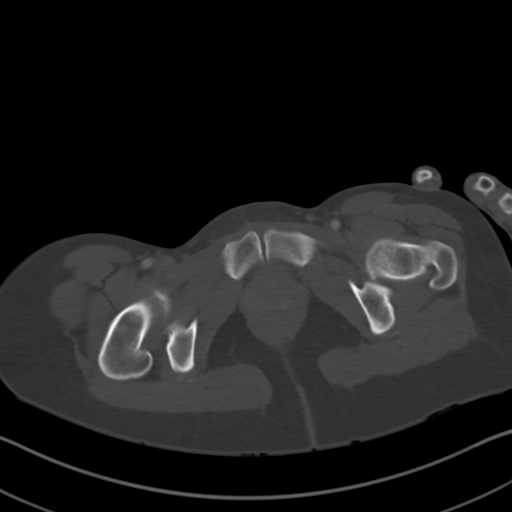
[im 16/87  soft-tissue]
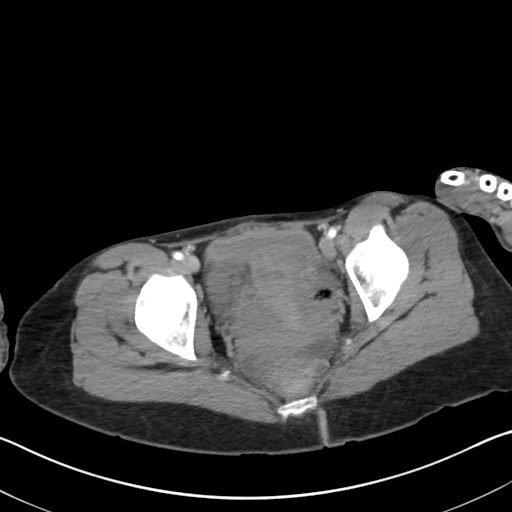
[im 24/87  soft-tissue]
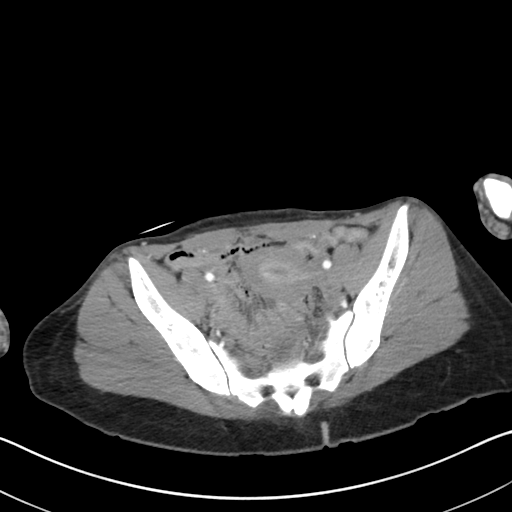
[im 32/87  soft-tissue]
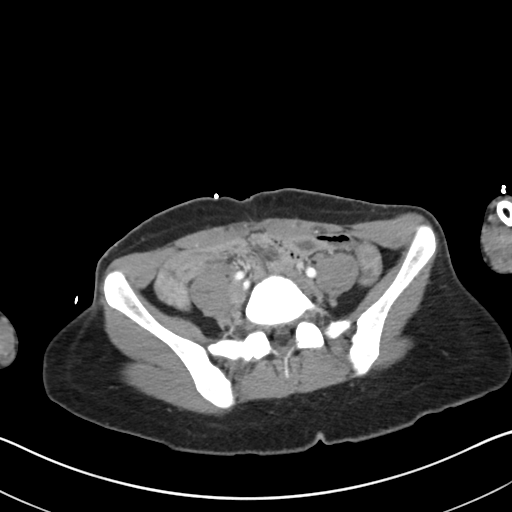
[im 47/87  soft-tissue]
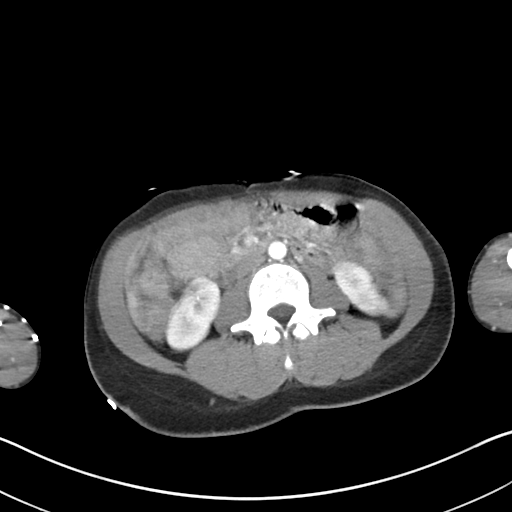
[im 55/87  soft-tissue]
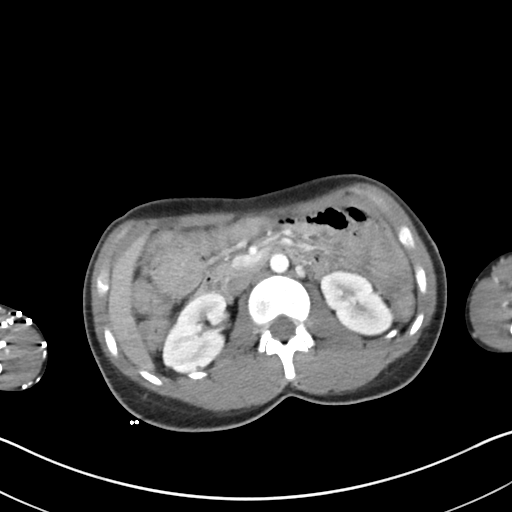
[im 55/87  lung]
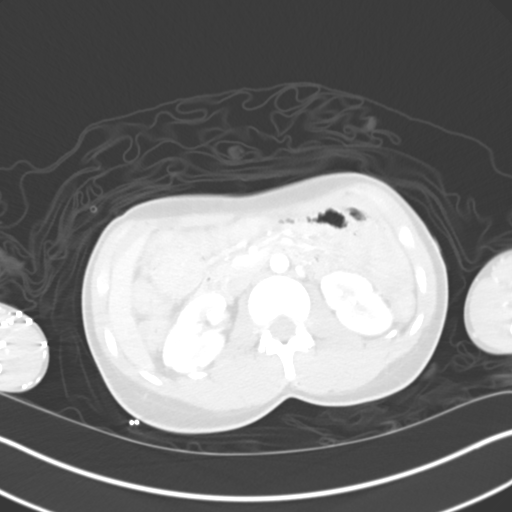
[im 63/87  soft-tissue]
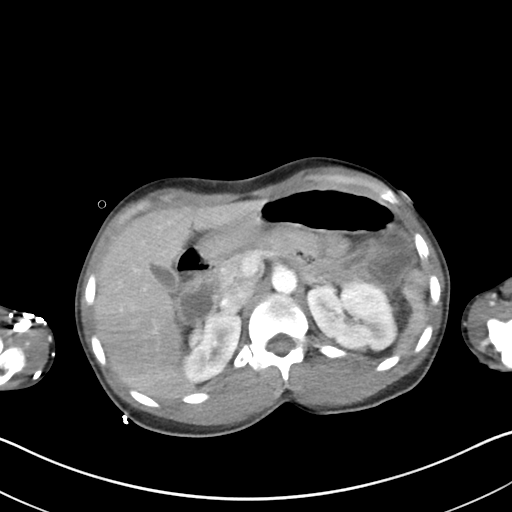
[im 63/87  lung]
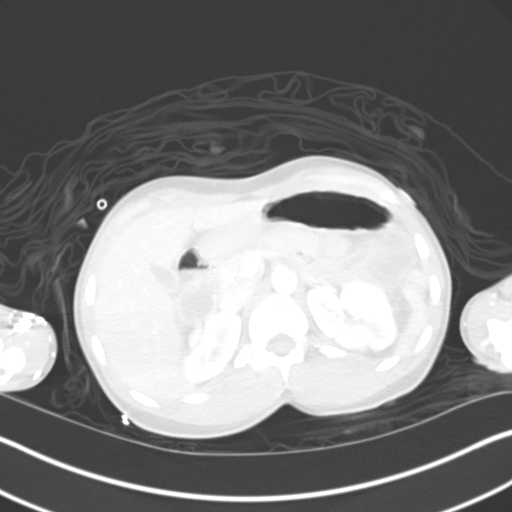
[im 71/87  soft-tissue]
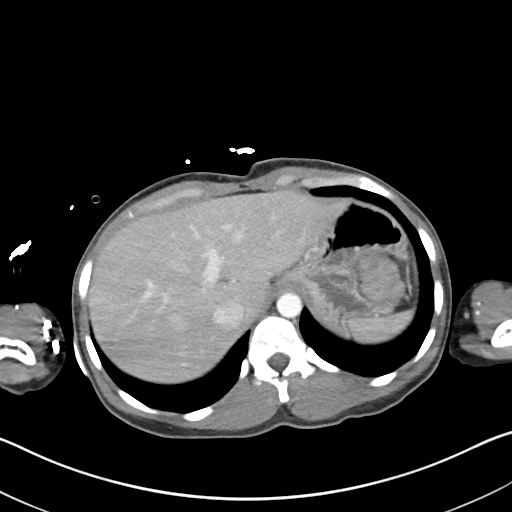
[im 71/87  lung]
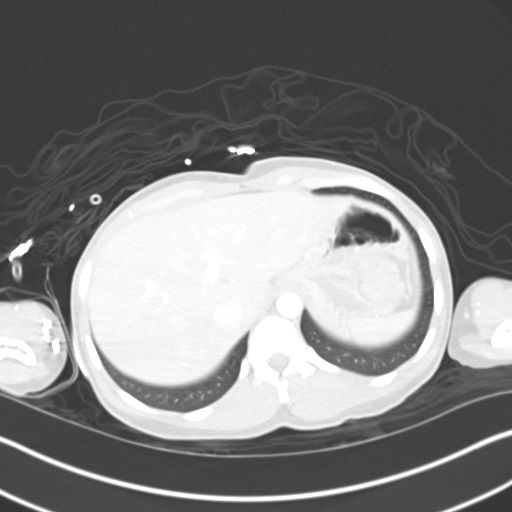
[im 79/87  soft-tissue]
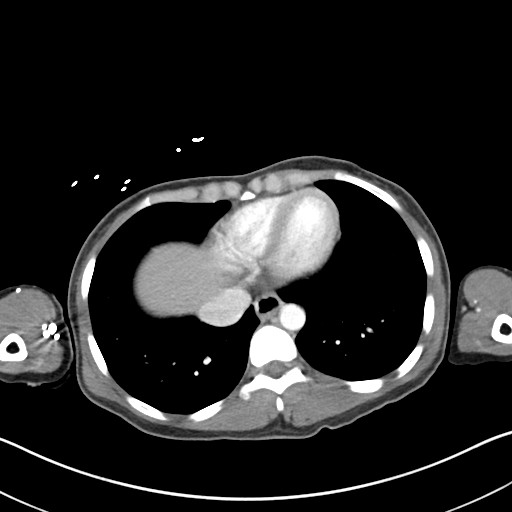
[im 79/87  lung]
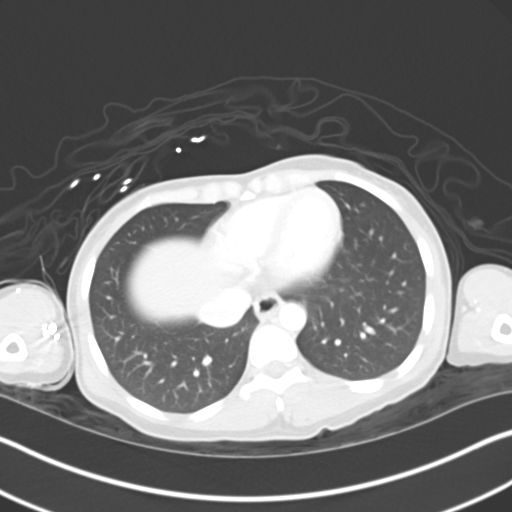
[im 79/87  bone]
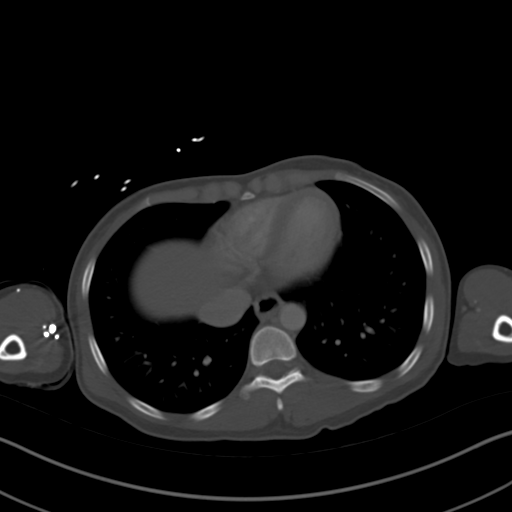

[Series 16: coronal soft tissue · coronal · 0.58mm/px · 2 of 101 slices shown, 3 images]
[im 34/101  soft-tissue]
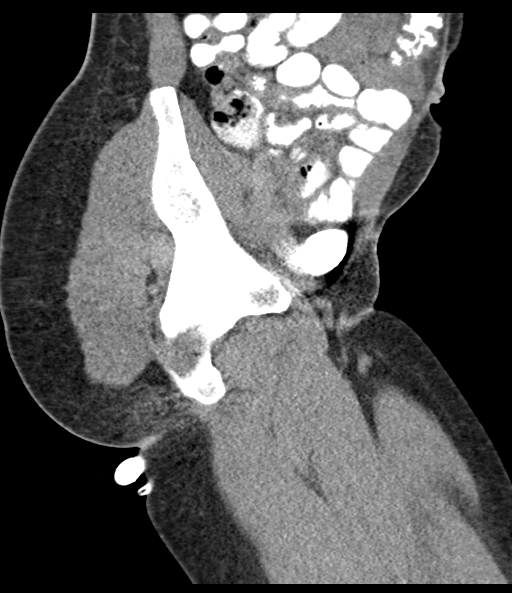
[im 34/101  bone]
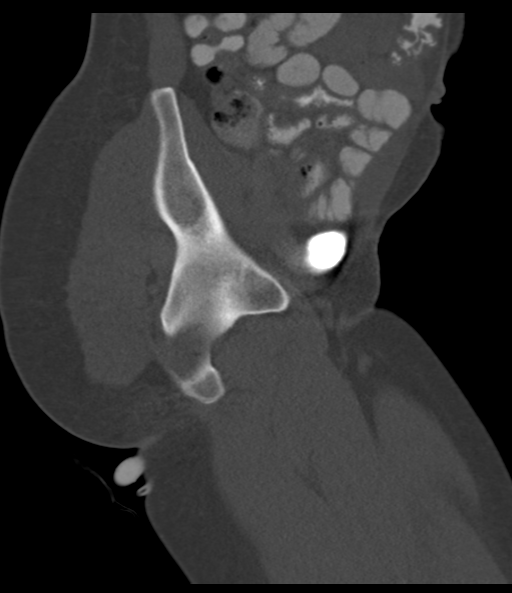
[im 67/101  soft-tissue]
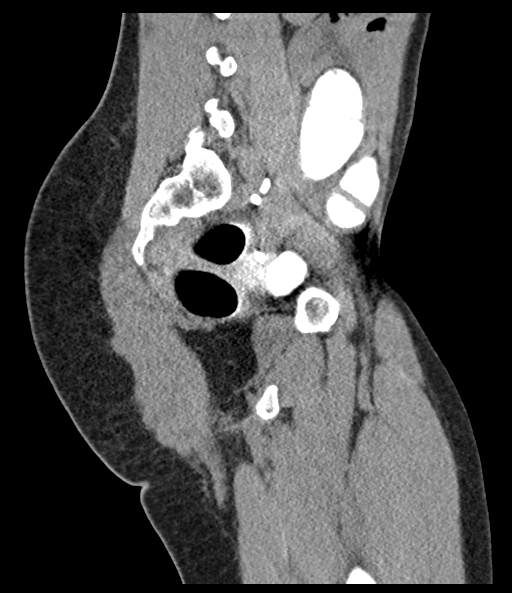

[11 of 46 positions shown; findings below may reference images not displayed]

FINDINGS: Evaluation of this exam is limited due to respiratory motion
artifact.

Lower chest: The visualized lung bases are clear.

No intra-abdominal free air or free fluid.

Hepatobiliary: Probable mild fatty infiltration of the liver. No
intrahepatic biliary ductal dilatation. The gallbladder is
unremarkable.

Pancreas: Unremarkable. No pancreatic ductal dilatation or
surrounding inflammatory changes.

Spleen: Normal in size without focal abnormality.

Adrenals/Urinary Tract: Adrenal glands are unremarkable. Kidneys are
normal, without renal calculi, focal lesion, or hydronephrosis.
Bladder is unremarkable.

Stomach/Bowel: Evaluation the bowel is somewhat limited due to
respiratory motion. There is diffuse thickened appearance of the
colon which may be related to underdistention or represent mild
colitis. However, the colon appears unremarkable after
administration of rectal contrast. Mildly enhancing small-bowel
mucosa may be physiologic. There may be mild thickening of the small
bowel loops there apparent after administration of rectal contrast.
Clinical correlation is recommended to evaluate for enteritis. No
bowel obstruction. The appendix is unremarkable as visualized.

Vascular/Lymphatic: The abdominal aorta and IVC are grossly
unremarkable. No portal venous gas. There is no adenopathy.

Reproductive: The uterus is grossly unremarkable. There is a 2 cm
right adnexal dominant follicle or corpus luteum.

Other: None

Musculoskeletal: No acute or significant osseous findings.
IMPRESSION: 1. Mild thickened appearance of the small bowel may represent
enteritis or less likely mild enterocolitis. Clinical correlation is
recommended. No bowel obstruction. Normal appendix.
2. A 2 cm right adnexal dominant follicle or corpus luteum.

## 2020-04-01 IMAGING — CT CT ABD-PELV W/ CM
2 of 4 series · 16 of 46 positions shown, 18 images · IV contrast (APPLIED)
Comparison: CT abdomen pelvis dated January 09, 2019.

CLINICAL DATA: Acute abdominal pain and vomiting.

EXAM:
CT ABDOMEN AND PELVIS WITH CONTRAST
TECHNIQUE: Multidetector CT imaging of the abdomen and pelvis was performed
using the standard protocol following bolus administration of
intravenous contrast.
CONTRAST:  100mL OMNIPAQUE IOHEXOL 300 MG/ML  SOLN

[Series 3: abdomen 5.0 · axial · 0.61mm/px · z∈[-459,-64]mm · 13 of 89 slices shown, 15 images]
[im 5/89  soft-tissue]
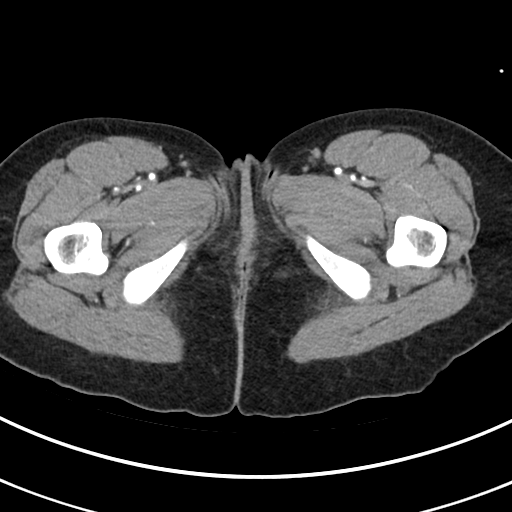
[im 5/89  bone]
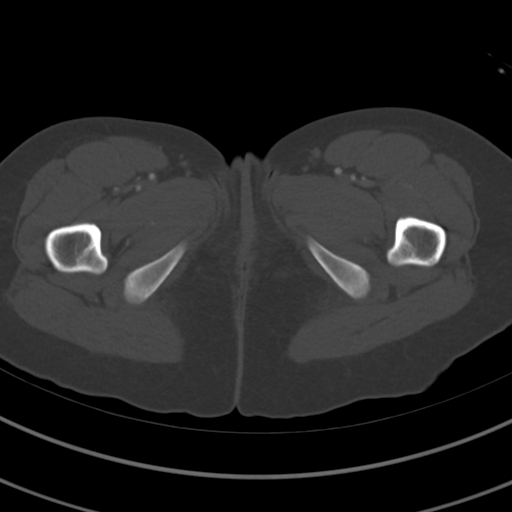
[im 10/89  soft-tissue]
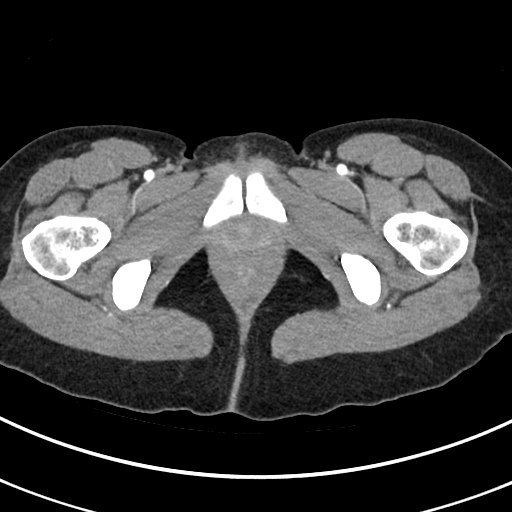
[im 20/89  soft-tissue]
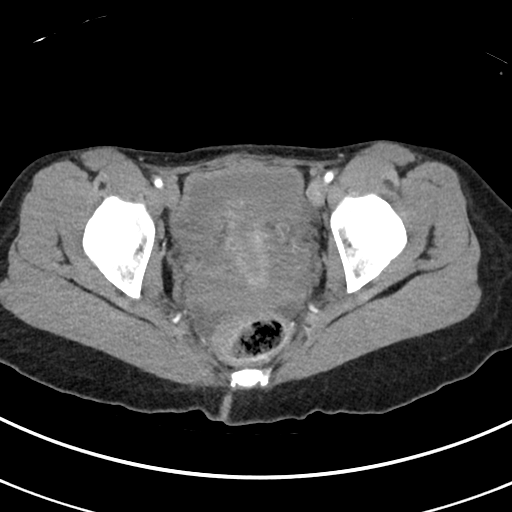
[im 25/89  soft-tissue]
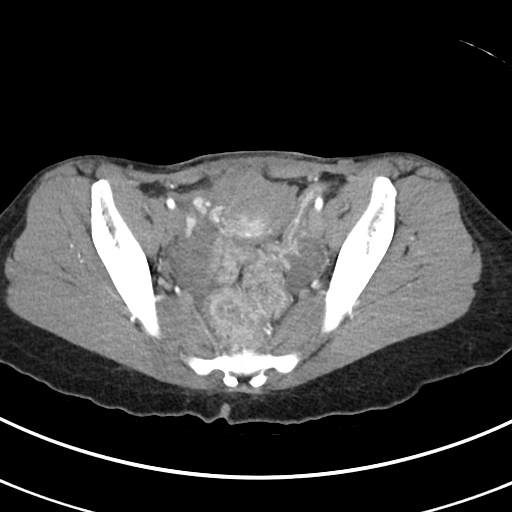
[im 30/89  soft-tissue]
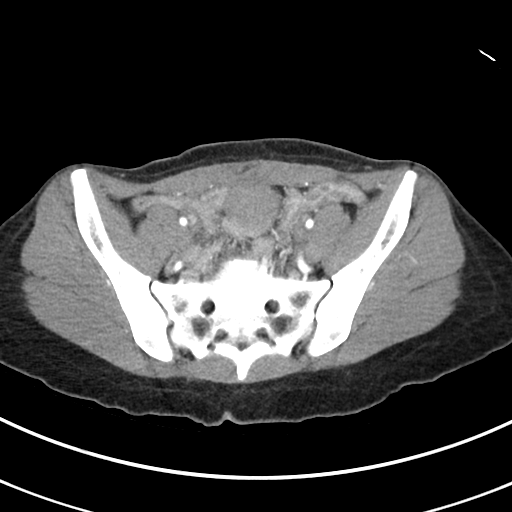
[im 40/89  soft-tissue]
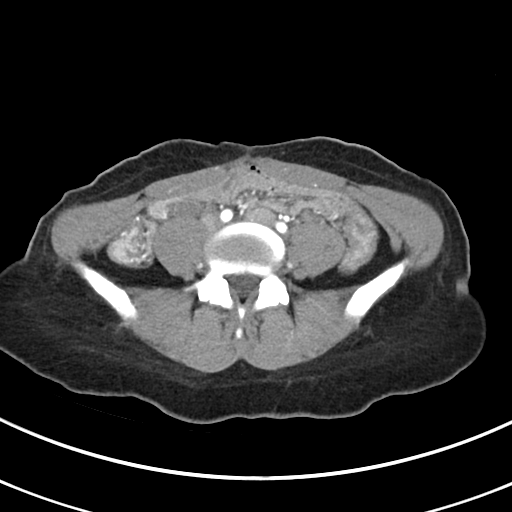
[im 45/89  soft-tissue]
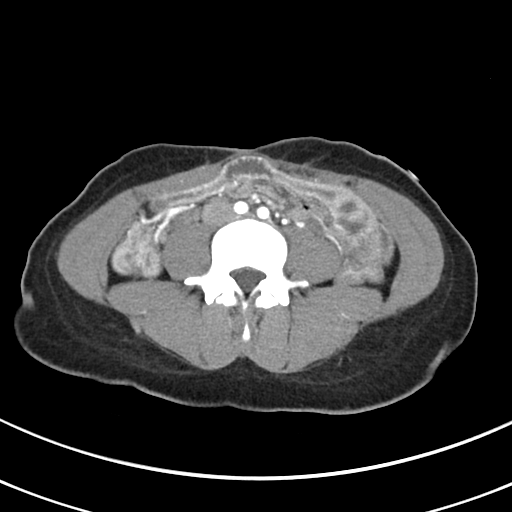
[im 49/89  soft-tissue]
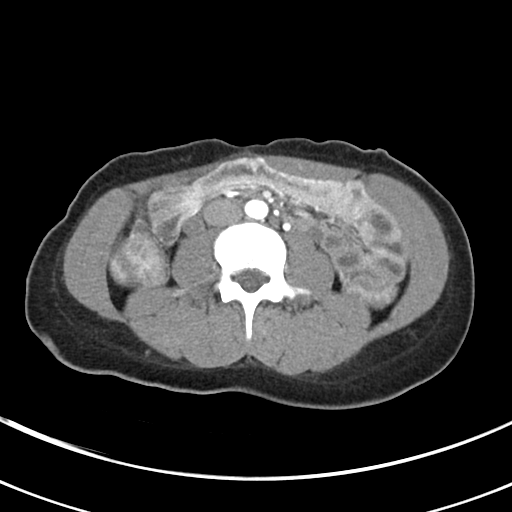
[im 59/89  soft-tissue]
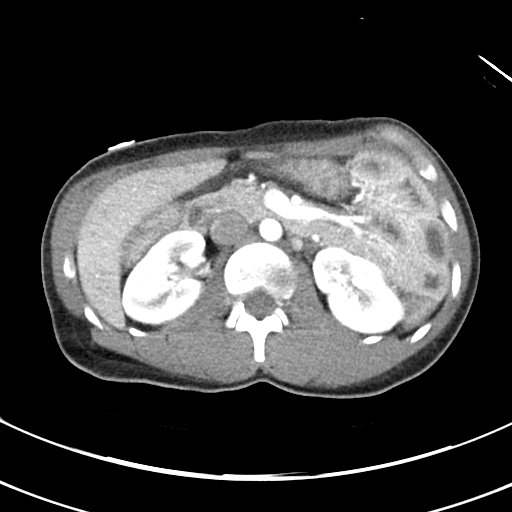
[im 59/89  bone]
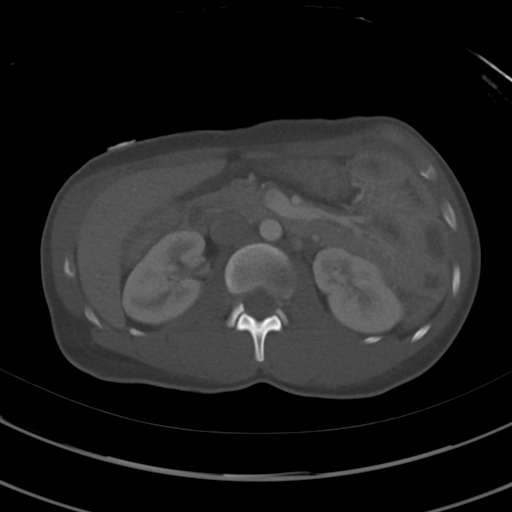
[im 64/89  soft-tissue]
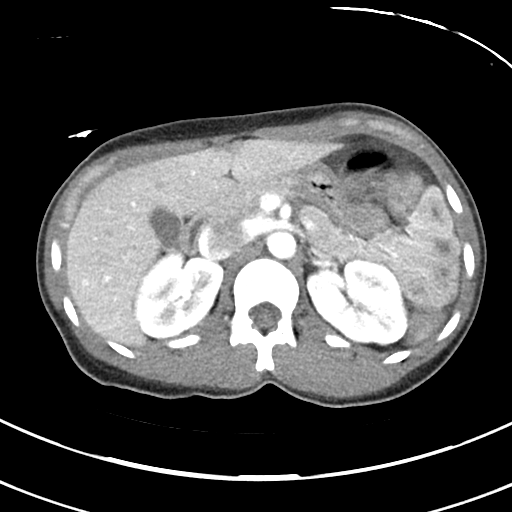
[im 69/89  soft-tissue]
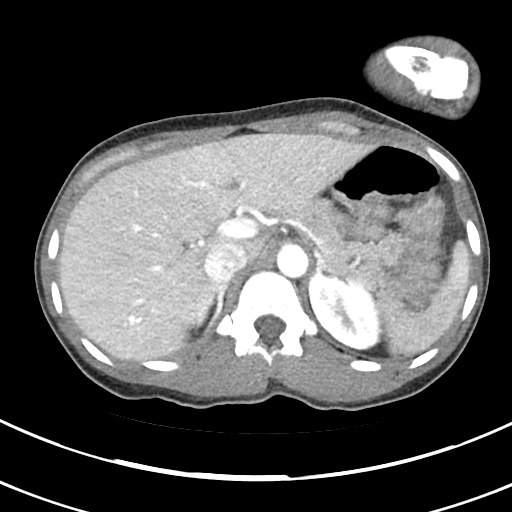
[im 79/89  soft-tissue]
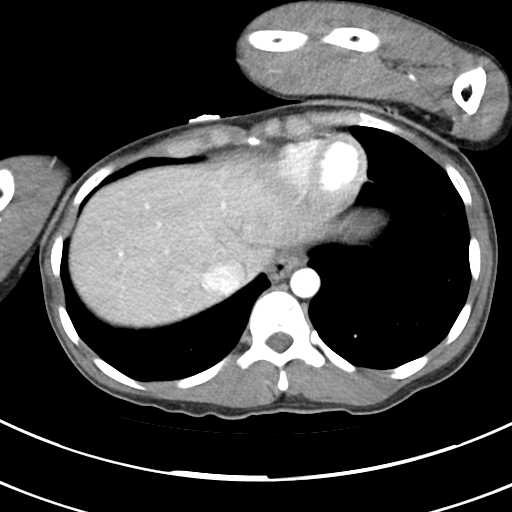
[im 84/89  soft-tissue]
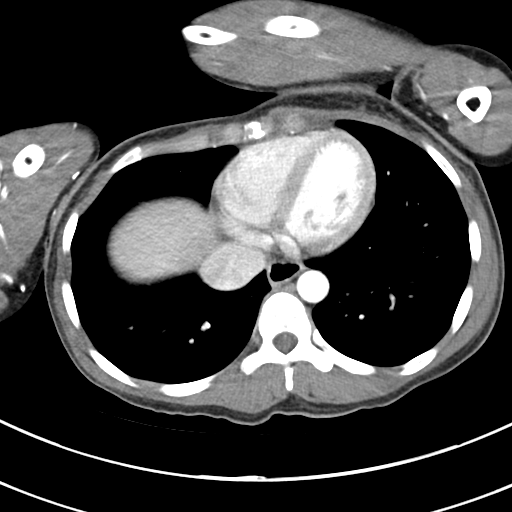

[Series 6: abdomen 3.0 mpr cor · coronal · 0.63mm/px · 3 of 68 slices shown]
[im 23/68  soft-tissue]
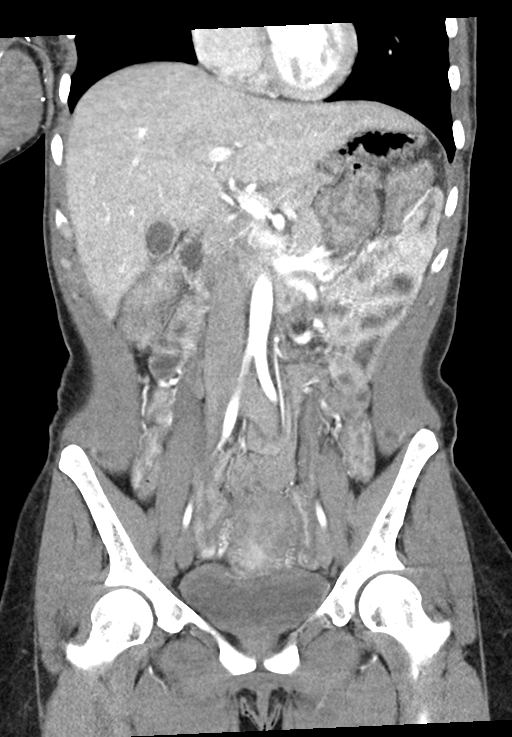
[im 30/68  soft-tissue]
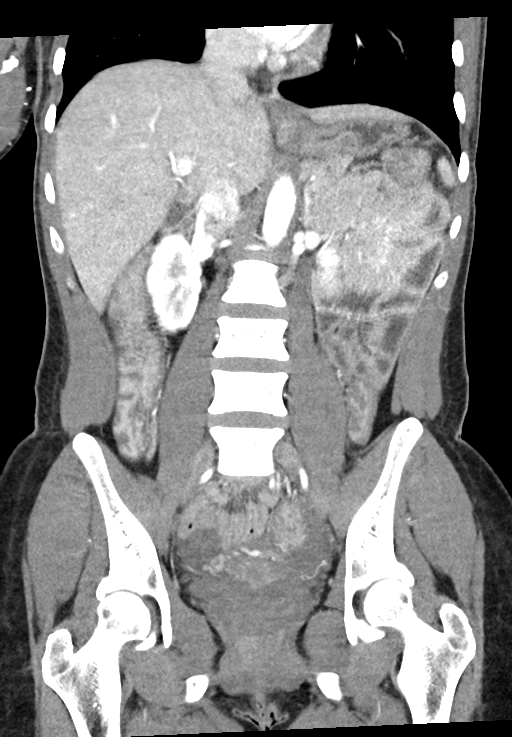
[im 38/68  soft-tissue]
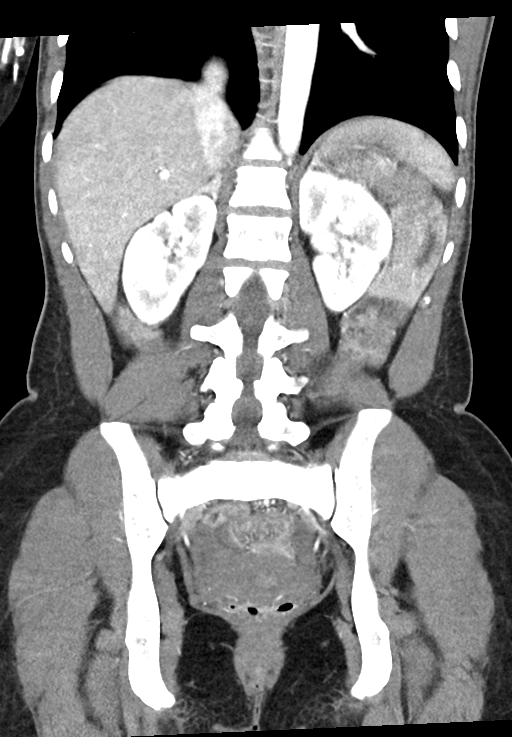

[16 of 46 positions shown; findings below may reference images not displayed]

FINDINGS: Lower chest: No acute abnormality.

Hepatobiliary: No focal liver abnormality is seen. No gallstones,
gallbladder wall thickening, or biliary dilatation.

Pancreas: Unremarkable. No pancreatic ductal dilatation or
surrounding inflammatory changes.

Spleen: Normal in size without focal abnormality.

Adrenals/Urinary Tract: Adrenal glands are unremarkable. Unchanged
mild right renal scarring. The kidneys are otherwise normal, without
renal calculi, focal lesion, or hydronephrosis. Bladder is
unremarkable.

Stomach/Bowel: Stomach is within normal limits. Appendix is not
discretely visualized, but there are no signs of inflammation at the
base of the cecum. Mild wall thickening of the distal sigmoid colon
and rectum. No obstruction.

Vascular/Lymphatic: No significant vascular findings are present. No
enlarged abdominal or pelvic lymph nodes.

Reproductive: Uterus and bilateral adnexa are unremarkable.

Other: Trace free fluid in the pelvis, likely physiologic. No
pneumoperitoneum.

Musculoskeletal: No acute or significant osseous findings.
IMPRESSION: 1. Mild proctocolitis.

## 2020-08-26 ENCOUNTER — Other Ambulatory Visit: Payer: Medicaid Other

## 2020-10-28 ENCOUNTER — Other Ambulatory Visit: Payer: Medicaid Other | Admitting: Adult Health

## 2021-02-20 ENCOUNTER — Ambulatory Visit
Admission: EM | Admit: 2021-02-20 | Discharge: 2021-02-20 | Disposition: A | Discharge status: Home / Self Care | Payer: Medicaid Other | Attending: Urgent Care | Admitting: Urgent Care

## 2021-02-20 ENCOUNTER — Encounter: Payer: Self-pay | Admitting: Emergency Medicine

## 2021-02-20 DIAGNOSIS — Z202 Contact with and (suspected) exposure to infections with a predominantly sexual mode of transmission: Secondary | ICD-10-CM | POA: Diagnosis not present

## 2021-02-20 DIAGNOSIS — R110 Nausea: Secondary | ICD-10-CM | POA: Insufficient documentation

## 2021-02-20 DIAGNOSIS — A549 Gonococcal infection, unspecified: Secondary | ICD-10-CM | POA: Insufficient documentation

## 2021-02-20 DIAGNOSIS — Z7251 High risk heterosexual behavior: Secondary | ICD-10-CM

## 2021-02-20 DIAGNOSIS — R300 Dysuria: Secondary | ICD-10-CM | POA: Diagnosis not present

## 2021-02-20 MED ORDER — CEFTRIAXONE SODIUM 500 MG IJ SOLR
500.0000 mg | Freq: Once | INTRAMUSCULAR | Status: AC
  Administered 2021-02-20: 500 mg via INTRAMUSCULAR

## 2021-02-20 NOTE — ED Notes (Signed)
Pt declined to have pregnany test (urine) completed today stating "I will make an appt with my OB for pregnancy testing".

## 2021-02-20 NOTE — ED Triage Notes (Signed)
Pt presents today for STD testing. She reports sexual partner tested positive for Gonorrhea. She also took home pregnancy test (positive) today. Complains of nausea.

## 2021-02-20 NOTE — ED Provider Notes (Signed)
[facility]   MRN: [id number] DOB: Mar 01, 1986  Subjective:   Janet Dixon is a 35 y.o. female presenting for treatment for gonorrhea.  Patient is sexually active, has unprotected sex with 38 female partner.  Tested positive for gonorrhea and let her know today.  Patient also did a pregnancy test at home and was positive.  She has had some mild nausea without vomiting.  No fever, pelvic pain, vaginal discharge.  Reports that she did have some painful urination here in the clinic.  Does not want to do a urinalysis, pregnancy test with [name].  Prefers to follow-up with her OB/GYN.  No current facility-administered medications for this encounter.  Current Outpatient Medications:    albuterol (PROVENTIL HFA;VENTOLIN HFA) 108 (90 Base) MCG/ACT inhaler, Inhale 2 puffs into the lungs every 6 (six) hours as needed for wheezing or shortness of breath., Disp: 1 Inhaler, Rfl: 1   levETIRAcetam (KEPPRA) 500 MG tablet, Take 1 tablet (500 mg total) by mouth 2 (two) times daily., Disp: 60 tablet, Rfl: 0   metoCLOPramide (REGLAN) 10 MG tablet, Take 1 tablet (10 mg total) by mouth every 8 (eight) hours as needed for nausea or vomiting., Disp: 7 tablet, Rfl: 0   metroNIDAZOLE (FLAGYL) 500 MG tablet, Take 1 tablet (500 mg total) by mouth 2 (two) times daily., Disp: 14 tablet, Rfl: 0   Allergies  Allergen Reactions   Macrobid [Nitrofurantoin Macrocrystal] Shortness Of Breath, Nausea And Vomiting and Other (See Comments)    dizziness   Diphenhydramine Hcl Other (See Comments)    Unknown-patient states that she does take this medication   Ibuprofen Other (See Comments)    Patient states that syncope has occurred after taking this medication   Flexeril [Cyclobenzaprine Hcl] Rash    Past Medical History:  Diagnosis Date   Asthma    History of chlamydia    History of gonorrhea    History of trichomoniasis    Seizures ([place])      Past Surgical History:  Procedure Laterality Date   CESAREAN  SECTION     CESAREAN SECTION N/A 12/02/2014   Procedure: REPEAT CESAREAN SECTION;  Surgeon: Jonnie Kind, MD;  Location: [facility];  Service: Obstetrics;  Laterality: N/A;   CESAREAN SECTION N/A 12/09/2017   Procedure: CESAREAN SECTION;  Surgeon: Jonnie Kind, MD;  Location: [facility];  Service: Obstetrics;  Laterality: N/A;    Family History  Problem Relation Age of Onset   Hypertension Mother    Asthma Brother    Asthma Daughter    Eczema Daughter    Asthma Son    Eczema Son    Aneurysm Maternal Grandmother    Heart disease Maternal Grandmother    Cancer Maternal Grandmother    Asthma Daughter    Eczema Daughter    Asthma Son    Eczema Son    Eczema Son    Cancer Maternal Grandfather        colon   Eczema Maternal Aunt     Social History   Tobacco Use   Smoking status: Every Day    Packs/day: 0.50    Years: 13.00    Pack years: 6.50    Types: Cigarettes   Smokeless tobacco: Never   Tobacco comments:    1-2 per day  Vaping Use   Vaping Use: Never used  Substance Use Topics   Alcohol use: No    Comment: none x 1 month ago   Drug use: Yes  Types: Codeine, IV    ROS   Objective:   Vitals: BP 134/83 (BP Location: Right Arm)   Pulse 92   Temp 98.8 F (37.1 C) (Oral)   Resp 18   LMP 01/25/2021 (Approximate)   SpO2 96%   Physical Exam Constitutional:      General: She is not in acute distress.    Appearance: Normal appearance. She is well-developed. She is not ill-appearing, toxic-appearing or diaphoretic.  HENT:     Head: Normocephalic and atraumatic.     Nose: Nose normal.     Mouth/Throat:     Mouth: Mucous membranes are moist.     Pharynx: Oropharynx is clear.  Eyes:     General: No scleral icterus.    Extraocular Movements: Extraocular movements intact.     Pupils: Pupils are equal, round, and reactive to light.  Cardiovascular:     Rate and Rhythm: Normal rate.  Pulmonary:     Effort: Pulmonary effort is normal.  Abdominal:      General: There is no distension.     Palpations: There is no mass.     Tenderness: There is no abdominal tenderness. There is no right CVA tenderness, left CVA tenderness, guarding or rebound.  Skin:    General: Skin is warm and dry.  Neurological:     General: No focal deficit present.     Mental Status: She is alert and oriented to person, place, and time.  Psychiatric:        Mood and Affect: Mood normal.        Behavior: Behavior normal.        Thought Content: Thought content normal.        Judgment: Judgment normal.     Assessment and Plan :   PDMP not reviewed this encounter.  1. Gonorrhea   2. Exposure to gonorrhea   3. Nausea without vomiting   4. Unprotected sex   5. Dysuria    Empiric treatment given her exposure with IM ceftriaxone for coverage of gonorrhea.  STI testing pending.  Patient refused urinalysis, urine pregnancy test here.  Emphasized need for follow-up with her OB/GYN.  No signs of PID, vital signs hemodynamically stable and therefore will follow general treatment guidelines for gonorrhea.  Counseled patient on potential for adverse effects with medications prescribed/recommended today, ER and return-to-clinic precautions discussed, patient verbalized understanding.    Jaynee Eagles, PA-C 02/20/21 1531

## 2021-02-20 NOTE — Discharge Instructions (Addendum)
You have declined a confirmation pregnancy test here. Please make sure you follow up with your ob/gyn. We will let you know if you need treatment for anything else based off of your lab results. Avoid having any form of sex for at least 2 weeks to give your body a chance to clear the infection.

## 2021-02-22 LAB — CERVICOVAGINAL ANCILLARY ONLY
Bacterial Vaginitis (gardnerella): POSITIVE — AB
Candida Glabrata: NEGATIVE
Candida Vaginitis: POSITIVE — AB
Chlamydia: NEGATIVE
Comment: NEGATIVE
Comment: NEGATIVE
Comment: NEGATIVE
Comment: NEGATIVE
Comment: NEGATIVE
Comment: NORMAL
Neisseria Gonorrhea: POSITIVE — AB
Trichomonas: POSITIVE — AB

## 2021-02-23 ENCOUNTER — Telehealth (HOSPITAL_COMMUNITY): Payer: Self-pay | Admitting: Emergency Medicine

## 2021-02-23 MED ORDER — METRONIDAZOLE 500 MG PO TABS: 500.0000 mg | ORAL_TABLET | Freq: Two times a day (BID) | ORAL | 0 refills | Status: DC

## 2021-02-23 MED ORDER — TERCONAZOLE 0.4 % VA CREA: 1.0000 | TOPICAL_CREAM | Freq: Every day | VAGINAL | 0 refills | Status: AC

## 2021-06-09 ENCOUNTER — Other Ambulatory Visit: Payer: Self-pay

## 2021-06-09 ENCOUNTER — Emergency Department (HOSPITAL_COMMUNITY)
Admission: EM | Admit: 2021-06-09 | Discharge: 2021-06-10 | Disposition: A | Discharge status: Home / Self Care | Payer: Medicaid Other | Attending: Emergency Medicine | Admitting: Emergency Medicine

## 2021-06-09 DIAGNOSIS — R102 Pelvic and perineal pain: Secondary | ICD-10-CM | POA: Diagnosis present

## 2021-06-09 DIAGNOSIS — N941 Unspecified dyspareunia: Secondary | ICD-10-CM

## 2021-06-09 LAB — CBC
HCT: 42.1 % (ref 36.0–46.0)
Hemoglobin: 13.9 g/dL (ref 12.0–15.0)
MCH: 32.1 pg (ref 26.0–34.0)
MCHC: 33 g/dL (ref 30.0–36.0)
MCV: 97.2 fL (ref 80.0–100.0)
Platelets: 359 10*3/uL (ref 150–400)
RBC: 4.33 MIL/uL (ref 3.87–5.11)
RDW: 14.2 % (ref 11.5–15.5)
WBC: 13.8 10*3/uL — ABNORMAL HIGH (ref 4.0–10.5)
nRBC: 0 % (ref 0.0–0.2)

## 2021-06-09 MED ORDER — STERILE WATER FOR INJECTION IJ SOLN
INTRAMUSCULAR | Status: DC
Start: 2021-06-09 — End: 2021-06-10
  Filled 2021-06-09: qty 10

## 2021-06-09 MED ORDER — CEFTRIAXONE SODIUM 500 MG IJ SOLR
500.0000 mg | Freq: Once | INTRAMUSCULAR | Status: AC
  Administered 2021-06-10: 500 mg via INTRAMUSCULAR
  Filled 2021-06-09: qty 500

## 2021-06-09 MED ORDER — DOXYCYCLINE HYCLATE 100 MG PO CAPS: 100.0000 mg | ORAL_CAPSULE | Freq: Two times a day (BID) | ORAL | 0 refills | Status: DC

## 2021-06-09 NOTE — ED Triage Notes (Signed)
Lower abdominal pain constant x 1 hour, denies vaginal discharge, denies urinary symptoms. Last bm today and normal, denies fever. ?

## 2021-06-09 NOTE — ED Provider Notes (Signed)
?[facility] ?Provider Note ? ? ?CSN: [id number] ?Arrival date & time: 06/09/21  2213 ? ?  ? ?History ? ?Chief Complaint  ?Patient presents with  ? Abdominal Pain  ? ? ?Janet Dixon is a 36 y.o. female. ? ?Patient presents to the emergency department for evaluation of lower abdominal and pelvic pain.  Patient reports that the pain began after sexual intercourse approximately 2 hours ago.  She reports that she does occasionally feel this pain with sexual intercourse, but normally it goes away more quickly.  She has not had any vaginal bleeding.  Denies vaginal discharge, urinary symptoms.  No nausea, vomiting or diarrhea. ? ? ?  ? ?Home Medications ?Prior to Admission medications   ?Medication Sig Start Date End Date Taking? Authorizing Provider  ?doxycycline (VIBRAMYCIN) 100 MG capsule Take 1 capsule (100 mg total) by mouth 2 (two) times daily. 06/09/21  Yes Nolia Tschantz, Gwenyth Allegra, MD  ?albuterol (PROVENTIL HFA;VENTOLIN HFA) 108 (90 Base) MCG/ACT inhaler Inhale 2 puffs into the lungs every 6 (six) hours as needed for wheezing or shortness of breath. 08/29/16   Estill Dooms, NP  ?levETIRAcetam (KEPPRA) 500 MG tablet Take 1 tablet (500 mg total) by mouth 2 (two) times daily. 12/20/19   Hayden Rasmussen, MD  ?metoCLOPramide (REGLAN) 10 MG tablet Take 1 tablet (10 mg total) by mouth every 8 (eight) hours as needed for nausea or vomiting. 12/27/19   Rolland Porter, MD  ?metroNIDAZOLE (FLAGYL) 500 MG tablet Take 1 tablet (500 mg total) by mouth 2 (two) times daily. 02/23/21   LampteyMyrene Galas, MD  ?potassium chloride (K-DUR) 10 MEQ tablet Take 1 tablet (10 mEq total) by mouth 2 (two) times daily for 3 days. ?Patient not taking: Reported on 11/08/2018 08/05/18 01/09/19  Harriet Butte, DO  ?   ? ?Allergies    ?Macrobid [nitrofurantoin macrocrystal], Diphenhydramine hcl, Ibuprofen, and Flexeril [cyclobenzaprine hcl]   ? ?Review of Systems   ?Review of Systems  ?Genitourinary:  Positive for pelvic pain.   ? ?Physical Exam ?Updated Vital Signs ?BP (!) 149/109   Pulse 95   Temp 97.9 ?F (36.6 ?C) (Oral)   Resp 20   Wt 67.6 kg   LMP 01/25/2021 (Approximate)   SpO2 97%   BMI 29.10 kg/m?  ?Physical Exam ?Vitals and nursing note reviewed. Exam conducted with a chaperone present.  ?Constitutional:   ?   General: She is not in acute distress. ?   Appearance: She is well-developed.  ?HENT:  ?   Head: Normocephalic and atraumatic.  ?   Mouth/Throat:  ?   Mouth: Mucous membranes are moist.  ?Eyes:  ?   General: Vision grossly intact. Gaze aligned appropriately.  ?   Extraocular Movements: Extraocular movements intact.  ?   Conjunctiva/sclera: Conjunctivae normal.  ?Cardiovascular:  ?   Rate and Rhythm: Normal rate and regular rhythm.  ?   Pulses: Normal pulses.  ?   Heart sounds: Normal heart sounds, S1 normal and S2 normal. No murmur heard. ?  No friction rub. No gallop.  ?Pulmonary:  ?   Effort: Pulmonary effort is normal. No respiratory distress.  ?   Breath sounds: Normal breath sounds.  ?Abdominal:  ?   General: Bowel sounds are normal.  ?   Palpations: Abdomen is soft.  ?   Tenderness: There is no abdominal tenderness. There is no guarding or rebound.  ?   Hernia: No hernia is present. There is no hernia in the left inguinal  area or right inguinal area.  ?Genitourinary: ?   General: Normal vulva.  ?   Pubic Area: No rash.   ?   Labia:     ?   Right: No rash, tenderness, lesion or injury.     ?   Left: No rash, tenderness, lesion or injury.   ?   Vagina: No signs of injury.  ?Musculoskeletal:     ?   General: No swelling.  ?   Cervical back: Full passive range of motion without pain, normal range of motion and neck supple. No spinous process tenderness or muscular tenderness. Normal range of motion.  ?   Right lower leg: No edema.  ?   Left lower leg: No edema.  ?Lymphadenopathy:  ?   Lower Body: No right inguinal adenopathy. No left inguinal adenopathy.  ?Skin: ?   General: Skin is warm and dry.  ?   Capillary  Refill: Capillary refill takes less than 2 seconds.  ?   Findings: No ecchymosis, erythema, rash or wound.  ?Neurological:  ?   General: No focal deficit present.  ?   Mental Status: She is alert and oriented to person, place, and time.  ?   GCS: GCS eye subscore is 4. GCS verbal subscore is 5. GCS motor subscore is 6.  ?   Cranial Nerves: Cranial nerves 2-12 are intact.  ?   Sensory: Sensation is intact.  ?   Motor: Motor function is intact.  ?   Coordination: Coordination is intact.  ?Psychiatric:     ?   Attention and Perception: Attention normal.     ?   Mood and Affect: Mood normal.     ?   Speech: Speech normal.     ?   Behavior: Behavior normal.  ? ? ?ED Results / Procedures / Treatments   ?Labs ?(all labs ordered are listed, but only abnormal results are displayed) ?Labs Reviewed  ?CBC - Abnormal; Notable for the following components:  ?    Result Value  ? WBC 13.8 (*)   ? All other components within normal limits  ?WET PREP, GENITAL  ?URINALYSIS, ROUTINE W REFLEX MICROSCOPIC  ?POC URINE PREG, ED  ?GC/CHLAMYDIA PROBE AMP ([place]) NOT AT [facility]  ? ? ?EKG ?None ? ?Radiology ?No results found. ? ?Procedures ?Procedures  ? ? ?Medications Ordered in ED ?Medications  ?cefTRIAXone (ROCEPHIN) injection 500 mg (has no administration in time range)  ? ? ?ED Course/ Medical Decision Making/ A&P ?  ?                        ?Medical Decision Making ?Amount and/or Complexity of Data Reviewed ?Labs: ordered. ? ? ?Patient presents to the emergency department for evaluation of pelvic pain after sex.  Patient reports that she has had this pain after sex in the past but it was worse tonight.  She has not had any bleeding.  Abdominal exam is benign, area of pain is in the left lower pelvic area.  Pelvic exam was difficult.  She barely tolerated the speculum.  I had difficulty even inserting a finger for bimanual examination.  She did seem to have significant cervical motion tenderness.  We will treat empirically.  Follow-up  with OB/GYN for recheck.  Given return precautions. ? ? ? ? ? ? ? ?Final Clinical Impression(s) / ED Diagnoses ?Final diagnoses:  ?Dyspareunia in female  ? ? ?Rx / DC Orders ?ED Discharge Orders   ? ?  Ordered  ?  doxycycline (VIBRAMYCIN) 100 MG capsule  2 times daily       ? 06/09/21 2355  ? ?  ?  ? ?  ? ? ?  ?Orpah Greek, MD ?06/09/21 2356 ? ?

## 2021-06-10 LAB — WET PREP, GENITAL
Sperm: NONE SEEN
Trich, Wet Prep: NONE SEEN
WBC, Wet Prep HPF POC: <10 (ref <10)
Yeast Wet Prep HPF POC: NONE SEEN

## 2021-06-10 LAB — URINALYSIS, ROUTINE W REFLEX MICROSCOPIC
Bilirubin Urine: NEGATIVE
Glucose, UA: NEGATIVE mg/dL
Hgb urine dipstick: NEGATIVE
Ketones, ur: NEGATIVE mg/dL
Nitrite: POSITIVE — AB
Protein, ur: NEGATIVE mg/dL
Specific Gravity, Urine: 1.012 (ref 1.005–1.030)
pH: 7 (ref 5.0–8.0)

## 2021-06-10 LAB — POC URINE PREG, ED: Preg Test, Ur: NEGATIVE

## 2021-06-10 MED ORDER — HYDROCODONE-ACETAMINOPHEN 5-325 MG PO TABS: 2.0000 | ORAL_TABLET | ORAL | 0 refills | Status: DC | PRN

## 2021-06-10 MED ORDER — HYDROCODONE-ACETAMINOPHEN 5-325 MG PO TABS
1.0000 | ORAL_TABLET | Freq: Once | ORAL | Status: AC
  Administered 2021-06-10: 1 via ORAL
  Filled 2021-06-10: qty 1

## 2021-06-11 ENCOUNTER — Telehealth: Payer: Self-pay

## 2021-06-11 LAB — GC/CHLAMYDIA PROBE AMP (~~LOC~~) NOT AT ARMC
Chlamydia: POSITIVE — AB
Comment: NEGATIVE
Comment: NORMAL
Neisseria Gonorrhea: NEGATIVE

## 2021-06-11 MED FILL — Hydrocodone-Acetaminophen Tab 5-325 MG: ORAL | Qty: 6 | Status: AC

## 2021-06-11 NOTE — Telephone Encounter (Signed)
Transition Care Management Follow-up Telephone Call ?Date of discharge and from where: 06/10/2021 from [facility] ?How have you been since you were released from the hospital? Patient stated that she is feeling okay today.  ?Any questions or concerns? No ? ?Items Reviewed: ?Did the pt receive and understand the discharge instructions provided? Yes  ?Medications obtained and verified? Yes Patient stated that she has only 1 rx filled and that was for the doxycycline and does not have the rx for the hydrocodone.  ?Other? No  ?Any new allergies since your discharge? No  ?Dietary orders reviewed? No ?Do you have support at home? Yes  ? ?Functional Questionnaire: (I = Independent and D = Dependent) ?ADLs: I ?Bathing/Dressing- I ?Meal Prep- I ?Eating- I ?Maintaining continence- I ?Transferring/Ambulation- I ?Managing Meds- I ? ? ?Follow up appointments reviewed: ? ?PCP Hospital f/u appt confirmed? No   ?Specialist Hospital f/u appt confirmed? No  Patient calling OBGYN for an appt.  ?Are transportation arrangements needed? Yes  ?If their condition worsens, is the pt aware to call PCP or go to the Emergency Dept.? Yes ?Was the patient provided with contact information for the PCP's office or ED? Yes ?Was to pt encouraged to call back with questions or concerns? Yes ? ?

## 2021-06-14 ENCOUNTER — Telehealth: Payer: Self-pay | Admitting: Obstetrics & Gynecology

## 2021-06-14 ENCOUNTER — Other Ambulatory Visit: Payer: Self-pay | Admitting: Obstetrics & Gynecology

## 2021-06-14 MED ORDER — DOXYCYCLINE HYCLATE 100 MG PO TABS: 100.0000 mg | ORAL_TABLET | Freq: Two times a day (BID) | ORAL | 0 refills | Status: DC

## 2021-06-14 MED ORDER — HYDROCODONE-ACETAMINOPHEN 5-325 MG PO TABS: 2.0000 | ORAL_TABLET | ORAL | 0 refills | Status: DC | PRN

## 2021-06-14 NOTE — Telephone Encounter (Signed)
Left message @ 3:17 pm. [phone] & [phone]. JSY ?

## 2021-06-14 NOTE — Telephone Encounter (Signed)
Patient went to [facility] on 3/1. Her discharge instructions told her to f/u with her ob/gyn. She has an appointment this Thursday with you. She states that they prescribed her two medicines in the hospital, but never sent them to a pharmacy and she states that she didn't get paper printout of prescriptions to take to pharmacy. She is wanting to get them called in. It was the doxycyline and the hydrocodone-acetaminophen. Please advise.  ?

## 2021-06-14 NOTE — Telephone Encounter (Signed)
There is no need to see anyone Thursday ? ?Take the meds and follow up in 2 weeks, please reschedule her appoitment for then, Thursday will not be of benefit at all ? ?Also do not have sex until after she has completed therapy and had her follow up test of cure

## 2021-06-15 ENCOUNTER — Telehealth: Payer: Self-pay | Admitting: Obstetrics & Gynecology

## 2021-06-15 NOTE — Telephone Encounter (Signed)
Patient lives in [place] now and would like her prescriptions sent to [facility]. ?

## 2021-06-15 NOTE — Telephone Encounter (Signed)
Left message @ 9:09 am. JSY ?

## 2021-06-15 NOTE — Telephone Encounter (Signed)
Spoke with pt letting her know she don't need to be seen on Thursday. Take med and follow up in 2 weeks per Dr. Elonda Husky. Appt has been cancelled and rescheduled. Advised no sex until she has completed therapy and had follow up visit. Pt can call and have prescriptions transferred to [facility]. Pt voiced understanding. JSY ?

## 2021-06-15 NOTE — Telephone Encounter (Signed)
Spoke with pt. Janet Dixon ?

## 2021-06-15 NOTE — Telephone Encounter (Signed)
Left message @ 10:06 am. JSY ?

## 2021-06-17 ENCOUNTER — Ambulatory Visit: Payer: Medicaid Other | Admitting: Obstetrics & Gynecology

## 2021-06-17 ENCOUNTER — Telehealth: Payer: Self-pay | Admitting: *Deleted

## 2021-06-17 ENCOUNTER — Other Ambulatory Visit: Payer: Self-pay | Admitting: Obstetrics & Gynecology

## 2021-06-17 MED ORDER — HYDROCODONE-ACETAMINOPHEN 5-325 MG PO TABS
2.0000 | ORAL_TABLET | ORAL | 0 refills | Status: DC | PRN
Start: 2021-06-17 — End: 2022-08-18

## 2021-06-17 MED ORDER — DOXYCYCLINE HYCLATE 100 MG PO TABS
100.0000 mg | ORAL_TABLET | Freq: Two times a day (BID) | ORAL | 0 refills | Status: DC
Start: 2021-06-17 — End: 2021-10-26

## 2021-06-17 NOTE — Telephone Encounter (Signed)
Pt was prescribed Doxy and Hydrocodone. It was sent to a pharmacy in [place]. Pt didn't pick up prescriptions in [place]. Can you send prescriptions to [place]? Please advise. Thanks!! JSY ?

## 2021-07-01 ENCOUNTER — Ambulatory Visit: Payer: Medicaid Other | Admitting: Obstetrics & Gynecology

## 2021-10-24 ENCOUNTER — Ambulatory Visit
Admission: EM | Admit: 2021-10-24 | Discharge: 2021-10-24 | Disposition: A | Discharge status: Home / Self Care | Payer: Medicaid Other | Attending: Family Medicine | Admitting: Family Medicine

## 2021-10-24 ENCOUNTER — Encounter: Payer: Self-pay | Admitting: Emergency Medicine

## 2021-10-24 DIAGNOSIS — R102 Pelvic and perineal pain: Secondary | ICD-10-CM | POA: Diagnosis not present

## 2021-10-24 DIAGNOSIS — N390 Urinary tract infection, site not specified: Secondary | ICD-10-CM | POA: Diagnosis not present

## 2021-10-24 DIAGNOSIS — N898 Other specified noninflammatory disorders of vagina: Secondary | ICD-10-CM | POA: Diagnosis not present

## 2021-10-24 LAB — POCT URINE PREGNANCY: Preg Test, Ur: NEGATIVE

## 2021-10-24 LAB — POCT URINALYSIS DIP (MANUAL ENTRY)
Bilirubin, UA: NEGATIVE
Glucose, UA: NEGATIVE mg/dL
Ketones, POC UA: NEGATIVE mg/dL
Nitrite, UA: POSITIVE — AB
Protein Ur, POC: 30 mg/dL — AB
Spec Grav, UA: >=1.03 — AB (ref 1.010–1.025)
Urobilinogen, UA: 1 E.U./dL
pH, UA: 5.5 (ref 5.0–8.0)

## 2021-10-24 MED ORDER — CEPHALEXIN 500 MG PO CAPS: 500.0000 mg | ORAL_CAPSULE | Freq: Two times a day (BID) | ORAL | 0 refills | Status: DC

## 2021-10-24 NOTE — ED Triage Notes (Signed)
Patient c/o vaginal odor x 2 weeks, no vaginal discharge.  Patient has had a multiple sex partners recently.

## 2021-10-24 NOTE — ED Provider Notes (Signed)
[facility]    CSN: [id number] Arrival date & time: 10/24/21  1128      History   Chief Complaint Chief Complaint  Patient presents with   Vaginal Odor    HPI Janet Dixon is a 36 y.o. female.   Patient presenting today with 1 to 2-week history of strong vaginal odor.  She denies vaginal discharge, irregular vaginal bleeding, pelvic or abdominal pain, rashes, lesions.  She has not tried anything over-the-counter for symptoms.  Has had multiple sexual partners recently, sometimes uses condoms but not all the time.  She denies any history of similar symptoms.  LMP per patient 09/15/2021.    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   Intractable nausea and vomiting 02/16/2019   Abdominal pain    Urinary tract infection without hematuria    Intractable vomiting    Pyelonephritis 08/04/2018   Status post repeat low transverse cesarean section 12/09/2017   Drug use affecting pregnancy 06/08/2017   Elevated lipase 04/14/2017   Stomach pain 04/14/2017   History of C-section 04/14/2017   Hematuria 12/07/2016   History of chlamydia 12/07/2016   Papanicolaou smear of cervix with positive high risk human papilloma virus (HPV) test 09/07/2016   Trichimoniasis 08/29/2016   Seizure disorder ([place]) 05/01/2014   Cocaine abuse ([place]) 03/14/2014    Past Surgical History:  Procedure Laterality Date   CESAREAN SECTION     CESAREAN SECTION N/A 12/02/2014   Procedure: REPEAT CESAREAN SECTION;  Surgeon: Jonnie Kind, MD;  Location: [facility];  Service: Obstetrics;  Laterality: N/A;   CESAREAN SECTION N/A 12/09/2017   Procedure:  CESAREAN SECTION;  Surgeon: Jonnie Kind, MD;  Location: [facility];  Service: Obstetrics;  Laterality: N/A;    OB History     Gravida  9   Para  7   Term  7   Preterm      AB  0   Living  7      SAB  0   IAB      Ectopic      Multiple  0   Live Births  7            Home Medications    Prior to Admission medications   Medication Sig Start Date End Date Taking? Authorizing Provider  cephALEXin (KEFLEX) 500 MG capsule Take 1 capsule (500 mg total) by mouth 2 (two) times daily. 10/24/21  Yes Volney American, PA-C  albuterol (PROVENTIL HFA;VENTOLIN HFA) 108 (90 Base) MCG/ACT inhaler Inhale 2 puffs into the lungs every 6 (six) hours as needed for wheezing or shortness of breath. 08/29/16   Estill Dooms, NP  doxycycline (VIBRA-TABS) 100 MG tablet Take 1 tablet (100 mg total) by mouth 2 (two) times daily. 06/17/21   Florian Buff, MD  doxycycline (VIBRAMYCIN) 100 MG capsule Take 1 capsule (100 mg total) by mouth 2 (two) times daily. 06/09/21   Orpah Greek, MD  HYDROcodone-acetaminophen (NORCO/VICODIN) 5-325 MG tablet Take 2 tablets by mouth every 4 (four) hours as needed. 06/17/21   Florian Buff, MD  levETIRAcetam (KEPPRA) 500 MG tablet Take 1 tablet (500 mg total) by mouth 2 (two) times daily. 12/20/19   Hayden Rasmussen, MD  metoCLOPramide (REGLAN) 10 MG tablet Take 1 tablet (10 mg total) by mouth every 8 (eight) hours as needed for nausea or vomiting. 12/27/19   Rolland Porter, MD  metroNIDAZOLE (FLAGYL) 500 MG tablet Take 1 tablet (500 mg total) by mouth 2 (two) times daily. 02/23/21   Lamptey, Myrene Galas, MD  potassium chloride (K-DUR) 10 MEQ tablet Take 1 tablet (10 mEq total) by mouth 2 (two) times daily for 3 days. Patient not taking: Reported on 11/08/2018 08/05/18 01/09/19  Harriet Butte, DO    Family History Family History  Problem Relation Age of Onset   Hypertension Mother    Asthma Brother    Asthma Daughter    Eczema Daughter     Asthma Son    Eczema Son    Aneurysm Maternal Grandmother    Heart disease Maternal Grandmother    Cancer Maternal Grandmother    Asthma Daughter    Eczema Daughter    Asthma Son    Eczema Son    Eczema Son    Cancer Maternal Grandfather        colon   Eczema Maternal Aunt     Social History Social History   Tobacco Use   Smoking status: Every Day    Packs/day: 0.50    Years: 13.00    Total pack years: 6.50    Types: Cigarettes   Smokeless tobacco: Never   Tobacco comments:    1-2 per day  Vaping Use   Vaping Use: Never used  Substance Use Topics   Alcohol use: No    Comment: none x 1 month ago   Drug use: Yes    Types: Codeine, IV     Allergies   Macrobid [nitrofurantoin macrocrystal], Diphenhydramine hcl, Ibuprofen, and Flexeril [cyclobenzaprine hcl]   Review of Systems Review of Systems Per HPI  Physical Exam Triage Vital Signs ED Triage Vitals  Enc Vitals Group     BP 10/24/21 1139 (!) 138/100     Pulse Rate 10/24/21 1139 (!) 103     Resp 10/24/21 1139 18     Temp 10/24/21 1139 98.9 F (37.2 C)     Temp Source 10/24/21 1139 Oral     SpO2 10/24/21 1139 96 %     Weight 10/24/21 1142 150 lb (68 kg)     Height 10/24/21 1142 5' (1.524 m)     Head Circumference --      Peak Flow --      Pain Score 10/24/21 1142 0     Pain Loc --      Pain Edu? --      Excl. in [place]? --    No data found.  Updated Vital Signs BP (!) 138/100 (BP Location: Right Arm)   Pulse (!) 103   Temp 98.9 F (37.2 C) (Oral)   Resp 18   Ht 5' (1.524 m)   Wt 150 lb (68 kg)   LMP 09/15/2021 (Approximate)   SpO2 96%   Breastfeeding No   BMI 29.29 kg/m   Visual Acuity Right Eye Distance:   Left Eye Distance:   Bilateral Distance:    Right Eye Near:   Left Eye Near:    Bilateral Near:     Physical Exam Vitals and nursing note reviewed. Exam conducted with a chaperone present.  Constitutional:      Appearance:  Normal appearance. She is not ill-appearing.  HENT:      Head: Atraumatic.     Mouth/Throat:     Mouth: Mucous membranes are moist.  Eyes:     Extraocular Movements: Extraocular movements intact.     Conjunctiva/sclera: Conjunctivae normal.  Cardiovascular:     Rate and Rhythm: Normal rate and regular rhythm.     Heart sounds: Normal heart sounds.  Pulmonary:     Effort: Pulmonary effort is normal. No respiratory distress.  Genitourinary:    Comments: Small nodular pink protrusion from introitus present, unable to palpate or further examine as patient slapped hand away and also declines speculum exam.  No obvious external rashes, lesions, discharge Musculoskeletal:        General: Normal range of motion.     Cervical back: Normal range of motion and neck supple.  Skin:    General: Skin is warm and dry.  Neurological:     Mental Status: She is alert and oriented to person, place, and time.  Psychiatric:     Comments: Appears very anxious, hyperactive, tearful at times, sentences at times and pulled      UC Treatments / Results  Labs (all labs ordered are listed, but only abnormal results are displayed) Labs Reviewed  POCT URINALYSIS DIP (MANUAL ENTRY) - Abnormal; Notable for the following components:      Result Value   Spec Grav, UA >=1.030 (*)    Blood, UA trace-intact (*)    Protein Ur, POC =30 (*)    Nitrite, UA Positive (*)    Leukocytes, UA Trace (*)    All other components within normal limits  URINE CULTURE  POCT URINE PREGNANCY  CERVICOVAGINAL ANCILLARY ONLY    EKG   Radiology No results found.  Procedures Procedures (including critical care time)  Medications Ordered in UC Medications - No data to display  Initial Impression / Assessment and Plan / UC Course  I have reviewed the triage vital signs and the nursing notes.  Pertinent labs & imaging results that were available during my care of the patient were reviewed by me and considered in my medical decision making (see chart for details).      Unfortunately patient has been intolerant to pelvic exam today so unable to further evaluate what may be going on, possibly a small prolapse type issue.  Her urinalysis today is showing a possible urinary tract infection, urine culture pending, treat with Keflex while awaiting remainder of results.  Vaginal swab also pending.  Follow-up with OB/GYN for further evaluation as soon as possible.  ED for worsening symptoms at any time.  Final Clinical Impressions(s) / UC Diagnoses   Final diagnoses:  Vaginal pain  Vaginal odor  Acute lower UTI     Discharge Instructions      Follow up with your OB/GYN first thing Monday morning    ED Prescriptions     Medication Sig Dispense Auth. Provider   cephALEXin (KEFLEX) 500 MG capsule Take 1 capsule (500 mg total) by mouth 2 (two) times daily. 10 capsule Volney American, [place]      PDMP not reviewed this encounter.   Volney American, [place] 10/24/21 1356

## 2021-10-24 NOTE — Discharge Instructions (Signed)
Follow up with your OB/GYN first thing Monday morning

## 2021-10-25 LAB — CERVICOVAGINAL ANCILLARY ONLY
Bacterial Vaginitis (gardnerella): POSITIVE — AB
Candida Glabrata: NEGATIVE
Candida Vaginitis: POSITIVE — AB
Chlamydia: POSITIVE — AB
Comment: NEGATIVE
Comment: NEGATIVE
Comment: NEGATIVE
Comment: NEGATIVE
Comment: NEGATIVE
Comment: NORMAL
Neisseria Gonorrhea: NEGATIVE
Trichomonas: POSITIVE — AB

## 2021-10-26 ENCOUNTER — Telehealth (HOSPITAL_COMMUNITY): Payer: Self-pay | Admitting: Emergency Medicine

## 2021-10-26 LAB — URINE CULTURE: Culture: >=100000 — AB

## 2021-10-26 MED ORDER — METRONIDAZOLE 500 MG PO TABS: 500.0000 mg | ORAL_TABLET | Freq: Two times a day (BID) | ORAL | 0 refills | Status: DC

## 2021-10-26 MED ORDER — DOXYCYCLINE HYCLATE 100 MG PO CAPS: 100.0000 mg | ORAL_CAPSULE | Freq: Two times a day (BID) | ORAL | 0 refills | Status: AC

## 2021-10-26 MED ORDER — FLUCONAZOLE 150 MG PO TABS: 150.0000 mg | ORAL_TABLET | Freq: Once | ORAL | 0 refills | Status: AC

## 2021-10-28 ENCOUNTER — Telehealth (HOSPITAL_COMMUNITY): Payer: Self-pay | Admitting: Emergency Medicine

## 2021-10-28 NOTE — Telephone Encounter (Signed)
Patient returned call about recent results after third attempt Contacted patient by phone.  Verified identity using two identifiers.  Provided positive result.  Reviewed safe sex practices, notifying partners, and refraining from sexual activities for 7 days from time of treatment.  Patient verified understanding, all questions answered.

## 2021-11-23 ENCOUNTER — Ambulatory Visit: Payer: Medicaid Other | Admitting: Adult Health

## 2021-12-21 ENCOUNTER — Other Ambulatory Visit: Payer: Self-pay

## 2021-12-21 ENCOUNTER — Emergency Department (HOSPITAL_COMMUNITY)
Admission: EM | Admit: 2021-12-21 | Discharge: 2021-12-21 | Discharge status: Against Medical Advice | Payer: Medicaid Other | Attending: Emergency Medicine | Admitting: Emergency Medicine

## 2021-12-21 ENCOUNTER — Encounter (HOSPITAL_COMMUNITY): Payer: Self-pay

## 2021-12-21 DIAGNOSIS — K0889 Other specified disorders of teeth and supporting structures: Secondary | ICD-10-CM | POA: Insufficient documentation

## 2021-12-21 DIAGNOSIS — Z5321 Procedure and treatment not carried out due to patient leaving prior to being seen by health care provider: Secondary | ICD-10-CM | POA: Insufficient documentation

## 2021-12-21 NOTE — ED Triage Notes (Signed)
Pt presents to ED with complaints of right side dental pain since yesterday.

## 2021-12-22 ENCOUNTER — Encounter: Payer: Self-pay | Admitting: Emergency Medicine

## 2021-12-22 ENCOUNTER — Ambulatory Visit
Admission: EM | Admit: 2021-12-22 | Discharge: 2021-12-22 | Disposition: A | Discharge status: Home / Self Care | Payer: Medicaid Other

## 2021-12-22 ENCOUNTER — Ambulatory Visit
Admission: EM | Admit: 2021-12-22 | Discharge: 2021-12-22 | Disposition: A | Discharge status: Home / Self Care | Payer: Medicaid Other | Attending: Family Medicine | Admitting: Family Medicine

## 2021-12-22 DIAGNOSIS — K047 Periapical abscess without sinus: Secondary | ICD-10-CM

## 2021-12-22 MED ORDER — CHLORHEXIDINE GLUCONATE 0.12 % MT SOLN
15.0000 mL | Freq: Two times a day (BID) | OROMUCOSAL | 0 refills | Status: DC
Start: 2021-12-22 — End: 2022-08-18

## 2021-12-22 MED ORDER — AMOXICILLIN-POT CLAVULANATE 875-125 MG PO TABS: 1.0000 | ORAL_TABLET | Freq: Two times a day (BID) | ORAL | 0 refills | Status: DC

## 2021-12-22 NOTE — ED Triage Notes (Signed)
Pt present right side dental pain, symptoms started three days ago.

## 2021-12-22 NOTE — ED Triage Notes (Signed)
Right side upper dental pain that started 3 days ago

## 2021-12-22 NOTE — ED Provider Notes (Signed)
[facility]    CSN: [id number] Arrival date & time: 12/22/21  1713      History   Chief Complaint No chief complaint on file.   HPI Janet Dixon is a 36 y.o. female.   Patient presenting today with 3-day history of progressively worsening right-sided dental pain, facial swelling.  States has had issues in the same area in the past.  Endorses having a dentist but has not been able to get in with them yet for this incident.  States her pain is severe, 8 out of 10 and not improved with Tylenol.  Denies fever, chills, difficulty breathing or swallowing.    Past Medical History:  Diagnosis Date   Asthma    History of chlamydia    History of gonorrhea    History of trichomoniasis    Seizures ([place])     Patient Active Problem List   Diagnosis Date Noted   Vaginal discharge 08/21/2019   Screening examination for STD (sexually transmitted disease) 08/21/2019   Pregnancy examination or test, negative result 08/21/2019   Proctocolitis 02/18/2019   Intractable nausea and vomiting 02/16/2019   Abdominal pain    Urinary tract infection without hematuria    Intractable vomiting    Pyelonephritis 08/04/2018   Status post repeat low transverse cesarean section 12/09/2017   Drug use affecting pregnancy 06/08/2017   Elevated lipase 04/14/2017   Stomach pain 04/14/2017   History of C-section 04/14/2017   Hematuria 12/07/2016   History of chlamydia 12/07/2016   Papanicolaou smear of cervix with positive high risk human papilloma virus (HPV) test 09/07/2016   Trichimoniasis 08/29/2016   Seizure disorder ([place]) 05/01/2014   Cocaine abuse ([place]) 03/14/2014    Past Surgical History:  Procedure Laterality Date   CESAREAN SECTION     CESAREAN SECTION N/A 12/02/2014   Procedure: REPEAT CESAREAN SECTION;  Surgeon: Jonnie Kind, MD;  Location: [facility];  Service: Obstetrics;  Laterality: N/A;   CESAREAN SECTION N/A 12/09/2017   Procedure: CESAREAN SECTION;  Surgeon: Jonnie Kind, MD;  Location: [place];  Service: Obstetrics;  Laterality: N/A;    OB History     Gravida  9   Para  7   Term  7   Preterm      AB  0   Living  7      SAB  0   IAB      Ectopic      Multiple  0   Live Births  7            Home Medications    Prior to Admission medications   Medication Sig Start Date End Date Taking? Authorizing Provider  amoxicillin-clavulanate (AUGMENTIN) 875-125 MG tablet Take 1 tablet by mouth every 12 (twelve) hours. 12/22/21  Yes Volney American, PA-C  chlorhexidine (PERIDEX) 0.12 % solution Use as directed 15 mLs in the mouth or throat 2 (two) times daily. 12/22/21  Yes Volney American, PA-C  albuterol (PROVENTIL HFA;VENTOLIN HFA) 108 (90 Base) MCG/ACT inhaler Inhale 2 puffs into the lungs every 6 (six) hours as needed for wheezing or shortness of breath. 08/29/16   Estill Dooms, NP  cephALEXin (KEFLEX) 500 MG capsule Take 1 capsule (500 mg total) by mouth 2 (two) times daily. 10/24/21   Volney American, PA-C  HYDROcodone-acetaminophen (NORCO/VICODIN) 5-325 MG tablet Take 2 tablets by mouth every 4 (four) hours as needed. 06/17/21   Florian Buff, MD  levETIRAcetam (KEPPRA)  500 MG tablet Take 1 tablet (500 mg total) by mouth 2 (two) times daily. 12/20/19   Hayden Rasmussen, MD  metoCLOPramide (REGLAN) 10 MG tablet Take 1 tablet (10 mg total) by mouth every 8 (eight) hours as needed for nausea or vomiting. 12/27/19   Rolland Porter, MD  metroNIDAZOLE (FLAGYL) 500 MG tablet Take 1 tablet (500 mg total) by mouth 2 (two) times daily. 10/26/21   Lamptey, Myrene Galas, MD  potassium chloride (K-DUR) 10 MEQ tablet Take 1 tablet (10 mEq total) by mouth 2 (two) times daily for 3 days. Patient not taking: Reported on 11/08/2018 08/05/18 01/09/19  Harriet Butte, DO    Family History Family History  Problem Relation Age of Onset   Hypertension Mother    Asthma Brother    Asthma Daughter    Eczema Daughter    Asthma  Son    Eczema Son    Aneurysm Maternal Grandmother    Heart disease Maternal Grandmother    Cancer Maternal Grandmother    Asthma Daughter    Eczema Daughter    Asthma Son    Eczema Son    Eczema Son    Cancer Maternal Grandfather        colon   Eczema Maternal Aunt     Social History Social History   Tobacco Use   Smoking status: Every Day    Packs/day: 0.50    Years: 13.00    Total pack years: 6.50    Types: Cigarettes   Smokeless tobacco: Never   Tobacco comments:    1-2 per day  Vaping Use   Vaping Use: Never used  Substance Use Topics   Alcohol use: No    Comment: none x 1 month ago   Drug use: Yes    Types: Codeine, IV     Allergies   Macrobid [nitrofurantoin macrocrystal], Diphenhydramine hcl, Ibuprofen, and Flexeril [cyclobenzaprine hcl]   Review of Systems Review of Systems Per HPI  Physical Exam Triage Vital Signs ED Triage Vitals  Enc Vitals Group     BP 12/22/21 1739 124/84     Pulse Rate 12/22/21 1739 89     Resp 12/22/21 1739 16     Temp 12/22/21 1739 99.2 F (37.3 C)     Temp Source 12/22/21 1739 Oral     SpO2 12/22/21 1739 98 %     Weight --      Height --      Head Circumference --      Peak Flow --      Pain Score 12/22/21 1741 8     Pain Loc --      Pain Edu? --      Excl. in [place]? --    No data found.  Updated Vital Signs BP 124/84 (BP Location: Right Arm)   Pulse 89   Temp 99.2 F (37.3 C) (Oral)   Resp 16   LMP 11/16/2021 (Approximate)   SpO2 98%   Visual Acuity Right Eye Distance:   Left Eye Distance:   Bilateral Distance:    Right Eye Near:   Left Eye Near:    Bilateral Near:     Physical Exam Vitals and nursing note reviewed.  Constitutional:      Appearance: Normal appearance. She is not ill-appearing.  HENT:     Head: Atraumatic.     Mouth/Throat:     Mouth: Mucous membranes are moist.     Comments: Left lower posterior molars with surrounding gingival erythema,  edema.  Coordinating right-sided lower  facial swelling Eyes:     Extraocular Movements: Extraocular movements intact.     Conjunctiva/sclera: Conjunctivae normal.  Cardiovascular:     Rate and Rhythm: Normal rate.  Pulmonary:     Effort: Pulmonary effort is normal.  Musculoskeletal:        General: Normal range of motion.     Cervical back: Normal range of motion and neck supple.  Skin:    General: Skin is warm and dry.  Neurological:     Mental Status: She is alert and oriented to person, place, and time.  Psychiatric:        Behavior: Behavior normal.      UC Treatments / Results  Labs (all labs ordered are listed, but only abnormal results are displayed) Labs Reviewed - No data to display  EKG   Radiology No results found.  Procedures Procedures (including critical care time)  Medications Ordered in UC Medications - No data to display  Initial Impression / Assessment and Plan / UC Course  I have reviewed the triage vital signs and the nursing notes.  Pertinent labs & imaging results that were available during my care of the patient were reviewed by me and considered in my medical decision making (see chart for details).     We will treat for dental infection with Augmentin, Peridex rinse.  Discussed over-the-counter pain relievers, close dental follow-up.  Return for worsening symptoms.  Final Clinical Impressions(s) / UC Diagnoses   Final diagnoses:  Dental infection     Discharge Instructions      May take over-the-counter pain relievers as needed in addition to the medications that I have prescribed.  Make sure to follow-up with a dentist as soon as possible as this is only a temporary fix for the issue.    ED Prescriptions     Medication Sig Dispense Auth. Provider   amoxicillin-clavulanate (AUGMENTIN) 875-125 MG tablet Take 1 tablet by mouth every 12 (twelve) hours. 14 tablet Volney American, [place]   chlorhexidine (PERIDEX) 0.12 % solution Use as directed 15 mLs in the mouth  or throat 2 (two) times daily. 120 mL Volney American, [place]      PDMP not reviewed this encounter.   Volney American, [place] 12/22/21 1817

## 2021-12-22 NOTE — Discharge Instructions (Addendum)
May take over-the-counter pain relievers as needed in addition to the medications that I have prescribed.  Make sure to follow-up with a dentist as soon as possible as this is only a temporary fix for the issue.

## 2021-12-22 NOTE — ED Notes (Signed)
Patient was triage and in the middle of triage she asked for percocet. Staff informed patient that we do not give out control substance.   After triage patient she came out the room and states she will go back to the hospital and wait their.  I informed her that the hospital does not give out those control substance as well but pt states she will take her chances.

## 2022-02-24 ENCOUNTER — Ambulatory Visit: Payer: Self-pay | Admitting: Adult Health

## 2022-02-24 DIAGNOSIS — Z539 Procedure and treatment not carried out, unspecified reason: Secondary | ICD-10-CM

## 2022-06-07 ENCOUNTER — Emergency Department (HOSPITAL_COMMUNITY): Payer: Self-pay

## 2022-06-07 ENCOUNTER — Encounter (HOSPITAL_COMMUNITY): Payer: Self-pay

## 2022-06-07 ENCOUNTER — Emergency Department (HOSPITAL_COMMUNITY)
Admission: EM | Admit: 2022-06-07 | Discharge: 2022-06-07 | Disposition: A | Discharge status: Home / Self Care | Payer: Self-pay | Attending: Emergency Medicine | Admitting: Emergency Medicine

## 2022-06-07 ENCOUNTER — Other Ambulatory Visit: Payer: Self-pay

## 2022-06-07 DIAGNOSIS — M25561 Pain in right knee: Secondary | ICD-10-CM | POA: Insufficient documentation

## 2022-06-07 DIAGNOSIS — M25562 Pain in left knee: Secondary | ICD-10-CM | POA: Insufficient documentation

## 2022-06-07 MED ORDER — MELOXICAM 7.5 MG PO TABS: 7.5000 mg | ORAL_TABLET | Freq: Every day | ORAL | 1 refills | Status: DC

## 2022-06-07 NOTE — ED Provider Notes (Signed)
[facility] Provider Note   CSN: [id number] Arrival date & time: 06/07/22  1454     History  Chief Complaint  Patient presents with   Knee Pain    Janet Dixon is a 37 y.o. female.  Patient complains of pain in both of her knees.  Patient reports that she has a family history of arthritis and neuropathy.  Patient is concerned that the pain in her knees could be neuropathy.  Patient is not diabetic she denies any history of hypertension.  Patient reports she does have pain when she walks  The history is provided by the patient. No language interpreter was used.  Knee Pain Location:  Knee Injury: no   Knee location:  L knee and R knee Pain details:    Quality:  Aching   Radiates to:  Does not radiate Chronicity:  New Dislocation: no   Relieved by:  Nothing Worsened by:  Nothing      Home Medications Prior to Admission medications   Medication Sig Start Date End Date Taking? Authorizing Provider  meloxicam (MOBIC) 7.5 MG tablet Take 1 tablet (7.5 mg total) by mouth daily. 06/07/22  Yes Caryl Ada K, PA-C  albuterol (PROVENTIL HFA;VENTOLIN HFA) 108 (90 Base) MCG/ACT inhaler Inhale 2 puffs into the lungs every 6 (six) hours as needed for wheezing or shortness of breath. 08/29/16   Estill Dooms, NP  amoxicillin-clavulanate (AUGMENTIN) 875-125 MG tablet Take 1 tablet by mouth every 12 (twelve) hours. 12/22/21   Volney American, PA-C  cephALEXin (KEFLEX) 500 MG capsule Take 1 capsule (500 mg total) by mouth 2 (two) times daily. 10/24/21   Volney American, PA-C  chlorhexidine (PERIDEX) 0.12 % solution Use as directed 15 mLs in the mouth or throat 2 (two) times daily. 12/22/21   Volney American, PA-C  HYDROcodone-acetaminophen (NORCO/VICODIN) 5-325 MG tablet Take 2 tablets by mouth every 4 (four) hours as needed. 06/17/21   Florian Buff, MD  levETIRAcetam (KEPPRA) 500 MG tablet Take 1 tablet (500 mg total) by  mouth 2 (two) times daily. 12/20/19   Hayden Rasmussen, MD  metoCLOPramide (REGLAN) 10 MG tablet Take 1 tablet (10 mg total) by mouth every 8 (eight) hours as needed for nausea or vomiting. 12/27/19   Rolland Porter, MD  metroNIDAZOLE (FLAGYL) 500 MG tablet Take 1 tablet (500 mg total) by mouth 2 (two) times daily. 10/26/21   Lamptey, Myrene Galas, MD  potassium chloride (K-DUR) 10 MEQ tablet Take 1 tablet (10 mEq total) by mouth 2 (two) times daily for 3 days. Patient not taking: Reported on 11/08/2018 08/05/18 01/09/19  Harriet Butte, DO      Allergies    Macrobid [nitrofurantoin macrocrystal], Diphenhydramine hcl, Ibuprofen, and Flexeril [cyclobenzaprine hcl]    Review of Systems   Review of Systems  All other systems reviewed and are negative.   Physical Exam Updated Vital Signs BP 104/84 (BP Location: Right Arm)   Pulse 82   Temp 98.7 F (37.1 C) (Oral)   Resp 18   Ht '5\' 1"'$  (1.549 m)   Wt 59 kg   SpO2 97%   BMI 24.56 kg/m  Physical Exam Vitals and nursing note reviewed.  Constitutional:      General: She is not in acute distress.    Appearance: She is well-developed.  HENT:     Head: Normocephalic and atraumatic.  Eyes:     Conjunctiva/sclera: Conjunctivae normal.  Cardiovascular:  Rate and Rhythm: Normal rate.  Pulmonary:     Effort: Pulmonary effort is normal. No respiratory distress.  Musculoskeletal:        General: No tenderness or deformity.     Comments: Full range of motion bilateral knees neurovascular neurosensory are intact  Skin:    General: Skin is warm and dry.     Capillary Refill: Capillary refill takes less than 2 seconds.  Neurological:     Mental Status: She is alert.  Psychiatric:        Mood and Affect: Mood normal.     ED Results / Procedures / Treatments   Labs (all labs ordered are listed, but only abnormal results are displayed) Labs Reviewed - No data to display  EKG None  Radiology DG Knee 2 Views Right  Result Date:  06/07/2022 CLINICAL DATA:  Right knee pain for 1 year EXAM: RIGHT KNEE - 1-2 VIEW COMPARISON:  None Available. FINDINGS: Frontal and lateral views of the right knee are obtained. No acute fracture, subluxation, or dislocation. Fragmentation at the tibial tubercle could reflect sequela of Osgood-Schlatter disease. Joint spaces are well preserved. No joint effusion. Soft tissues are unremarkable. IMPRESSION: 1. 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 2. No acute bony abnormality. Electronically Signed   By: Randa Ngo M.D.   On: 06/07/2022 16:14   DG Knee 2 Views Left  Result Date: 06/07/2022 CLINICAL DATA:  Knee pain in the RIGHT and LEFT knee for 1 year. EXAM: LEFT KNEE - 1-2 VIEW COMPARISON:  Contralateral knee of the same date. FINDINGS: No evidence of fracture, dislocation, or joint effusion. No evidence of arthropathy or other focal bone abnormality. Soft tissues are unremarkable. IMPRESSION: Negative. Electronically Signed   By: Zetta Bills M.D.   On: 06/07/2022 16:11    Procedures Procedures    Medications Ordered in ED Medications - No data to display  ED Course/ Medical Decision Making/ A&P                             Medical Decision Making Patient complains of bilateral knee pain patient is worried that she has arthritis.  Patient denies any history of Osgood Slaughter disease patient reports she does not recall having any knee pain in her teenage years.  Amount and/or Complexity of Data Reviewed Radiology: ordered and independent interpretation performed. Decision-making details documented in ED Course.    Details: Xray right knee shows fragmentation of the tibial tubercle consistent with Porfirio Oar disease Knee no acute findings  Risk Prescription drug management. Risk Details: Counseled on x-ray results patient is advised to follow-up with orthopedist she is given a prescription for meloxicam.           Final  Clinical Impression(s) / ED Diagnoses Final diagnoses:  Pain in both knees, unspecified chronicity    Rx / DC Orders ED Discharge Orders          Ordered    meloxicam (MOBIC) 7.5 MG tablet  Daily        06/07/22 1657           An After Visit Summary was printed and given to the patient.    Fransico Meadow, [place] 06/07/22 1754    Sherwood Gambler, MD 06/10/22 [phone]

## 2022-06-07 NOTE — ED Triage Notes (Signed)
Complains of bilateral knee pain without injury.  Reports family has hx of neuropathy and arthritis.  Reports last night and today its been worse.

## 2022-06-07 NOTE — ED Notes (Signed)
See triage notes. Nad. Rom wnl

## 2022-06-07 NOTE — Discharge Instructions (Addendum)
Return if any problems.

## 2022-08-11 ENCOUNTER — Encounter: Payer: Self-pay | Admitting: Adult Health

## 2022-08-16 ENCOUNTER — Encounter: Payer: Self-pay | Admitting: Adult Health

## 2022-08-18 ENCOUNTER — Ambulatory Visit (INDEPENDENT_AMBULATORY_CARE_PROVIDER_SITE_OTHER): Payer: Medicaid Other | Admitting: *Deleted

## 2022-08-18 VITALS — BP 111/69 | HR 90 | Wt 153.0 lb

## 2022-08-18 DIAGNOSIS — Z3201 Encounter for pregnancy test, result positive: Secondary | ICD-10-CM | POA: Diagnosis not present

## 2022-08-18 DIAGNOSIS — N926 Irregular menstruation, unspecified: Secondary | ICD-10-CM

## 2022-08-18 LAB — POCT URINE PREGNANCY: Preg Test, Ur: POSITIVE — AB

## 2022-08-18 NOTE — Progress Notes (Signed)
   NURSE VISIT- PREGNANCY CONFIRMATION   SUBJECTIVE:  Janet Dixon is a 37 y.o. [id number] female at [redacted]w[redacted]d by certain LMP of Patient's last menstrual period was 06/29/2022. Here for pregnancy confirmation.  Home pregnancy test: positive x 2   She reports no complaints.  She is not taking prenatal vitamins.    OBJECTIVE:  BP 111/69 (BP Location: Right Arm, Patient Position: Sitting, Cuff Size: Normal)   Pulse 90   Wt 153 lb (69.4 kg)   LMP 06/29/2022   BMI 28.91 kg/m   Appears well, in no apparent distress  Results for orders placed or performed in visit on 08/18/22 (from the past 24 hour(s))  POCT urine pregnancy   Collection Time: 08/18/22  4:14 PM  Result Value Ref Range   Preg Test, Ur Positive (A) Negative    ASSESSMENT: Positive pregnancy test, [redacted]w[redacted]d by LMP    PLAN: Schedule for dating ultrasound in 2 weeks Prenatal vitamins: plans to begin OTC ASAP   Nausea medicines: not currently needed   OB packet given: Yes  Jobe Marker  08/18/2022 4:15 PM

## 2022-08-20 ENCOUNTER — Encounter (HOSPITAL_COMMUNITY): Payer: Self-pay | Admitting: Emergency Medicine

## 2022-08-20 ENCOUNTER — Emergency Department (HOSPITAL_COMMUNITY)
Admission: EM | Admit: 2022-08-20 | Discharge: 2022-08-20 | Disposition: A | Discharge status: Home / Self Care | Payer: Medicaid Other | Attending: Emergency Medicine | Admitting: Emergency Medicine

## 2022-08-20 ENCOUNTER — Other Ambulatory Visit: Payer: Self-pay

## 2022-08-20 DIAGNOSIS — H9203 Otalgia, bilateral: Secondary | ICD-10-CM | POA: Diagnosis present

## 2022-08-20 DIAGNOSIS — H6123 Impacted cerumen, bilateral: Secondary | ICD-10-CM | POA: Insufficient documentation

## 2022-08-20 MED ORDER — CARBAMIDE PEROXIDE 6.5 % OT SOLN: 5.0000 [drp] | Freq: Two times a day (BID) | OTIC | 0 refills | Status: AC

## 2022-08-20 NOTE — Discharge Instructions (Addendum)
Seen today for trouble hearing out of your left ear.  You have a lot of wax in both ears I was able to partially remove it, but not completely you are being given a prescription for Debrox drops to use.  Please follow-up with her PCP and/or her ENT.  Come back to the ER for new or worsening symptoms.

## 2022-08-20 NOTE — ED Triage Notes (Signed)
Pt via POV c/o left ear pain x 2 weeks. No drainage reported, no sore throat or nasal congestion. Pressure in ear, no real pain.

## 2022-08-20 NOTE — ED Provider Notes (Signed)
[facility] EMERGENCY DEPARTMENT AT [facility] Provider Note   CSN: [id number] Arrival date & time: 08/20/22  1254     History  Chief Complaint  Patient presents with   Otalgia    Janet Dixon is a 37 y.o. female.  She presents ER complaining of left ear being "stopped up" for the past 2 to 3 weeks.  Denies any pain fever or drainage.   Otalgia      Home Medications Prior to Admission medications   Medication Sig Start Date End Date Taking? Authorizing Provider  carbamide peroxide (DEBROX) 6.5 % OTIC solution Place 5 drops into both ears 2 (two) times daily for 5 days. 08/20/22 08/25/22 Yes Anhelica Fowers A, PA-C  albuterol (PROVENTIL HFA;VENTOLIN HFA) 108 (90 Base) MCG/ACT inhaler Inhale 2 puffs into the lungs every 6 (six) hours as needed for wheezing or shortness of breath. Patient not taking: Reported on 08/18/2022 08/29/16   Cyril Mourning A, NP  potassium chloride (K-DUR) 10 MEQ tablet Take 1 tablet (10 mEq total) by mouth 2 (two) times daily for 3 days. Patient not taking: Reported on 11/08/2018 08/05/18 01/09/19  Durward Parcel, DO      Allergies    Macrobid [nitrofurantoin macrocrystal], Diphenhydramine hcl, Ibuprofen, and Flexeril [cyclobenzaprine hcl]    Review of Systems   Review of Systems  HENT:  Positive for ear pain.     Physical Exam Updated Vital Signs BP 118/73 (BP Location: Right Arm)   Pulse 84   Temp 98.4 F (36.9 C) (Oral)   Resp 20   Ht 5' (1.524 m)   Wt 71.7 kg   LMP 06/29/2022   SpO2 100%   BMI 30.86 kg/m  Physical Exam Vitals and nursing note reviewed.  Constitutional:      General: She is not in acute distress.    Appearance: She is well-developed.  HENT:     Head: Normocephalic and atraumatic.     Right Ear: No drainage, swelling or tenderness. There is impacted cerumen.     Left Ear: No drainage, swelling or tenderness. There is impacted cerumen.  Eyes:     Conjunctiva/sclera: Conjunctivae normal.  Cardiovascular:      Rate and Rhythm: Normal rate and regular rhythm.     Heart sounds: No murmur heard. Pulmonary:     Effort: Pulmonary effort is normal. No respiratory distress.     Breath sounds: Normal breath sounds.  Abdominal:     Palpations: Abdomen is soft.     Tenderness: There is no abdominal tenderness.  Musculoskeletal:        General: No swelling.     Cervical back: Neck supple.  Skin:    General: Skin is warm and dry.     Capillary Refill: Capillary refill takes less than 2 seconds.  Neurological:     Mental Status: She is alert.  Psychiatric:        Mood and Affect: Mood normal.     ED Results / Procedures / Treatments   Labs (all labs ordered are listed, but only abnormal results are displayed) Labs Reviewed - No data to display  EKG None  Radiology No results found.  Procedures .Ear Cerumen Removal  Date/Time: 08/20/2022 10:45 PM  Performed by: Ma Rings, PA-C Authorized by: Ma Rings, PA-C   Consent:    Consent obtained:  Verbal   Consent given by:  Patient   Risks discussed:  Bleeding, infection, pain, incomplete removal, TM perforation and dizziness  Alternatives discussed:  No treatment, referral and alternative treatment Universal protocol:    Procedure explained and questions answered to patient or proxy's satisfaction: yes   Procedure details:    Location:  L ear   Procedure type: irrigation     Successful cerumen removal: Cerumen partially removed.   Post-procedure details:    Hearing quality:  Improved   Procedure completion:  Procedure terminated at patient's request     Medications Ordered in ED Medications - No data to display  ED Course/ Medical Decision Making/ A&P                             Medical Decision Making Risk OTC drugs.   DDx: Cerumen impaction, otitis media, otitis externa, other  ED course: The patient presents for decreased hearing left ear and that it feels "stopped up" x 3 weeks.  Noted on exam is  cerumen impaction bilaterally left greater than right.  Attempted removal and got a moderate amount out but did not completely remove it, patient asked to stop, she was having some discomfort, there is no bleeding, discussed Debrox drops and ENT follow-up.          Final Clinical Impression(s) / ED Diagnoses Final diagnoses:  Bilateral impacted cerumen    Rx / DC Orders ED Discharge Orders          Ordered    carbamide peroxide (DEBROX) 6.5 % OTIC solution  2 times daily        08/20/22 [address] [place] A, PA-C 08/20/22 2247    Arby Barrette, MD 08/22/22 1750

## 2022-08-31 ENCOUNTER — Other Ambulatory Visit: Payer: Self-pay | Admitting: Obstetrics & Gynecology

## 2022-08-31 DIAGNOSIS — O3680X0 Pregnancy with inconclusive fetal viability, not applicable or unspecified: Secondary | ICD-10-CM

## 2022-09-01 ENCOUNTER — Ambulatory Visit (INDEPENDENT_AMBULATORY_CARE_PROVIDER_SITE_OTHER): Payer: Medicaid Other

## 2022-09-01 DIAGNOSIS — Z3A09 9 weeks gestation of pregnancy: Secondary | ICD-10-CM | POA: Diagnosis not present

## 2022-09-01 DIAGNOSIS — Z3481 Encounter for supervision of other normal pregnancy, first trimester: Secondary | ICD-10-CM | POA: Diagnosis not present

## 2022-09-01 DIAGNOSIS — O3680X0 Pregnancy with inconclusive fetal viability, not applicable or unspecified: Secondary | ICD-10-CM

## 2022-09-01 NOTE — Progress Notes (Signed)
[name] 7+6 wks single IUP,CRL 15.0 mm,FHR 167 bpm,normal ovaries

## 2022-09-07 ENCOUNTER — Telehealth: Payer: Self-pay | Admitting: Advanced Practice Midwife

## 2022-09-07 MED ORDER — PROMETHAZINE HCL 12.5 MG PO TABS: 12.5000 mg | ORAL_TABLET | Freq: Four times a day (QID) | ORAL | 1 refills | Status: DC | PRN

## 2022-09-07 NOTE — Addendum Note (Signed)
Addended by: Cyril Mourning A on: 09/07/2022 05:25 PM   Modules accepted: Orders

## 2022-09-07 NOTE — Telephone Encounter (Signed)
Rx sent in for phenergan  

## 2022-09-07 NOTE — Telephone Encounter (Signed)
Pt states she has nausea and is vomiting. Please advise.

## 2022-09-07 NOTE — Telephone Encounter (Signed)
Returned patient's call. Unable to leave VM.  Will send message to provider to send in nausea medication to pharmacy on chart.

## 2022-09-21 ENCOUNTER — Encounter: Payer: Self-pay | Admitting: Advanced Practice Midwife

## 2022-09-22 ENCOUNTER — Encounter: Payer: Self-pay | Admitting: Advanced Practice Midwife

## 2022-09-22 ENCOUNTER — Encounter: Payer: Self-pay | Admitting: *Deleted

## 2022-09-26 ENCOUNTER — Other Ambulatory Visit (HOSPITAL_COMMUNITY)
Admission: RE | Admit: 2022-09-26 | Discharge: 2022-09-26 | Disposition: A | Discharge status: Home / Self Care | Payer: Medicaid Other | Source: Ambulatory Visit | Attending: Obstetrics & Gynecology | Admitting: Obstetrics & Gynecology

## 2022-09-26 ENCOUNTER — Other Ambulatory Visit (INDEPENDENT_AMBULATORY_CARE_PROVIDER_SITE_OTHER): Payer: Medicaid Other

## 2022-09-26 VITALS — BP 104/58 | HR 86

## 2022-09-26 DIAGNOSIS — Z202 Contact with and (suspected) exposure to infections with a predominantly sexual mode of transmission: Secondary | ICD-10-CM | POA: Insufficient documentation

## 2022-09-26 DIAGNOSIS — Z113 Encounter for screening for infections with a predominantly sexual mode of transmission: Secondary | ICD-10-CM | POA: Insufficient documentation

## 2022-09-26 MED ORDER — CEFTRIAXONE SODIUM 500 MG IJ SOLR
500.0000 mg | Freq: Once | INTRAMUSCULAR | Status: AC
  Administered 2022-09-26: 500 mg via INTRAMUSCULAR

## 2022-09-26 NOTE — Progress Notes (Signed)
   NURSE VISIT- STD  SUBJECTIVE:  Janet Dixon is a 37 y.o. [id number] [redacted]w[redacted]d pregnantfemale here for a vaginal swab for STD screen.  She reports the following symptoms: odor for 2  days. Partner was tested for STD at Urgent Care on 6/12 due to penile discharge and was positive for gonorrhea.  Partner with patient during visit and did provide mychart result. Denies abnormal vaginal bleeding, significant pelvic pain, fever, or UTI symptoms.  OBJECTIVE:  BP (!) 104/58 (BP Location: Left Arm, Patient Position: Sitting, Cuff Size: Normal)   Pulse 86   LMP 06/29/2022   Appears well, in no apparent distress  ASSESSMENT: Vaginal swab for STD screen  PLAN: Self-collected vaginal probe for Gonorrhea, Chlamydia, Trichomonas sent to lab Rocephin 500mg  given today per Cyril Mourning due to [facility] Treatment: to be determined once results are received Follow-up as needed if symptoms persist/worsen, or new symptoms develop  Jobe Marker  09/26/2022 4:26 PM

## 2022-09-28 LAB — CERVICOVAGINAL ANCILLARY ONLY
Chlamydia: NEGATIVE
Comment: NEGATIVE
Comment: NEGATIVE
Comment: NORMAL
Neisseria Gonorrhea: POSITIVE — AB
Trichomonas: POSITIVE — AB

## 2022-09-29 ENCOUNTER — Other Ambulatory Visit: Payer: Self-pay | Admitting: Adult Health

## 2022-09-29 ENCOUNTER — Encounter: Payer: Self-pay | Admitting: Adult Health

## 2022-09-29 MED ORDER — METRONIDAZOLE 500 MG PO TABS: 500.0000 mg | ORAL_TABLET | Freq: Two times a day (BID) | ORAL | 0 refills | Status: DC

## 2022-10-11 ENCOUNTER — Ambulatory Visit: Payer: Self-pay | Admitting: *Deleted

## 2022-10-11 ENCOUNTER — Encounter: Payer: Self-pay | Admitting: Women's Health

## 2022-11-04 ENCOUNTER — Telehealth: Payer: Self-pay

## 2022-11-04 NOTE — Telephone Encounter (Signed)
Called patient on 7/26 to let her know we had to adjust her new ob visit appointment time to 1:30 pm. Left voicemail and sent mychart.

## 2022-11-09 ENCOUNTER — Encounter: Payer: Self-pay | Admitting: *Deleted

## 2022-11-09 ENCOUNTER — Ambulatory Visit (INDEPENDENT_AMBULATORY_CARE_PROVIDER_SITE_OTHER): Payer: Medicaid Other | Admitting: Advanced Practice Midwife

## 2022-11-09 ENCOUNTER — Other Ambulatory Visit (HOSPITAL_COMMUNITY)
Admission: RE | Admit: 2022-11-09 | Discharge: 2022-11-09 | Disposition: A | Discharge status: Home / Self Care | Payer: Medicaid Other | Source: Ambulatory Visit | Attending: Women's Health | Admitting: Women's Health

## 2022-11-09 ENCOUNTER — Encounter: Payer: Self-pay | Admitting: Advanced Practice Midwife

## 2022-11-09 VITALS — BP 106/70 | HR 94 | Wt 164.0 lb

## 2022-11-09 DIAGNOSIS — Z3A17 17 weeks gestation of pregnancy: Secondary | ICD-10-CM | POA: Diagnosis present

## 2022-11-09 DIAGNOSIS — O09522 Supervision of elderly multigravida, second trimester: Secondary | ICD-10-CM

## 2022-11-09 DIAGNOSIS — Z1379 Encounter for other screening for genetic and chromosomal anomalies: Secondary | ICD-10-CM

## 2022-11-09 DIAGNOSIS — Z3480 Encounter for supervision of other normal pregnancy, unspecified trimester: Secondary | ICD-10-CM

## 2022-11-09 DIAGNOSIS — Z98891 History of uterine scar from previous surgery: Secondary | ICD-10-CM

## 2022-11-09 DIAGNOSIS — Z124 Encounter for screening for malignant neoplasm of cervix: Secondary | ICD-10-CM | POA: Insufficient documentation

## 2022-11-09 DIAGNOSIS — Z363 Encounter for antenatal screening for malformations: Secondary | ICD-10-CM

## 2022-11-09 DIAGNOSIS — Z3482 Encounter for supervision of other normal pregnancy, second trimester: Secondary | ICD-10-CM

## 2022-11-09 DIAGNOSIS — O09529 Supervision of elderly multigravida, unspecified trimester: Secondary | ICD-10-CM | POA: Insufficient documentation

## 2022-11-09 MED ORDER — ASPIRIN 81 MG PO CHEW: 162.0000 mg | CHEWABLE_TABLET | Freq: Every day | ORAL | 7 refills | Status: DC

## 2022-11-09 NOTE — Patient Instructions (Signed)
Francetta, thank you for choosing our office today! We appreciate the opportunity to meet your healthcare needs. You may receive a short survey by mail, e-mail, or through MyChart. If you are happy with your care we would appreciate if you could take just a few minutes to complete the survey questions. We read all of your comments and take your feedback very seriously. Thank you again for choosing our office.  [facility]  [facility] ([address] [place],  [address]) Entrance C, located off of [address] Free 24/7 valet parking   Nausea & Vomiting Have saltine crackers or pretzels by your bed and eat a few bites before you raise your head out of bed in the morning Eat small frequent meals throughout the day instead of large meals Drink plenty of fluids throughout the day to stay hydrated, just don't drink a lot of fluids with your meals.  This can make your stomach fill up faster making you feel sick Do not brush your teeth right after you eat Products with real ginger are good for nausea, like ginger ale and ginger hard candy Make sure it says made with real ginger! Sucking on sour candy like lemon heads is also good for nausea If your prenatal vitamins make you nauseated, take them at night so you will sleep through the nausea Sea Bands If you feel like you need medicine for the nausea & vomiting please let us know If you are unable to keep any fluids or food down please let us know   Constipation Drink plenty of fluid, preferably water, throughout the day Eat foods high in fiber such as fruits, vegetables, and grains Exercise, such as walking, is a good way to keep your bowels regular Drink warm fluids, especially warm prune juice, or decaf coffee Eat a 1/2 cup of real oatmeal (not instant), 1/2 cup applesauce, and 1/2-1 cup warm prune juice every day If needed, you may take Colace (docusate sodium) stool softener  once or twice a day to help keep the stool soft.  If you still are having problems with constipation, you may take Miralax once daily as needed to help keep your bowels regular.   Home Blood Pressure Monitoring for Patients   Your provider has recommended that you check your blood pressure (BP) at least once a week at home. If you do not have a blood pressure cuff at home, one will be provided for you. Contact your provider if you have not received your monitor within 1 week.   Helpful Tips for Accurate Home Blood Pressure Checks  Don't smoke, exercise, or drink caffeine 30 minutes before checking your BP Use the restroom before checking your BP (a full bladder can raise your pressure) Relax in a comfortable upright chair Feet on the ground Left arm resting comfortably on a flat surface at the level of your heart Legs uncrossed Back supported Sit quietly and don't talk Place the cuff on your bare arm Adjust snuggly, so that only two fingertips can fit between your skin and the top of the cuff Check 2 readings separated by at least one minute Keep a log of your BP readings For a visual, please reference this diagram: http://ccnc.care/bpdiagram  Provider Name: [facility]     Phone: [phone]  Zone 1: ALL CLEAR  Continue to monitor your symptoms:  BP reading is less than 140 (top number) or less than 90 (bottom   number)  No right upper stomach pain No headaches or seeing spots No feeling nauseated or throwing up No swelling in face and hands  Zone 2: CAUTION Call your doctor's office for any of the following:  BP reading is greater than 140 (top number) or greater than 90 (bottom number)  Stomach pain under your ribs in the middle or right side Headaches or seeing spots Feeling nauseated or throwing up Swelling in face and hands  Zone 3: EMERGENCY  Seek immediate medical care if you have any of the following:  BP reading is greater than160 (top number) or greater than  110 (bottom number) Severe headaches not improving with Tylenol Serious difficulty catching your breath Any worsening symptoms from Zone 2    First Trimester of Pregnancy The first trimester of pregnancy is from week 1 until the end of week 12 (months 1 through 3). A week after a sperm fertilizes an egg, the egg will implant on the wall of the uterus. This embryo will begin to develop into a baby. Genes from you and your partner are forming the baby. The female genes determine whether the baby is a boy or a girl. At 6-8 weeks, the eyes and face are formed, and the heartbeat can be seen on ultrasound. At the end of 12 weeks, all the baby's organs are formed.  Now that you are pregnant, you will want to do everything you can to have a healthy baby. Two of the most important things are to get good prenatal care and to follow your health care provider's instructions. Prenatal care is all the medical care you receive before the baby's birth. This care will help prevent, find, and treat any problems during the pregnancy and childbirth. BODY CHANGES Your body goes through many changes during pregnancy. The changes vary from woman to woman.  You may gain or lose a couple of pounds at first. You may feel sick to your stomach (nauseous) and throw up (vomit). If the vomiting is uncontrollable, call your health care provider. You may tire easily. You may develop headaches that can be relieved by medicines approved by your health care provider. You may urinate more often. Painful urination may mean you have a bladder infection. You may develop heartburn as a result of your pregnancy. You may develop constipation because certain hormones are causing the muscles that push waste through your intestines to slow down. You may develop hemorrhoids or swollen, bulging veins (varicose veins). Your breasts may begin to grow larger and become tender. Your nipples may stick out more, and the tissue that surrounds them  (areola) may become darker. Your gums may bleed and may be sensitive to brushing and flossing. Dark spots or blotches (chloasma, mask of pregnancy) may develop on your face. This will likely fade after the baby is born. Your menstrual periods will stop. You may have a loss of appetite. You may develop cravings for certain kinds of food. You may have changes in your emotions from day to day, such as being excited to be pregnant or being concerned that something may go wrong with the pregnancy and baby. You may have more vivid and strange dreams. You may have changes in your hair. These can include thickening of your hair, rapid growth, and changes in texture. Some women also have hair loss during or after pregnancy, or hair that feels dry or thin. Your hair will most likely return to normal after your baby is born. WHAT TO EXPECT AT YOUR PRENATAL  VISITS During a routine prenatal visit: You will be weighed to make sure you and the baby are growing normally. Your blood pressure will be taken. Your abdomen will be measured to track your baby's growth. The fetal heartbeat will be listened to starting around week 10 or 12 of your pregnancy. Test results from any previous visits will be discussed. Your health care provider may ask you: How you are feeling. If you are feeling the baby move. If you have had any abnormal symptoms, such as leaking fluid, bleeding, severe headaches, or abdominal cramping. If you have any questions. Other tests that may be performed during your first trimester include: Blood tests to find your blood type and to check for the presence of any previous infections. They will also be used to check for low iron levels (anemia) and Rh antibodies. Later in the pregnancy, blood tests for diabetes will be done along with other tests if problems develop. Urine tests to check for infections, diabetes, or protein in the urine. An ultrasound to confirm the proper growth and development  of the baby. An amniocentesis to check for possible genetic problems. Fetal screens for spina bifida and Down syndrome. You may need other tests to make sure you and the baby are doing well. HOME CARE INSTRUCTIONS  Medicines Follow your health care provider's instructions regarding medicine use. Specific medicines may be either safe or unsafe to take during pregnancy. Take your prenatal vitamins as directed. If you develop constipation, try taking a stool softener if your health care provider approves. Diet Eat regular, well-balanced meals. Choose a variety of foods, such as meat or vegetable-based protein, fish, milk and low-fat dairy products, vegetables, fruits, and whole grain breads and cereals. Your health care provider will help you determine the amount of weight gain that is right for you. Avoid raw meat and uncooked cheese. These carry germs that can cause birth defects in the baby. Eating four or five small meals rather than three large meals a day may help relieve nausea and vomiting. If you start to feel nauseous, eating a few soda crackers can be helpful. Drinking liquids between meals instead of during meals also seems to help nausea and vomiting. If you develop constipation, eat more high-fiber foods, such as fresh vegetables or fruit and whole grains. Drink enough fluids to keep your urine clear or pale yellow. Activity and Exercise Exercise only as directed by your health care provider. Exercising will help you: Control your weight. Stay in shape. Be prepared for labor and delivery. Experiencing pain or cramping in the lower abdomen or low back is a good sign that you should stop exercising. Check with your health care provider before continuing normal exercises. Try to avoid standing for long periods of time. Move your legs often if you must stand in one place for a long time. Avoid heavy lifting. Wear low-heeled shoes, and practice good posture. You may continue to have sex  unless your health care provider directs you otherwise. Relief of Pain or Discomfort Wear a good support bra for breast tenderness.   Take warm sitz baths to soothe any pain or discomfort caused by hemorrhoids. Use hemorrhoid cream if your health care provider approves.   Rest with your legs elevated if you have leg cramps or low back pain. If you develop varicose veins in your legs, wear support hose. Elevate your feet for 15 minutes, 3-4 times a day. Limit salt in your diet. Prenatal Care Schedule your prenatal visits by the  twelfth week of pregnancy. They are usually scheduled monthly at first, then more often in the last 2 months before delivery. Write down your questions. Take them to your prenatal visits. Keep all your prenatal visits as directed by your health care provider. Safety Wear your seat belt at all times when driving. Make a list of emergency phone numbers, including numbers for family, friends, the hospital, and police and fire departments. General Tips Ask your health care provider for a referral to a local prenatal education class. Begin classes no later than at the beginning of month 6 of your pregnancy. Ask for help if you have counseling or nutritional needs during pregnancy. Your health care provider can offer advice or refer you to specialists for help with various needs. Do not use hot tubs, steam rooms, or saunas. Do not douche or use tampons or scented sanitary pads. Do not cross your legs for long periods of time. Avoid cat litter boxes and soil used by cats. These carry germs that can cause birth defects in the baby and possibly loss of the fetus by miscarriage or stillbirth. Avoid all smoking, herbs, alcohol, and medicines not prescribed by your health care provider. Chemicals in these affect the formation and growth of the baby. Schedule a dentist appointment. At home, brush your teeth with a soft toothbrush and be gentle when you floss. SEEK MEDICAL CARE IF:   You have dizziness. You have mild pelvic cramps, pelvic pressure, or nagging pain in the abdominal area. You have persistent nausea, vomiting, or diarrhea. You have a bad smelling vaginal discharge. You have pain with urination. You notice increased swelling in your face, hands, legs, or ankles. SEEK IMMEDIATE MEDICAL CARE IF:  You have a fever. You are leaking fluid from your vagina. You have spotting or bleeding from your vagina. You have severe abdominal cramping or pain. You have rapid weight gain or loss. You vomit blood or material that looks like coffee grounds. You are exposed to [name] measles and have never had them. You are exposed to fifth disease or chickenpox. You develop a severe headache. You have shortness of breath. You have any kind of trauma, such as from a fall or a car accident. Document Released: 03/22/2001 Document Revised: 08/12/2013 Document Reviewed: 02/05/2013 [facility] Patient Information 2015 [place], [place]. This information is not intended to replace advice given to you by your health care provider. Make sure you discuss any questions you have with your health care provider.

## 2022-11-09 NOTE — Progress Notes (Addendum)
INITIAL OBSTETRICAL VISIT Patient name: Janet Dixon MRN [id number]  Date of birth: [birth year] Chief Complaint:   Initial Prenatal Visit  History of Present Illness:   Janet Dixon is a 37 y.o. [id number] African-American female at [redacted]w[redacted]d by [name] at 7.6 weeks with an Estimated Date of Delivery: 04/14/23 being seen today for her initial obstetrical visit.   Patient's last menstrual period was 06/29/2022. Her obstetrical history is significant for  C/S x 7 (minimal adhesions, per last op note 2019) .   Today she reports no complaints.  Last pap 2018. Results were: NILM w/ HRHPV positive: type not specified     11/09/2022    1:55 PM 05/11/2017    2:00 PM 08/29/2016    3:09 PM  Depression screen PHQ 2/9  Decreased Interest 0 1 0  Down, Depressed, Hopeless 0 2 0  PHQ - 2 Score 0 3 0  Altered sleeping 0 2   Tired, decreased energy 0 2   Change in appetite 0 2   Feeling bad or failure about yourself  0 0   Trouble concentrating 0 0   Moving slowly or fidgety/restless 0 0   Suicidal thoughts 0 0   PHQ-9 Score 0 9         11/09/2022    1:56 PM  GAD 7 : Generalized Anxiety Score  Nervous, Anxious, on Edge 0  Control/stop worrying 0  Worry too much - different things 0  Trouble relaxing 0  Restless 0  Easily annoyed or irritable 0  Afraid - awful might happen 0  Total GAD 7 Score 0     Review of Systems:   Pertinent items are noted in HPI Denies cramping/contractions, leakage of fluid, vaginal bleeding, abnormal vaginal discharge w/ itching/odor/irritation, headaches, visual changes, shortness of breath, chest pain, abdominal pain, severe nausea/vomiting, or problems with urination or bowel movements unless otherwise stated above.  Pertinent History Reviewed:  Reviewed past medical,surgical, social, obstetrical and family history.  Reviewed problem list, medications and allergies. OB History  Gravida Para Term Preterm AB Living  8 7 7    0 7  SAB IAB Ectopic Multiple Live  Births  0     0 7    # Outcome Date GA Lbr Len/2nd Weight Sex Type Anes PTL Lv  8 Current           7 Term 12/09/17 [redacted]w[redacted]d  6 lb 6.8 oz (2.914 kg) F CS-LTranv Spinal  LIV  6 Term 12/02/14 [redacted]w[redacted]d  6 lb 6.1 oz (2.895 kg) F CS-Vac Spinal  LIV  5 Term 04/06/10 [redacted]w[redacted]d  5 lb 4 oz (2.381 kg) F CS-LTranv Spinal  LIV  4 Term 02/20/09 [redacted]w[redacted]d  6 lb 3 oz (2.807 kg) M CS-LTranv Spinal  LIV  3 Term 02/22/07 [redacted]w[redacted]d  6 lb (2.722 kg) M CS-LTranv Spinal  LIV  2 Term 03/22/05 [redacted]w[redacted]d  6 lb 4.9 oz (2.86 kg) M CS-LTranv Spinal N LIV  1 Term 09/08/03 [redacted]w[redacted]d  7 lb 8.1 oz (3.405 kg) F CS-LTranv Spinal N LIV   Physical Assessment:   Vitals:   11/09/22 1359  BP: 106/70  Pulse: 94  Weight: 164 lb (74.4 kg)  Body mass index is 32.03 kg/m.       Physical Examination:  General appearance - well appearing, and in no distress  Mental status - alert, oriented to person, place, and time  Psych:  She has a normal mood and affect  Skin - warm and dry,  normal color, no suspicious lesions noted  Chest - effort normal, all lung fields clear to auscultation bilaterally  Heart - normal rate and regular rhythm  Abdomen - soft, nontender; FHTs 153   Extremities:  No swelling or varicosities noted  Pelvic - VULVA: normal appearing vulva with no masses, tenderness or lesions  VAGINA: normal appearing vagina with normal color and discharge, no lesions  CERVIX: normal appearing cervix with copious gray/white liquid discharge; no lesions, no CMT  Thin prep pap is done with HR HPV cotesting  Chaperone:  Freddie Apley     TODAY'S NT : too late  No results found for this or any previous visit (from the past 24 hour(s)).  Assessment & Plan:  1) Low-Risk Pregnancy [id number] at [redacted]w[redacted]d with an Estimated Date of Delivery: 04/14/23   2) Initial OB visit  3) Prev C/S x 7, minimal adhesions per 2019 op  note; plan for rLTCS @ 39wks  4) AMA, age <40 at delivery  5) Hx seizures, unknown type; dx in [place] in 2015; no meds  6) Recently  tx trich and GC in June, POC today on Pap  Meds:  Meds ordered this encounter  Medications   aspirin 81 MG chewable tablet    Sig: Chew 2 tablets (162 mg total) by mouth daily.    Dispense:  60 tablet    Refill:  7    Order Specific Question:   Supervising Provider    Answer:   Myna Hidalgo [[id number]]    Initial labs obtained Continue prenatal vitamins Reviewed n/v relief measures and warning s/s to report Reviewed recommended weight gain based on pre-gravid BMI Encouraged well-balanced diet Genetic & carrier screening discussed: requests Panorama and AFP, declines Horizon  Ultrasound discussed; fetal survey: requested CCNC completed> form faxed if has or is planning to apply for medicaid The nature of [place] [facility] [employer] with multiple MDs and other Advanced Practice Providers was explained to patient; also emphasized that fellows, residents, and students are part of our team. Does not have home bp cuff. Office bp cuff given: yes. Rx sent: n/a. Check bp weekly, let us know if consistently >140/90.   Indications for ASA therapy (per uptodate) OR Two or more of the following: Obesity (BMI>30 kg/m2) Yes Age ?36 years Yes Sociodemographic characteristics (African American race, low socioeconomic level) Yes  No indications for early A1C (per uptodate)  Follow-up: Return in about 2 weeks (around 11/23/2022) for LROB, [name]: Anatomy.   Orders Placed This Encounter  Procedures   Urine Culture   US OB Comp + 14 Wk   CBC/D/Plt+RPR+Rh+ABO+RubIgG...   PANORAMA PRENATAL TEST   AFP, Serum, Open Spina Bifida    Arabella Merles [facility] 11/09/2022 2:28 PM

## 2022-11-16 ENCOUNTER — Encounter: Payer: Self-pay | Admitting: Advanced Practice Midwife

## 2022-11-16 ENCOUNTER — Other Ambulatory Visit: Payer: Self-pay | Admitting: Advanced Practice Midwife

## 2022-11-16 DIAGNOSIS — Z3480 Encounter for supervision of other normal pregnancy, unspecified trimester: Secondary | ICD-10-CM

## 2022-11-16 DIAGNOSIS — R8271 Bacteriuria: Secondary | ICD-10-CM | POA: Insufficient documentation

## 2022-11-16 DIAGNOSIS — A599 Trichomoniasis, unspecified: Secondary | ICD-10-CM

## 2022-11-16 DIAGNOSIS — A549 Gonococcal infection, unspecified: Secondary | ICD-10-CM

## 2022-11-16 MED ORDER — METRONIDAZOLE 500 MG PO TABS: 500.0000 mg | ORAL_TABLET | Freq: Two times a day (BID) | ORAL | 0 refills | Status: DC

## 2022-11-16 MED ORDER — AMOXICILLIN-POT CLAVULANATE 875-125 MG PO TABS: 1.0000 | ORAL_TABLET | Freq: Two times a day (BID) | ORAL | 0 refills | Status: DC

## 2022-11-21 ENCOUNTER — Ambulatory Visit (INDEPENDENT_AMBULATORY_CARE_PROVIDER_SITE_OTHER): Payer: Medicaid Other | Admitting: Obstetrics & Gynecology

## 2022-11-21 ENCOUNTER — Ambulatory Visit (INDEPENDENT_AMBULATORY_CARE_PROVIDER_SITE_OTHER): Payer: Medicaid Other

## 2022-11-21 VITALS — BP 106/62 | HR 90 | Wt 162.6 lb

## 2022-11-21 DIAGNOSIS — Z3480 Encounter for supervision of other normal pregnancy, unspecified trimester: Secondary | ICD-10-CM

## 2022-11-21 DIAGNOSIS — O09522 Supervision of elderly multigravida, second trimester: Secondary | ICD-10-CM

## 2022-11-21 DIAGNOSIS — Z363 Encounter for antenatal screening for malformations: Secondary | ICD-10-CM

## 2022-11-21 DIAGNOSIS — Z3A19 19 weeks gestation of pregnancy: Secondary | ICD-10-CM

## 2022-11-21 DIAGNOSIS — Z98891 History of uterine scar from previous surgery: Secondary | ICD-10-CM

## 2022-11-21 DIAGNOSIS — R8271 Bacteriuria: Secondary | ICD-10-CM

## 2022-11-21 NOTE — Progress Notes (Unsigned)
   LOW-RISK PREGNANCY VISIT Patient name: Janet Dixon MRN [id number]  Date of birth: 25-Nov-1985 Chief Complaint:   Routine Prenatal Visit  History of Present Illness:   Janet Dixon is a 37 y.o. [id number] female at 105w3d with an Estimated Date of Delivery: 04/14/23 being seen today for ongoing management of a low-risk pregnancy.   -UTI, trichomonas; patient has not yet taken her antibiotics     11/09/2022    1:55 PM 05/11/2017    2:00 PM 08/29/2016    3:09 PM  Depression screen PHQ 2/9  Decreased Interest 0 1 0  Down, Depressed, Hopeless 0 2 0  PHQ - 2 Score 0 3 0  Altered sleeping 0 2   Tired, decreased energy 0 2   Change in appetite 0 2   Feeling bad or failure about yourself  0 0   Trouble concentrating 0 0   Moving slowly or fidgety/restless 0 0   Suicidal thoughts 0 0   PHQ-9 Score 0 9     Today she reports no complaints. Contractions: Not present. Vag. Bleeding: None.  Movement: Present. denies leaking of fluid. Review of Systems:   Pertinent items are noted in HPI Denies abnormal vaginal discharge w/ itching/odor/irritation, headaches, visual changes, shortness of breath, chest pain, abdominal pain, severe nausea/vomiting, or problems with urination or bowel movements unless otherwise stated above. Pertinent History Reviewed:  Reviewed past medical,surgical, social, obstetrical and family history.  Reviewed problem list, medications and allergies.  Physical Assessment:   Vitals:   11/21/22 1540  BP: 106/62  Pulse: 90  Weight: 162 lb 9.6 oz (73.8 kg)  Body mass index is 31.76 kg/m.        Physical Examination:   General appearance: Well appearing, and in no distress  Mental status: Alert, oriented to person, place, and time  Skin: Warm & dry  Respiratory: Normal respiratory effort, no distress  Abdomen: Soft, gravid, nontender  Pelvic: Cervical exam deferred         Extremities:  No edema  Psych:  mood and affect appropriate  Fetal Status:     Movement:  Present   ,breech,anterior fundal placenta gr 0,normal ovaries,CX 4.2 cm,svp of fluid 5 cm,FHR 148 bpm,EFW 315 g 66%,anatomy complete,no obvious abnormalities  Chaperone: n/a    No results found for this or any previous visit (from the past 24 hour(s)).   Assessment & Plan:  1) Low-risk pregnancy [id number] at [redacted]w[redacted]d with an Estimated Date of Delivery: 04/14/23   -Normal anatomy scan today  -prior C-section x 7 -briefly discussed contraceptive management and if patient is no longer considering a future pregnancy would advise her can to consider a salpingectomy at the time of C-section   Meds: No orders of the defined types were placed in this encounter.  Labs/procedures today: anatomy scan  Plan:  Continue routine obstetrical care  Next visit: prefers in person    Reviewed: Preterm labor symptoms and general obstetric precautions including but not limited to vaginal bleeding, contractions, leaking of fluid and fetal movement were reviewed in detail with the patient.  All questions were answered. Pt has home bp cuff. Check bp weekly, let us know if >140/90.   Follow-up: Return in about 4 weeks (around 12/19/2022) for LROB visit.  No orders of the defined types were placed in this encounter.   Myna Hidalgo, DO Attending Obstetrician & Gynecologist, [facility] for [employer], [facility]

## 2022-11-21 NOTE — Progress Notes (Signed)
[name] 19+3 wks,breech,anterior fundal placenta gr 0,normal ovaries,CX 4.2 cm,svp of fluid 5 cm,FHR 148 bpm,EFW 315 g 66%,anatomy complete,no obvious abnormalities

## 2022-12-07 ENCOUNTER — Other Ambulatory Visit: Payer: Self-pay

## 2022-12-07 ENCOUNTER — Emergency Department (HOSPITAL_COMMUNITY)
Admission: EM | Admit: 2022-12-07 | Discharge: 2022-12-07 | Disposition: A | Discharge status: Home / Self Care | Payer: Medicaid Other | Attending: Emergency Medicine | Admitting: Emergency Medicine

## 2022-12-07 ENCOUNTER — Encounter (HOSPITAL_COMMUNITY): Payer: Self-pay

## 2022-12-07 DIAGNOSIS — O26893 Other specified pregnancy related conditions, third trimester: Secondary | ICD-10-CM | POA: Insufficient documentation

## 2022-12-07 DIAGNOSIS — Z3A23 23 weeks gestation of pregnancy: Secondary | ICD-10-CM | POA: Diagnosis not present

## 2022-12-07 DIAGNOSIS — Z7982 Long term (current) use of aspirin: Secondary | ICD-10-CM | POA: Insufficient documentation

## 2022-12-07 DIAGNOSIS — R1030 Lower abdominal pain, unspecified: Secondary | ICD-10-CM | POA: Diagnosis not present

## 2022-12-07 LAB — CBC WITH DIFFERENTIAL/PLATELET
Abs Immature Granulocytes: 0.05 10*3/uL (ref 0.00–0.07)
Basophils Absolute: 0 10*3/uL (ref 0.0–0.1)
Basophils Relative: 0 %
Eosinophils Absolute: 0.1 10*3/uL (ref 0.0–0.5)
Eosinophils Relative: 1 %
HCT: 31.2 % — ABNORMAL LOW (ref 36.0–46.0)
Hemoglobin: 10.6 g/dL — ABNORMAL LOW (ref 12.0–15.0)
Immature Granulocytes: 1 %
Lymphocytes Relative: 38 %
Lymphs Abs: 3.3 10*3/uL (ref 0.7–4.0)
MCH: 33.2 pg (ref 26.0–34.0)
MCHC: 34 g/dL (ref 30.0–36.0)
MCV: 97.8 fL (ref 80.0–100.0)
Monocytes Absolute: 0.6 10*3/uL (ref 0.1–1.0)
Monocytes Relative: 7 %
Neutro Abs: 4.5 10*3/uL (ref 1.7–7.7)
Neutrophils Relative %: 53 %
Platelets: 305 10*3/uL (ref 150–400)
RBC: 3.19 MIL/uL — ABNORMAL LOW (ref 3.87–5.11)
RDW: 13.7 % (ref 11.5–15.5)
WBC: 8.6 10*3/uL (ref 4.0–10.5)
nRBC: 0 % (ref 0.0–0.2)

## 2022-12-07 LAB — URINALYSIS, ROUTINE W REFLEX MICROSCOPIC
Bilirubin Urine: NEGATIVE
Glucose, UA: NEGATIVE mg/dL
Hgb urine dipstick: NEGATIVE
Ketones, ur: NEGATIVE mg/dL
Nitrite: POSITIVE — AB
Protein, ur: NEGATIVE mg/dL
Specific Gravity, Urine: 1.019 (ref 1.005–1.030)
pH: 6 (ref 5.0–8.0)

## 2022-12-07 LAB — COMPREHENSIVE METABOLIC PANEL
ALT: 9 U/L (ref 0–44)
AST: 13 U/L — ABNORMAL LOW (ref 15–41)
Albumin: 2.8 g/dL — ABNORMAL LOW (ref 3.5–5.0)
Alkaline Phosphatase: 51 U/L (ref 38–126)
Anion gap: 7 (ref 5–15)
BUN: 8 mg/dL (ref 6–20)
CO2: 19 mmol/L — ABNORMAL LOW (ref 22–32)
Calcium: 8.2 mg/dL — ABNORMAL LOW (ref 8.9–10.3)
Chloride: 107 mmol/L (ref 98–111)
Creatinine, Ser: 0.5 mg/dL (ref 0.44–1.00)
GFR, Estimated: >60 mL/min (ref >60)
Glucose, Bld: 81 mg/dL (ref 70–99)
Potassium: 3.7 mmol/L (ref 3.5–5.1)
Sodium: 133 mmol/L — ABNORMAL LOW (ref 135–145)
Total Bilirubin: 0.2 mg/dL — ABNORMAL LOW (ref 0.3–1.2)
Total Protein: 6 g/dL — ABNORMAL LOW (ref 6.5–8.1)

## 2022-12-07 LAB — LIPASE, BLOOD: Lipase: 84 U/L — ABNORMAL HIGH (ref 11–51)

## 2022-12-07 MED ORDER — ACETAMINOPHEN 500 MG PO TABS
1000.0000 mg | ORAL_TABLET | Freq: Once | ORAL | Status: AC
  Administered 2022-12-07: 1000 mg via ORAL
  Filled 2022-12-07: qty 2

## 2022-12-07 MED ORDER — LACTATED RINGERS IV BOLUS
1000.0000 mL | Freq: Once | INTRAVENOUS | Status: AC
  Administered 2022-12-07: 1000 mL via INTRAVENOUS

## 2022-12-07 NOTE — ED Provider Notes (Signed)
Patient care transferred to me.  White blood cell count is normal.  No vaginal symptoms.  No contractions on the monitor and we already documented normal fetal heart tones.  Discussed with OB/GYN, Dr. Ashok Pall.  At this point, would be reasonable to have her follow-up closely with OB/GYN.  No lateralizing symptoms, normal WBC, etc. and thus I think something like appendicitis is less likely.  I think deferring imaging is reasonable, especially since she is feeling better.  Otherwise, she has no urinary symptoms but her UA is positive/contaminated.  Discussed with OB/GYN and they recommend given she is asymptomatic to send for culture as she would need treatment if positive but can hold off on antibiotics for now.  Patient will be discharged home with return precautions.  She is eager for discharge.   Pricilla Loveless, MD 12/07/22 [phone]

## 2022-12-07 NOTE — Progress Notes (Signed)
0602- Received call from [facility] for patient presenting "Pt presents with abdominal pain x3 days. Pt is due Jan 3rd 2025 and is [redacted] weeks pregnant. Pt states that pain is all across belly and states that it does not feel like contractions. Pt denies any vaginal bleeding or complications with pregnancy. Endorses N/V. [facility] "  Patient verbally denies vaginal bleeding, or leaking of fluids, verbally denies any falls. Patient is G8P7. Patient has history of c-sections x 7.   0610- called OB Attending and provided above report. Attending advised can remove [name] as we only need FHR doppler however, can leave on TOCO for 30-40 minutes to assess uterine activity. Attending also advised that [facility] can work up patient and then call us back.   1610- spoke to [facility] staff, advised to leave TOCO in place. TOCO picked up possible contraction(s) at [phone]. Patient verbalized to ED provider that pain is coming about every 15 minutes.   Lovenia Shuck, RN [facility]

## 2022-12-07 NOTE — ED Triage Notes (Addendum)
Pt presents with abdominal pain x3 days. Pt is due Jan 3rd 2025 and is [redacted] weeks pregnant. Pt states that pain is all across belly and states that it does not feel like contractions. Pt denies any vaginal bleeding or complications with pregnancy. Endorses N/V. [facility]

## 2022-12-07 NOTE — ED Notes (Signed)
Spoke with Marissa RN who spoke with Pickins MD. Pt is going to be monitored to make sure pt is not having any contractions. Given the OK for EDP to work pt up for abdominal complaints and to give OB a call back once results are in.

## 2022-12-07 NOTE — ED Notes (Signed)
Lab will do a urine culture on the urine sample they have.

## 2022-12-07 NOTE — Progress Notes (Signed)
Called Dr Donavan Foil and made him aware of patient.  Updated on hx and current complaint.  Dr Donavan Foil agrees with checking a urine to rule out uti ans pylo.  He says that pt may be OB cleared at this time

## 2022-12-07 NOTE — ED Notes (Signed)
OB rapid response called and report given to Hayden, Charity fundraiser. Pt is placed on toco monitor and OB has visualization of pt on monitor. RN to call this RN back when has more information.

## 2022-12-07 NOTE — ED Notes (Signed)
Attempted to get patients urine.  Patient states she doesn't need to use the bathroom at this time.   Patient aware of staff needing urine specimen.

## 2022-12-07 NOTE — Discharge Instructions (Signed)
[facility].  You may take Tylenol for pain if needed.  If you develop fever, new or worsening pain, uncontrolled vomiting, vaginal bleeding or discharge or leakage, or any other new/concerning symptoms then return to the ER or call 911.

## 2022-12-07 NOTE — ED Provider Notes (Signed)
[place] EMERGENCY DEPARTMENT AT [facility] Provider Note   CSN: [id number] Arrival date & time: 12/07/22  4098     History {Add pertinent medical, surgical, social history, OB history to HPI:1} No chief complaint on file.   Janet Dixon is a 37 y.o. female.  HPI     Home Medications Prior to Admission medications   Medication Sig Start Date End Date Taking? Authorizing Provider  albuterol (PROVENTIL HFA;VENTOLIN HFA) 108 (90 Base) MCG/ACT inhaler Inhale 2 puffs into the lungs every 6 (six) hours as needed for wheezing or shortness of breath. Patient not taking: Reported on 08/18/2022 08/29/16   Cyril Mourning A, NP  amoxicillin-clavulanate (AUGMENTIN) 875-125 MG tablet Take 1 tablet by mouth 2 (two) times daily. Patient not taking: Reported on 11/21/2022 11/16/22   Arabella Merles, CNM  aspirin 81 MG chewable tablet Chew 2 tablets (162 mg total) by mouth daily. Patient not taking: Reported on 11/21/2022 11/09/22   Arabella Merles, CNM  metroNIDAZOLE (FLAGYL) 500 MG tablet Take 1 tablet (500 mg total) by mouth 2 (two) times daily. Patient not taking: Reported on 11/21/2022 11/16/22   Arabella Merles, CNM  Prenatal Vit-Fe Fumarate-FA (PRENATAL VITAMINS) 28-0.8 MG TABS Take by mouth.    [provider]  promethazine (PHENERGAN) 12.5 MG tablet Take 1 tablet (12.5 mg total) by mouth every 6 (six) hours as needed. Patient not taking: Reported on 11/09/2022 09/07/22   Cyril Mourning A, NP  potassium chloride (K-DUR) 10 MEQ tablet Take 1 tablet (10 mEq total) by mouth 2 (two) times daily for 3 days. Patient not taking: Reported on 11/08/2018 08/05/18 01/09/19  Durward Parcel, DO      Allergies    Macrobid [nitrofurantoin macrocrystal], Diphenhydramine hcl, Ibuprofen, and Flexeril [cyclobenzaprine hcl]    Review of Systems   Review of Systems  Physical Exam Updated Vital Signs BP 113/65   Pulse 78   Temp 98.4 F (36.9 C) (Oral)   Resp 18   LMP 06/29/2022    SpO2 100%  Physical Exam  ED Results / Procedures / Treatments   Labs (all labs ordered are listed, but only abnormal results are displayed) Labs Reviewed  CBC WITH DIFFERENTIAL/PLATELET - Abnormal; Notable for the following components:      Result Value   RBC 3.19 (*)    Hemoglobin 10.6 (*)    HCT 31.2 (*)    All other components within normal limits  COMPREHENSIVE METABOLIC PANEL - Abnormal; Notable for the following components:   Sodium 133 (*)    CO2 19 (*)    Calcium 8.2 (*)    Total Protein 6.0 (*)    Albumin 2.8 (*)    AST 13 (*)    Total Bilirubin 0.2 (*)    All other components within normal limits  LIPASE, BLOOD - Abnormal; Notable for the following components:   Lipase 84 (*)    All other components within normal limits  URINALYSIS, ROUTINE W REFLEX MICROSCOPIC - Abnormal; Notable for the following components:   APPearance HAZY (*)    Nitrite POSITIVE (*)    Leukocytes,Ua SMALL (*)    Bacteria, UA RARE (*)    All other components within normal limits  URINE CULTURE    EKG None  Radiology No results found.  Procedures Procedures  {Document cardiac monitor, telemetry assessment procedure when appropriate:1}  Medications Ordered in ED Medications  lactated ringers bolus 1,000 mL (0 mLs Intravenous Stopped 12/07/22 0944)  acetaminophen (TYLENOL) tablet  1,000 mg (1,000 mg Oral Given 12/07/22 0454)    ED Course/ Medical Decision Making/ A&P   {   Click here for ABCD2, HEART and other calculatorsREFRESH Note before signing :1}                              Medical Decision Making Amount and/or Complexity of Data Reviewed Labs: ordered.  Risk OTC drugs.   ***  {Document critical care time when appropriate:1} {Document review of labs and clinical decision tools ie heart score, Chads2Vasc2 etc:1}  {Document your independent review of radiology images, and any outside records:1} {Document your discussion with family members, caretakers, and with  consultants:1} {Document social determinants of health affecting pt's care:1} {Document your decision making why or why not admission, treatments were needed:1} Final Clinical Impression(s) / ED Diagnoses Final diagnoses:  Lower abdominal pain    Rx / DC Orders ED Discharge Orders     None

## 2022-12-09 LAB — URINE CULTURE: Culture: >=100000 — AB

## 2022-12-10 ENCOUNTER — Telehealth (HOSPITAL_BASED_OUTPATIENT_CLINIC_OR_DEPARTMENT_OTHER): Payer: Self-pay | Admitting: *Deleted

## 2022-12-10 NOTE — Telephone Encounter (Signed)
Post ED Visit - Positive Culture Follow-up: Unsuccessful Patient Follow-up  Culture assessed and recommendations reviewed by:  [x]  Ivery Quale, Pharm.D. []  Celedonio Miyamoto, Pharm.D., BCPS AQ-ID []  Garvin Fila, Pharm.D., BCPS []  Georgina Pillion, Pharm.D., BCPS []  [place], [address].D., BCPS, AAHIVP []  Estella Husk, Pharm.D., BCPS, AAHIVP []  Sherlynn Carbon, PharmD []  Pollyann Samples, PharmD, BCPS  Positive urine culture  [x]  Patient discharged without antimicrobial prescription and treatment is now indicated []  Organism is resistant to prescribed ED discharge antimicrobial []  Patient with positive blood cultures  Plan: Keflex 500mg  BID x 5 days per Ysidro Evert, PA  Unable to contact patient , letter will be sent to address on file  Patsey Berthold 12/10/2022, 3:18 PM

## 2022-12-10 NOTE — Progress Notes (Signed)
ED Antimicrobial Stewardship Positive Culture Follow Up   SPENSER SORTINO is an 37 y.o. female who presented to [facility] on 12/07/2022 with  ASB in pregnancy.    Recent Results (from the past 720 hour(s))  Urine Culture     Status: Abnormal   Collection Time: 12/07/22  9:43 AM   Specimen: Urine, Clean Catch  Result Value Ref Range Status   Specimen Description   Final    URINE, CLEAN CATCH Performed at [facility], [address] [place], [place] [address]    Special Requests   Final    NONE Performed at [facility], [address] [place], [place] [address]    Culture (A)  Final    >=100,000 COLONIES/mL ESCHERICHIA COLI 50,000 COLONIES/mL DIPHTHEROIDS(CORYNEBACTERIUM SPECIES) Standardized susceptibility testing for this organism is not available. Performed at [facility], [address], [place], [place] [address]    Report Status 12/09/2022 FINAL  Final   Organism ID, Bacteria ESCHERICHIA COLI (A)  Final      Susceptibility   Escherichia coli - MIC*    AMPICILLIN <=2 SENSITIVE Sensitive     CEFAZOLIN <=4 SENSITIVE Sensitive     CEFEPIME <=0.12 SENSITIVE Sensitive     CEFTRIAXONE <=0.25 SENSITIVE Sensitive     CIPROFLOXACIN <=0.25 SENSITIVE Sensitive     GENTAMICIN <=1 SENSITIVE Sensitive     IMIPENEM <=0.25 SENSITIVE Sensitive     NITROFURANTOIN <=16 SENSITIVE Sensitive     TRIMETH/SULFA >=320 RESISTANT Resistant     AMPICILLIN/SULBACTAM <=2 SENSITIVE Sensitive     PIP/TAZO <=4 SENSITIVE Sensitive     * >=100,000 COLONIES/mL ESCHERICHIA COLI    [x]  Patient discharged originally without antimicrobial agent and treatment is now indicated  New antibiotic prescription: Keflex 500mg  Bid x 5 days  ED Provider: Ysidro Evert, PA   Marja Kays 12/10/2022, 8:08 AM Clinical Pharmacist Monday - Friday phone -  [phone] Saturday - Sunday phone - [phone]

## 2022-12-19 ENCOUNTER — Ambulatory Visit (INDEPENDENT_AMBULATORY_CARE_PROVIDER_SITE_OTHER): Payer: Medicaid Other | Admitting: Women's Health

## 2022-12-19 ENCOUNTER — Encounter: Payer: Self-pay | Admitting: Women's Health

## 2022-12-19 VITALS — BP 99/62 | HR 90 | Wt 161.0 lb

## 2022-12-19 DIAGNOSIS — R8271 Bacteriuria: Secondary | ICD-10-CM

## 2022-12-19 DIAGNOSIS — Z3A23 23 weeks gestation of pregnancy: Secondary | ICD-10-CM

## 2022-12-19 DIAGNOSIS — R87619 Unspecified abnormal cytological findings in specimens from cervix uteri: Secondary | ICD-10-CM | POA: Insufficient documentation

## 2022-12-19 DIAGNOSIS — Z3482 Encounter for supervision of other normal pregnancy, second trimester: Secondary | ICD-10-CM

## 2022-12-19 DIAGNOSIS — A599 Trichomoniasis, unspecified: Secondary | ICD-10-CM

## 2022-12-19 DIAGNOSIS — Z3480 Encounter for supervision of other normal pregnancy, unspecified trimester: Secondary | ICD-10-CM

## 2022-12-19 MED ORDER — PANTOPRAZOLE SODIUM 20 MG PO TBEC: 20.0000 mg | DELAYED_RELEASE_TABLET | Freq: Every day | ORAL | 3 refills | Status: DC

## 2022-12-19 MED ORDER — ALBUTEROL SULFATE HFA 108 (90 BASE) MCG/ACT IN AERS: 2.0000 | INHALATION_SPRAY | Freq: Four times a day (QID) | RESPIRATORY_TRACT | 1 refills | Status: AC | PRN

## 2022-12-19 MED ORDER — AMOXICILLIN-POT CLAVULANATE 875-125 MG PO TABS: 1.0000 | ORAL_TABLET | Freq: Two times a day (BID) | ORAL | 0 refills | Status: DC

## 2022-12-19 MED ORDER — METRONIDAZOLE 500 MG PO TABS
500.0000 mg | ORAL_TABLET | Freq: Two times a day (BID) | ORAL | 0 refills | Status: DC
Start: 2022-12-19 — End: 2023-01-23

## 2022-12-19 NOTE — Patient Instructions (Signed)
Janet Dixon, thank you for choosing our office today! We appreciate the opportunity to meet your healthcare needs. You may receive a short survey by mail, e-mail, or through [employer]. If you are happy with your care we would appreciate if you could take just a few minutes to complete the survey questions. We read all of your comments and take your feedback very seriously. Thank you again for choosing our office.  Center for [employer] Team at [facility]  [facility] at [facility] ([address] [place], [place] [address]) Entrance C, located off of [address] Free 24/7 valet parking   You will have your sugar test next visit.  Please do not eat or drink anything after midnight the night before you come, not even water.  You will be here for at least two hours.  Please make an appointment online for the bloodwork at [email or web address] for 8:00am (or as close to this as possible). Make sure you select the [facility] service center.   CLASSES: Go to [email or web address] to register for classes (childbirth, breastfeeding, waterbirth, infant CPR, daddy bootcamp, etc.)  Call the office [phone]) or go to [facility] if: You begin to have strong, frequent contractions Your water breaks.  Sometimes it is a big gush of fluid, sometimes it is just a trickle that keeps getting your panties wet or running down your legs You have vaginal bleeding.  It is normal to have a small amount of spotting if your cervix was checked.  You don't feel your baby moving like normal.  If you don't, get you something to eat and drink and lay down and focus on feeling your baby move.   If your baby is still not moving like normal, you should call the office or go to [facility].  Call the office [phone]) or go to [facility] hospital for these signs of pre-eclampsia: Severe headache that does not go away with Tylenol Visual changes- seeing spots, double, blurred vision Pain under your right breast or upper  abdomen that does not go away with Tums or heartburn medicine Nausea and/or vomiting Severe swelling in your hands, feet, and face    [place] Pediatricians/Family Doctors [facility]): [address] Colette Ribas, [phone]           [facility]: [address], [phone]                [facility]): [address], [phone]  [facility] Department: [address], [place], [phone]    [facility] Pediatricians/Family Doctors [facility]): [address], [phone] [facility]: [address] [place], [phone] [facility]: [address], [phone]  [facility] Doctors  [facility]): [phone] [facility]: [address], [phone]   [facility] Doctors [facility]: [address] [phone]  [facility] Doctors  [facility]: [phone], [phone]  Home Blood Pressure Monitoring for Patients   Your provider has recommended that you check your blood pressure (BP) at least once a week at home. If you do not have a blood pressure cuff at home, one will be provided for you. Contact your provider if you have not received your monitor within 1 week.   Helpful Tips for Accurate Home Blood Pressure Checks  Don't smoke, exercise, or drink caffeine 30 minutes before checking  your BP Use the restroom before checking your BP (a full bladder can raise your pressure) Relax in a comfortable upright chair Feet on the ground Left arm resting comfortably on a flat surface at the level of your heart Legs uncrossed Back supported Sit quietly and don't talk Place the cuff on your bare arm Adjust snuggly, so that only two fingertips can fit between your skin and the top of the cuff Check 2 readings separated by at least one  minute Keep a log of your BP readings For a visual, please reference this diagram: http://ccnc.care/bpdiagram  Provider Name: [facility]     Phone: [phone]  Zone 1: ALL CLEAR  Continue to monitor your symptoms:  BP reading is less than 140 (top number) or less than 90 (bottom number)  No right upper stomach pain No headaches or seeing spots No feeling nauseated or throwing up No swelling in face and hands  Zone 2: CAUTION Call your doctor's office for any of the following:  BP reading is greater than 140 (top number) or greater than 90 (bottom number)  Stomach pain under your ribs in the middle or right side Headaches or seeing spots Feeling nauseated or throwing up Swelling in face and hands  Zone 3: EMERGENCY  Seek immediate medical care if you have any of the following:  BP reading is greater than160 (top number) or greater than 110 (bottom number) Severe headaches not improving with Tylenol Serious difficulty catching your breath Any worsening symptoms from Zone 2   Second Trimester of Pregnancy The second trimester is from week 13 through week 28, months 4 through 6. The second trimester is often a time when you feel your best. Your body has also adjusted to being pregnant, and you begin to feel better physically. Usually, morning sickness has lessened or quit completely, you may have more energy, and you may have an increase in appetite. The second trimester is also a time when the fetus is growing rapidly. At the end of the sixth month, the fetus is about 9 inches long and weighs about 1 pounds. You will likely begin to feel the baby move (quickening) between 18 and 20 weeks of the pregnancy. BODY CHANGES Your body goes through many changes during pregnancy. The changes vary from woman to woman.  Your weight will continue to increase. You will notice your lower abdomen bulging out. You may begin to get stretch marks on your hips, abdomen, and breasts. You may  develop headaches that can be relieved by medicines approved by your health care provider. You may urinate more often because the fetus is pressing on your bladder. You may develop or continue to have heartburn as a result of your pregnancy. You may develop constipation because certain hormones are causing the muscles that push waste through your intestines to slow down. You may develop hemorrhoids or swollen, bulging veins (varicose veins). You may have back pain because of the weight gain and pregnancy hormones relaxing your joints between the bones in your pelvis and as a result of a shift in weight and the muscles that support your balance. Your breasts will continue to grow and be tender. Your gums may bleed and may be sensitive to brushing and flossing. Dark spots or blotches (chloasma, mask of pregnancy) may develop on your face. This will likely fade after the baby is born. A dark line from your belly button to the pubic area (linea nigra) may appear. This will likely fade after the  baby is born. You may have changes in your hair. These can include thickening of your hair, rapid growth, and changes in texture. Some women also have hair loss during or after pregnancy, or hair that feels dry or thin. Your hair will most likely return to normal after your baby is born. WHAT TO EXPECT AT YOUR PRENATAL VISITS During a routine prenatal visit: You will be weighed to make sure you and the fetus are growing normally. Your blood pressure will be taken. Your abdomen will be measured to track your baby's growth. The fetal heartbeat will be listened to. Any test results from the previous visit will be discussed. Your health care provider may ask you: How you are feeling. If you are feeling the baby move. If you have had any abnormal symptoms, such as leaking fluid, bleeding, severe headaches, or abdominal cramping. If you have any questions. Other tests that may be performed during your second  trimester include: Blood tests that check for: Low iron levels (anemia). Gestational diabetes (between 24 and 28 weeks). Rh antibodies. Urine tests to check for infections, diabetes, or protein in the urine. An ultrasound to confirm the proper growth and development of the baby. An amniocentesis to check for possible genetic problems. Fetal screens for spina bifida and Down syndrome. HOME CARE INSTRUCTIONS  Avoid all smoking, herbs, alcohol, and unprescribed drugs. These chemicals affect the formation and growth of the baby. Follow your health care provider's instructions regarding medicine use. There are medicines that are either safe or unsafe to take during pregnancy. Exercise only as directed by your health care provider. Experiencing uterine cramps is a good sign to stop exercising. Continue to eat regular, healthy meals. Wear a good support bra for breast tenderness. Do not use hot tubs, steam rooms, or saunas. Wear your seat belt at all times when driving. Avoid raw meat, uncooked cheese, cat litter boxes, and soil used by cats. These carry germs that can cause birth defects in the baby. Take your prenatal vitamins. Try taking a stool softener (if your health care provider approves) if you develop constipation. Eat more high-fiber foods, such as fresh vegetables or fruit and whole grains. Drink plenty of fluids to keep your urine clear or pale yellow. Take warm sitz baths to soothe any pain or discomfort caused by hemorrhoids. Use hemorrhoid cream if your health care provider approves. If you develop varicose veins, wear support hose. Elevate your feet for 15 minutes, 3-4 times a day. Limit salt in your diet. Avoid heavy lifting, wear low heel shoes, and practice good posture. Rest with your legs elevated if you have leg cramps or low back pain. Visit your dentist if you have not gone yet during your pregnancy. Use a soft toothbrush to brush your teeth and be gentle when you floss. A  sexual relationship may be continued unless your health care provider directs you otherwise. Continue to go to all your prenatal visits as directed by your health care provider. SEEK MEDICAL CARE IF:  You have dizziness. You have mild pelvic cramps, pelvic pressure, or nagging pain in the abdominal area. You have persistent nausea, vomiting, or diarrhea. You have a bad smelling vaginal discharge. You have pain with urination. SEEK IMMEDIATE MEDICAL CARE IF:  You have a fever. You are leaking fluid from your vagina. You have spotting or bleeding from your vagina. You have severe abdominal cramping or pain. You have rapid weight gain or loss. You have shortness of breath with chest pain. You  notice sudden or extreme swelling of your face, hands, ankles, feet, or legs. You have not felt your baby move in over an hour. You have severe headaches that do not go away with medicine. You have vision changes. Document Released: 03/22/2001 Document Revised: 04/02/2013 Document Reviewed: 05/29/2012 [facility] Patient Information 2015 [place], [place]. This information is not intended to replace advice given to you by your health care provider. Make sure you discuss any questions you have with your health care provider.

## 2022-12-19 NOTE — Progress Notes (Signed)
LOW-RISK PREGNANCY VISIT Patient name: Janet Dixon MRN [id number]  Date of birth: November 17, 1985 Chief Complaint:   Routine Prenatal Visit (Need some thing heart burn. Refill ventolin  inhaler)  History of Present Illness:   Janet Dixon is a 37 y.o. [id number] female at [redacted]w[redacted]d with an Estimated Date of Delivery: 04/14/23 being seen today for ongoing management of a low-risk pregnancy.   Today she reports  reflux, requests rx. Needs refill on inhaler. Never took augmentin for UTI or flagyl for +trich (d/t insurance issues), requests both be resent . Contractions: Not present.  .  Movement: Present. denies leaking of fluid.     11/09/2022    1:55 PM 05/11/2017    2:00 PM 08/29/2016    3:09 PM  Depression screen PHQ 2/9  Decreased Interest 0 1 0  Down, Depressed, Hopeless 0 2 0  PHQ - 2 Score 0 3 0  Altered sleeping 0 2   Tired, decreased energy 0 2   Change in appetite 0 2   Feeling bad or failure about yourself  0 0   Trouble concentrating 0 0   Moving slowly or fidgety/restless 0 0   Suicidal thoughts 0 0   PHQ-9 Score 0 9         11/09/2022    1:56 PM  GAD 7 : Generalized Anxiety Score  Nervous, Anxious, on Edge 0  Control/stop worrying 0  Worry too much - different things 0  Trouble relaxing 0  Restless 0  Easily annoyed or irritable 0  Afraid - awful might happen 0  Total GAD 7 Score 0      Review of Systems:   Pertinent items are noted in HPI Denies abnormal vaginal discharge w/ itching/odor/irritation, headaches, visual changes, shortness of breath, chest pain, abdominal pain, severe nausea/vomiting, or problems with urination or bowel movements unless otherwise stated above. Pertinent History Reviewed:  Reviewed past medical,surgical, social, obstetrical and family history.  Reviewed problem list, medications and allergies. Physical Assessment:   Vitals:   12/19/22 1601  BP: 99/62  Pulse: 90  Weight: 161 lb (73 kg)  Body mass index is 31.44 kg/m.         Physical Examination:   General appearance: Well appearing, and in no distress  Mental status: Alert, oriented to person, place, and time  Skin: Warm & dry  Cardiovascular: Normal heart rate noted  Respiratory: Normal respiratory effort, no distress  Abdomen: Soft, gravid, nontender  Pelvic: Cervical exam deferred         Extremities: Edema: None  Fetal Status: Fetal Heart Rate (bpm): 150 Fundal Height: 24 cm Movement: Present    Chaperone: N/A   No results found for this or any previous visit (from the past 24 hour(s)).  Assessment & Plan:  1) Low-risk pregnancy [id number] at [redacted]w[redacted]d with an Estimated Date of Delivery: 04/14/23   2) Reflux, rx protonix  3) Asthma> refilled inhaler per reqeust  4) ASB> didn't take meds from 8/7 d/t insurance issues, resent augmentin today  5) Recent +trichomonas> didn't take meds from 8/7 d/t insurance issues, resent metronidazole today. Called in metronidazole 2gm po x 1 w/ 0RF for partner Jeanmarie Plant DOB 10/18/71, allergy-amoxicillin to [facility]. No sex x 7d from last day she is finished w/ her meds  6) Prev c/s x7> for RCS   Meds:  Meds ordered this encounter  Medications   pantoprazole (PROTONIX) 20 MG tablet    Sig: Take 1 tablet (20 mg  total) by mouth daily.    Dispense:  30 tablet    Refill:  3   albuterol (VENTOLIN HFA) 108 (90 Base) MCG/ACT inhaler    Sig: Inhale 2 puffs into the lungs every 6 (six) hours as needed for wheezing or shortness of breath.    Dispense:  1 each    Refill:  1   amoxicillin-clavulanate (AUGMENTIN) 875-125 MG tablet    Sig: Take 1 tablet by mouth 2 (two) times daily.    Dispense:  14 tablet    Refill:  0   metroNIDAZOLE (FLAGYL) 500 MG tablet    Sig: Take 1 tablet (500 mg total) by mouth 2 (two) times daily.    Dispense:  14 tablet    Refill:  0   Labs/procedures today: none  Plan:  Continue routine obstetrical care  Next visit: prefers will be in person for pn2     Reviewed: Preterm labor  symptoms and general obstetric precautions including but not limited to vaginal bleeding, contractions, leaking of fluid and fetal movement were reviewed in detail with the patient.  All questions were answered. Does have home bp cuff. Office bp cuff given: not applicable. Check bp weekly, let us know if consistently >140 and/or >90.  Follow-up: Return in about 4 weeks (around 01/16/2023) for LROB, PN2, CNM, in person.  Future Appointments  Date Time Provider Department Center  01/18/2023  8:30 AM [facility]  01/18/2023 10:10 AM Arabella Merles, CNM [facility]    No orders of the defined types were placed in this encounter.  Cheral Marker CNM, [facility] 12/19/2022 4:39 PM

## 2023-01-16 ENCOUNTER — Other Ambulatory Visit: Payer: Medicaid Other

## 2023-01-18 ENCOUNTER — Other Ambulatory Visit: Payer: Medicaid Other

## 2023-01-18 ENCOUNTER — Encounter: Payer: Medicaid Other | Admitting: Advanced Practice Midwife

## 2023-01-19 ENCOUNTER — Other Ambulatory Visit: Payer: Medicaid Other

## 2023-01-19 ENCOUNTER — Ambulatory Visit (INDEPENDENT_AMBULATORY_CARE_PROVIDER_SITE_OTHER): Payer: Medicaid Other | Admitting: Obstetrics & Gynecology

## 2023-01-19 VITALS — BP 111/60 | HR 91 | Wt 166.6 lb

## 2023-01-19 DIAGNOSIS — Z8619 Personal history of other infectious and parasitic diseases: Secondary | ICD-10-CM

## 2023-01-19 DIAGNOSIS — Z131 Encounter for screening for diabetes mellitus: Secondary | ICD-10-CM

## 2023-01-19 DIAGNOSIS — Z3480 Encounter for supervision of other normal pregnancy, unspecified trimester: Secondary | ICD-10-CM

## 2023-01-19 DIAGNOSIS — Z98891 History of uterine scar from previous surgery: Secondary | ICD-10-CM

## 2023-01-19 DIAGNOSIS — F1911 Other psychoactive substance abuse, in remission: Secondary | ICD-10-CM

## 2023-01-19 DIAGNOSIS — F1991 Other psychoactive substance use, unspecified, in remission: Secondary | ICD-10-CM

## 2023-01-19 DIAGNOSIS — Z3A27 27 weeks gestation of pregnancy: Secondary | ICD-10-CM

## 2023-01-19 DIAGNOSIS — Z87440 Personal history of urinary (tract) infections: Secondary | ICD-10-CM

## 2023-01-19 NOTE — Progress Notes (Signed)
HIGH-RISK PREGNANCY VISIT Patient name: Janet Dixon MRN [id number]  Date of birth: 1985/07/19 Chief Complaint:   Routine Prenatal Visit  History of Present Illness:   SHAYE ELLING is a 37 y.o. [id number] female at [redacted]w[redacted]d with an Estimated Date of Delivery: 04/14/23 being seen today for ongoing management of a high-risk pregnancy complicated by:  -Prior C-section x 7 -Trich and Bacteruria, s/p treatment, []  TOC today  Pt notes prior usage of marijuana and smoking crack.  States she has stopped and is now part of a program to help her quit.  States that she wanted to be honest to make sure everything was ok with the baby.   Contractions: Not present. Vag. Bleeding: None.  Movement: Present. denies leaking of fluid.      11/09/2022    1:55 PM 05/11/2017    2:00 PM 08/29/2016    3:09 PM  Depression screen PHQ 2/9  Decreased Interest 0 1 0  Down, Depressed, Hopeless 0 2 0  PHQ - 2 Score 0 3 0  Altered sleeping 0 2   Tired, decreased energy 0 2   Change in appetite 0 2   Feeling bad or failure about yourself  0 0   Trouble concentrating 0 0   Moving slowly or fidgety/restless 0 0   Suicidal thoughts 0 0   PHQ-9 Score 0 9      Current Outpatient Medications  Medication Instructions   albuterol (VENTOLIN HFA) 108 (90 Base) MCG/ACT inhaler 2 puffs, Inhalation, Every 6 hours PRN   amoxicillin-clavulanate (AUGMENTIN) 875-125 MG tablet 1 tablet, Oral, 2 times daily   aspirin 162 mg, Oral, Daily   metroNIDAZOLE (FLAGYL) 500 mg, Oral, 2 times daily   pantoprazole (PROTONIX) 20 mg, Oral, Daily   Prenatal MV & Min w/FA-DHA (PRENATAL GUMMIES PO) Oral, Daily   Prenatal Vit-Fe Fumarate-FA (PRENATAL VITAMINS) 28-0.8 MG TABS Take by mouth.   promethazine (PHENERGAN) 12.5 mg, Oral, Every 6 hours PRN     Review of Systems:   Pertinent items are noted in HPI Denies abnormal vaginal discharge w/ itching/odor/irritation, headaches, visual changes, shortness of breath, chest pain, abdominal  pain, severe nausea/vomiting, or problems with urination or bowel movements unless otherwise stated above. Pertinent History Reviewed:  Reviewed past medical,surgical, social, obstetrical and family history.  Reviewed problem list, medications and allergies. Physical Assessment:   Vitals:   01/19/23 0924  BP: 111/60  Pulse: 91  Weight: 166 lb 9.6 oz (75.6 kg)  Body mass index is 32.54 kg/m.           Physical Examination:   General appearance: alert, well appearing, and in no distress  Mental status: normal mood, behavior, speech, dress, motor activity, and thought processes  Skin: warm & dry   Extremities:      Cardiovascular: normal heart rate noted  Respiratory: normal respiratory effort, no distress  Abdomen: gravid, soft, non-tender  Pelvic: Cervical exam deferred         Fetal Status: Fetal Heart Rate (bpm): 145 Fundal Height: 27 cm Movement: Present    Fetal Surveillance Testing today: doppler   Chaperone: N/A    No results found for this or any previous visit (from the past 24 hour(s)).   Assessment & Plan:  High-risk pregnancy: [id number] at [redacted]w[redacted]d with an Estimated Date of Delivery: 04/14/23   1) h/o drug use -plan for Utox today -NAS referral created -reports no current use  2) prior C-section x 7  3) Trich, Bacteruria []  TOC  today  Meds: No orders of the defined types were placed in this encounter.   Labs/procedures today: Bonnita Levan, UCx  Treatment Plan:  routine OB care, []  reschedule PN2  Reviewed: Preterm labor symptoms and general obstetric precautions including but not limited to vaginal bleeding, contractions, leaking of fluid and fetal movement were reviewed in detail with the patient.  All questions were answered. Pt has home bp cuff. Check bp weekly, let us know if >140/90.   Follow-up: Return in about 3 weeks (around 02/09/2023) for HROB visit, needs to reschedule PN2 at next available.   Future Appointments  Date Time Provider Department  Center  02/09/2023  9:05 AM [facility]  02/09/2023 10:10 AM Myna Hidalgo, DO [facility]    Orders Placed This Encounter  Procedures   Urine Culture   Trichomonas vaginalis, RNA   ToxASSURE Select 13 (MW), Urine   Amb referral to Neonatal Abstinence Syndrome (NAS) Team    Myna Hidalgo, DO Attending Obstetrician & Gynecologist, Faculty Practice Center for [facility], [facility]

## 2023-01-20 ENCOUNTER — Other Ambulatory Visit: Payer: Medicaid Other

## 2023-01-22 LAB — TRICHOMONAS VAGINALIS, PROBE AMP: Trich vag by NAA: POSITIVE — AB

## 2023-01-23 ENCOUNTER — Other Ambulatory Visit: Payer: Self-pay | Admitting: Obstetrics & Gynecology

## 2023-01-23 DIAGNOSIS — A599 Trichomoniasis, unspecified: Secondary | ICD-10-CM

## 2023-01-23 LAB — URINE CULTURE

## 2023-01-23 MED ORDER — CEFADROXIL 500 MG PO CAPS: 500.0000 mg | ORAL_CAPSULE | Freq: Two times a day (BID) | ORAL | 0 refills | Status: AC

## 2023-01-23 MED ORDER — METRONIDAZOLE 500 MG PO TABS
2000.0000 mg | ORAL_TABLET | Freq: Once | ORAL | 1 refills | Status: AC
Start: 2023-01-23 — End: 2023-01-23

## 2023-01-25 LAB — TOXASSURE SELECT 13 (MW), URINE

## 2023-02-02 ENCOUNTER — Telehealth: Payer: Self-pay | Admitting: Pediatrics

## 2023-02-02 NOTE — Telephone Encounter (Signed)
NAS telephone consult attempted without answer.  HIPAA compliant message left requesting a return call.  Patient can reach the consult team at [phone].

## 2023-02-09 ENCOUNTER — Encounter: Payer: Medicaid Other | Admitting: Obstetrics & Gynecology

## 2023-02-09 ENCOUNTER — Other Ambulatory Visit: Payer: Medicaid Other

## 2023-02-23 ENCOUNTER — Encounter: Payer: Self-pay | Admitting: Advanced Practice Midwife

## 2023-02-23 ENCOUNTER — Other Ambulatory Visit: Payer: Medicaid Other

## 2023-02-23 ENCOUNTER — Ambulatory Visit (INDEPENDENT_AMBULATORY_CARE_PROVIDER_SITE_OTHER): Payer: Medicaid Other | Admitting: Advanced Practice Midwife

## 2023-02-23 VITALS — BP 100/58 | HR 99 | Wt 163.0 lb

## 2023-02-23 DIAGNOSIS — O0993 Supervision of high risk pregnancy, unspecified, third trimester: Secondary | ICD-10-CM

## 2023-02-23 DIAGNOSIS — Z23 Encounter for immunization: Secondary | ICD-10-CM | POA: Diagnosis not present

## 2023-02-23 DIAGNOSIS — Z1332 Encounter for screening for maternal depression: Secondary | ICD-10-CM

## 2023-02-23 DIAGNOSIS — Z8619 Personal history of other infectious and parasitic diseases: Secondary | ICD-10-CM

## 2023-02-23 DIAGNOSIS — A599 Trichomoniasis, unspecified: Secondary | ICD-10-CM

## 2023-02-23 DIAGNOSIS — F1410 Cocaine abuse, uncomplicated: Secondary | ICD-10-CM

## 2023-02-23 DIAGNOSIS — O09523 Supervision of elderly multigravida, third trimester: Secondary | ICD-10-CM

## 2023-02-23 DIAGNOSIS — R8271 Bacteriuria: Secondary | ICD-10-CM

## 2023-02-23 DIAGNOSIS — Z87440 Personal history of urinary (tract) infections: Secondary | ICD-10-CM

## 2023-02-23 DIAGNOSIS — G40909 Epilepsy, unspecified, not intractable, without status epilepticus: Secondary | ICD-10-CM

## 2023-02-23 DIAGNOSIS — Z3A32 32 weeks gestation of pregnancy: Secondary | ICD-10-CM | POA: Diagnosis not present

## 2023-02-23 DIAGNOSIS — F1991 Other psychoactive substance use, unspecified, in remission: Secondary | ICD-10-CM

## 2023-02-23 NOTE — Patient Instructions (Addendum)
Janet Dixon, I greatly value your feedback.  If you receive a survey following your visit with [name] today, we appreciate you taking the time to fill it out.  Thanks, Janet Beams, DNP, CNM  [facility] HAS MOVED!!! It is now [facility] ([address] [place], [place] [address]) Entrance located off of [address] Free 24/7 valet parking   Go to [email or web address] to register for FREE online childbirth classes    Call the office [phone]) or go to [facility] if: You begin to have strong, frequent contractions Your water breaks.  Sometimes it is a big gush of fluid, sometimes it is just a trickle that keeps getting your panties wet or running down your legs You have vaginal bleeding.  It is normal to have a small amount of spotting if your cervix was checked.  You don't feel your baby moving like normal.  If you don't, get you something to eat and drink and lay down and focus on feeling your baby move.  You should feel at least 10 movements in 2 hours.  If you don't, you should call the office or go to [facility].   Home Blood Pressure Monitoring for Patients   Your provider has recommended that you check your blood pressure (BP) at least once a week at home. If you do not have a blood pressure cuff at home, one will be provided for you. Contact your provider if you have not received your monitor within 1 week.   Helpful Tips for Accurate Home Blood Pressure Checks  Don't smoke, exercise, or drink caffeine 30 minutes before checking your BP Use the restroom before checking your BP (a full bladder can raise your pressure) Relax in a comfortable upright chair Feet on the ground Left arm resting comfortably on a flat surface at the level of your heart Legs uncrossed Back supported Sit quietly and don't talk Place the cuff on your bare arm Adjust snuggly, so that only two fingertips can fit between your skin and the top  of the cuff Check 2 readings separated by at least one minute Keep a log of your BP readings For a visual, please reference this diagram: http://ccnc.care/bpdiagram  Provider Name: [facility]     Phone: [phone]  Zone 1: ALL CLEAR  Continue to monitor your symptoms:  BP reading is less than 140 (top number) or less than 90 (bottom number)  No right upper stomach pain No headaches or seeing spots No feeling nauseated or throwing up No swelling in face and hands  Zone 2: CAUTION Call your doctor's office for any of the following:  BP reading is greater than 140 (top number) or greater than 90 (bottom number)  Stomach pain under your ribs in the middle or right side Headaches or seeing spots Feeling nauseated or throwing up Swelling in face and hands  Zone 3: EMERGENCY  Seek immediate medical care if you have any of the following:  BP reading is greater than160 (top number) or greater than 110 (bottom number) Severe headaches not improving with Tylenol Serious difficulty catching your breath Any worsening symptoms from Zone 2    For your lower back pain you may: Purchase a pregnancy/maternity support belt from [employer], [employer], [employer], [employer], etc and wear it while you are up and about Take warm baths Use a heating pad to your lower back for no longer than 20 minutes at a time, and do  not place near abdomen Take tylenol as needed. Please follow directions on the bottle Kinesiology tape (can get from sporting goods store), google how to tape belly for pregnancy

## 2023-02-23 NOTE — Progress Notes (Signed)
HIGH-RISK PREGNANCY VISIT Patient name: Janet Dixon MRN [id number]  Date of birth: [birth year] Chief Complaint:   Routine Prenatal Visit  History of Present Illness:   Janet Dixon is a 37 y.o. W4X3244 female at [redacted]w[redacted]d with an Estimated Date of Delivery: 04/14/23 being seen today for ongoing management of a high-risk pregnancy complicated by polysubstance abuse Cocaine/THC.    Today she reports normal pregnancy complaints, LBP. Contractions: Not present. Vag. Bleeding: None.  Movement: Present. denies leaking of fluid.      02/23/2023    9:47 AM 11/09/2022    1:55 PM 05/11/2017    2:00 PM 08/29/2016    3:09 PM  Depression screen PHQ 2/9  Decreased Interest 0 0 1 0  Down, Depressed, Hopeless 0 0 2 0  PHQ - 2 Score 0 0 3 0  Altered sleeping 0 0 2   Tired, decreased energy 1 0 2   Change in appetite 0 0 2   Feeling bad or failure about yourself  0 0 0   Trouble concentrating 0 0 0   Moving slowly or fidgety/restless 0 0 0   Suicidal thoughts 0 0 0   PHQ-9 Score 1 0 9         02/23/2023    9:48 AM 11/09/2022    1:56 PM  GAD 7 : Generalized Anxiety Score  Nervous, Anxious, on Edge 0 0  Control/stop worrying 0 0  Worry too much - different things 0 0  Trouble relaxing 0 0  Restless 0 0  Easily annoyed or irritable 0 0  Afraid - awful might happen 0 0  Total GAD 7 Score 0 0     Review of Systems:   Pertinent items are noted in HPI Denies abnormal vaginal discharge w/ itching/odor/irritation, headaches, visual changes, shortness of breath, chest pain, abdominal pain, severe nausea/vomiting, or problems with urination or bowel movements unless otherwise stated above. Pertinent History Reviewed:  Reviewed past medical,surgical, social, obstetrical and family history.  Reviewed problem list, medications and allergies. Physical Assessment:   Vitals:   02/23/23 0936  BP: (!) 100/58  Pulse: 99  Weight: 163 lb (73.9 kg)  Body mass index is 31.83 kg/m.            Physical Examination:   General appearance: alert, well appearing, and in no distress  Mental status: alert, oriented to person, place, and time  Skin: warm & dry   Extremities:      Cardiovascular: normal heart rate noted  Respiratory: normal respiratory effort, no distress  Abdomen: gravid, soft, non-tender  Pelvic: Cervical exam deferred         Chaperone: N/A    Fetal Status: Fetal Heart Rate (bpm): 142 Fundal Height: 31 cm Movement: Present    Fetal Surveillance Testing today: doppler     No results found for this or any previous visit (from the past 24 hour(s)).  Assessment & Plan:  High-risk pregnancy: [id number] at [redacted]w[redacted]d with an Estimated Date of Delivery: 04/14/23   1. [redacted] weeks gestation of pregnancy  - Urine Culture - Trichomonas vaginalis, RNA  2. History of trichomoniasis TOC - Trichomonas vaginalis, RNA  3. History of UTI TOC - Urine Culture  4. Multigravida of advanced maternal age in third trimester   5. History of illicit drug use Cocaine/THC + 01/23/23  6. Seizure disorder (HCC) Last seizure 2015   7.Trichomoniasis Pt and partner took meds, TOC today  8. GBS bacteriuria Treated  Meds: No orders of the defined types were placed in this encounter.   Orders:  Orders Placed This Encounter  Procedures   Urine Culture   Trichomonas vaginalis, RNA    Labs/procedures today: PN2, Tdap   Reviewed: Preterm labor symptoms and general obstetric precautions including but not limited to vaginal bleeding, contractions, leaking of fluid and fetal movement were reviewed in detail with the patient.  All questions were answered. Does have home bp cuff. Office bp cuff given: not applicable. Check bp weekly, let us know if consistently >140 and/or >90.  Follow-up: Return in about 1 week (around 03/02/2023) for HROB w/DR. Eure to schedule cs.   No future appointments.  Orders Placed This Encounter  Procedures   Urine Culture   Trichomonas vaginalis,  RNA   Jacklyn Shell , DNP, CNM [facility] 02/23/2023 10:26 AM

## 2023-02-23 NOTE — Addendum Note (Signed)
Addended by: Annamarie Dawley on: 02/23/2023 10:29 AM   Modules accepted: Orders

## 2023-02-24 LAB — CBC
Hematocrit: 33.7 % — ABNORMAL LOW (ref 34.0–46.6)
Hemoglobin: 11 g/dL — ABNORMAL LOW (ref 11.1–15.9)
MCH: 32.8 pg (ref 26.6–33.0)
MCHC: 32.6 g/dL (ref 31.5–35.7)
MCV: 101 fL — ABNORMAL HIGH (ref 79–97)
Platelets: 292 10*3/uL (ref 150–450)
RBC: 3.35 x10E6/uL — ABNORMAL LOW (ref 3.77–5.28)
RDW: 12.3 % (ref 11.7–15.4)
WBC: 8.7 10*3/uL (ref 3.4–10.8)

## 2023-02-24 LAB — ANTIBODY SCREEN: Antibody Screen: NEGATIVE

## 2023-02-24 LAB — GLUCOSE TOLERANCE, 2 HOURS W/ 1HR
Glucose, 1 hour: 81 mg/dL (ref 70–179)
Glucose, 2 hour: 73 mg/dL (ref 70–152)
Glucose, Fasting: 72 mg/dL (ref 70–91)

## 2023-02-24 LAB — HIV ANTIBODY (ROUTINE TESTING W REFLEX): HIV Screen 4th Generation wRfx: NONREACTIVE

## 2023-02-24 LAB — RPR: RPR Ser Ql: NONREACTIVE

## 2023-02-26 LAB — URINE CULTURE

## 2023-02-26 LAB — TRICHOMONAS VAGINALIS, PROBE AMP: Trich vag by NAA: POSITIVE — AB

## 2023-02-28 ENCOUNTER — Telehealth: Payer: Medicaid Other | Admitting: Family Medicine

## 2023-02-28 DIAGNOSIS — O99613 Diseases of the digestive system complicating pregnancy, third trimester: Secondary | ICD-10-CM

## 2023-02-28 NOTE — Progress Notes (Signed)
For the safety of you and your child, I recommend a face to face office visit with a health care provider.  Many mothers need to take medicines during their pregnancy and while nursing.  Almost all medicines pass into the breast milk in small quantities.  Most are generally considered safe for a mother to take but some medicines must be avoided.  After reviewing your E-Visit request, I recommend that you consult your OB/GYN or pediatrician for medical advice in relation to your condition and prescription medications while pregnant or breastfeeding.  NOTE:  There will be NO CHARGE for this eVisit  If you are having a true medical emergency please call 911.    For an urgent face to face visit, [place] has six urgent care centers for your convenience:     [facility] [place] Get Driving Directions [phone] [address] [place], [place] [address]    [facility] ([place]) Get Driving Directions [phone] [address] [place], [place] [address]  [facility] ([place]) Get Driving Directions [phone] [address] [place],  [place]  [address]  [facility] Get Driving Directions [phone] [address] [place], [place] [address]   [facility] [place] Get Driving Directions  [phone] [address] [place], [place] [address]   [facility] [place] Get Driving Directions [phone] [address] [place], [place] [address]  Your MyChart E-visit questionnaire answers were reviewed by a board certified advanced clinical practitioner to complete your personal care plan based on your specific symptoms.  Thank you for using e-Visits.    

## 2023-03-02 ENCOUNTER — Encounter: Payer: Medicaid Other | Admitting: Obstetrics & Gynecology

## 2023-03-02 ENCOUNTER — Encounter: Payer: Self-pay | Admitting: Advanced Practice Midwife

## 2023-03-02 ENCOUNTER — Other Ambulatory Visit: Payer: Self-pay | Admitting: Advanced Practice Midwife

## 2023-03-02 DIAGNOSIS — A599 Trichomoniasis, unspecified: Secondary | ICD-10-CM

## 2023-03-02 MED ORDER — SULFAMETHOXAZOLE-TRIMETHOPRIM 800-160 MG PO TABS: 1.0000 | ORAL_TABLET | Freq: Two times a day (BID) | ORAL | 0 refills | Status: DC

## 2023-03-02 MED ORDER — METRONIDAZOLE 500 MG PO TABS
500.0000 mg | ORAL_TABLET | Freq: Two times a day (BID) | ORAL | 0 refills | Status: DC
Start: 2023-03-02 — End: 2023-03-23

## 2023-03-02 NOTE — Progress Notes (Signed)
Still + for trichomonas.  Pt and partner stated that they had completed antibiotics.  Also still + for ASB; pt states she completed her antibiotics for this, as well.  Will retreat

## 2023-03-13 ENCOUNTER — Telehealth: Payer: Self-pay | Admitting: Pediatrics

## 2023-03-13 NOTE — Telephone Encounter (Signed)
  Substance Exposed Newborn telephone consult attempted without answer.  Voicemail unavailable.  Patient can reach the consult team at [phone].

## 2023-03-15 ENCOUNTER — Encounter: Payer: Self-pay | Admitting: Obstetrics & Gynecology

## 2023-03-15 ENCOUNTER — Ambulatory Visit: Payer: Medicaid Other | Admitting: Obstetrics & Gynecology

## 2023-03-15 VITALS — BP 117/71 | HR 96 | Wt 162.8 lb

## 2023-03-15 DIAGNOSIS — Z3A35 35 weeks gestation of pregnancy: Secondary | ICD-10-CM

## 2023-03-15 DIAGNOSIS — Z3490 Encounter for supervision of normal pregnancy, unspecified, unspecified trimester: Secondary | ICD-10-CM

## 2023-03-15 DIAGNOSIS — O34219 Maternal care for unspecified type scar from previous cesarean delivery: Secondary | ICD-10-CM

## 2023-03-15 DIAGNOSIS — F1991 Other psychoactive substance use, unspecified, in remission: Secondary | ICD-10-CM

## 2023-03-15 DIAGNOSIS — Z98891 History of uterine scar from previous surgery: Secondary | ICD-10-CM

## 2023-03-15 DIAGNOSIS — O0993 Supervision of high risk pregnancy, unspecified, third trimester: Secondary | ICD-10-CM

## 2023-03-15 DIAGNOSIS — O0943 Supervision of pregnancy with grand multiparity, third trimester: Secondary | ICD-10-CM

## 2023-03-15 NOTE — Progress Notes (Signed)
HIGH-RISK PREGNANCY VISIT Patient name: Janet Dixon MRN [id number]  Date of birth: 08/16/1985 Chief Complaint:   Routine Prenatal Visit  History of Present Illness:   Janet Dixon is a 37 y.o. [id number] female at [redacted]w[redacted]d with an Estimated Date of Delivery: 04/14/23 being seen today for ongoing management of a high-risk pregnancy complicated by:  -Prior C-section x 7 -Trich/bacteruria- still on treatment -polysubstance abuse  Today she reports no complaints.   Contractions: Not present. Vag. Bleeding: None.  Movement: Present. denies leaking of fluid.      02/23/2023    9:47 AM 11/09/2022    1:55 PM 05/11/2017    2:00 PM 08/29/2016    3:09 PM  Depression screen PHQ 2/9  Decreased Interest 0 0 1 0  Down, Depressed, Hopeless 0 0 2 0  PHQ - 2 Score 0 0 3 0  Altered sleeping 0 0 2   Tired, decreased energy 1 0 2   Change in appetite 0 0 2   Feeling bad or failure about yourself  0 0 0   Trouble concentrating 0 0 0   Moving slowly or fidgety/restless 0 0 0   Suicidal thoughts 0 0 0   PHQ-9 Score 1 0 9      Current Outpatient Medications  Medication Instructions   albuterol (VENTOLIN HFA) 108 (90 Base) MCG/ACT inhaler 2 puffs, Inhalation, Every 6 hours PRN   aspirin 162 mg, Oral, Daily   metroNIDAZOLE (FLAGYL) 500 mg, Oral, 2 times daily   pantoprazole (PROTONIX) 20 mg, Oral, Daily   Prenatal MV & Min w/FA-DHA (PRENATAL GUMMIES PO) Oral, Daily   Prenatal Vit-Fe Fumarate-FA (PRENATAL VITAMINS) 28-0.8 MG TABS Take by mouth.   promethazine (PHENERGAN) 12.5 mg, Oral, Every 6 hours PRN   sulfamethoxazole-trimethoprim (BACTRIM DS) 800-160 MG tablet 1 tablet, Oral, 2 times daily     Review of Systems:   Pertinent items are noted in HPI Denies abnormal vaginal discharge w/ itching/odor/irritation, headaches, visual changes, shortness of breath, chest pain, abdominal pain, severe nausea/vomiting, or problems with urination or bowel movements unless otherwise stated above. Pertinent  History Reviewed:  Reviewed past medical,surgical, social, obstetrical and family history.  Reviewed problem list, medications and allergies. Physical Assessment:   Vitals:   03/15/23 1606  BP: 117/71  Pulse: 96  Weight: 162 lb 12.8 oz (73.8 kg)  Body mass index is 31.79 kg/m.           Physical Examination:   General appearance: alert, well appearing, and in no distress  Mental status: normal mood, behavior, speech, dress, motor activity, and thought processes  Skin: warm & dry   Extremities:      Cardiovascular: normal heart rate noted  Respiratory: normal respiratory effort, no distress  Abdomen: gravid, soft, non-tender  Pelvic: Cervical exam deferred         Fetal Status: Fetal Heart Rate (bpm): 140 Fundal Height: 36 cm Movement: Present    Fetal Surveillance Testing today: doppler   Chaperone: N/A    No results found for this or any previous visit (from the past 24 hour(s)).   Assessment & Plan:  High-risk pregnancy: [id number] at [redacted]w[redacted]d with an Estimated Date of Delivery: 04/14/23   1) Prior C-section x 7 -will plan to schedule for [redacted]w[redacted]d  2) Contraceptive management -reviewed with patient that with each pregnancy and C-section increased risk of complications specifically risk of placenta accreta spectrum and related complications such as bleeding, hysterectomy and maternal death -discussed permanent sterilization at  time of C-section which she declined  3) h/o polysubstance abuse -plan for growth scan next available  4) GBS bacteruria, Trich -currently on treatment  Meds: No orders of the defined types were placed in this encounter.   Labs/procedures today: doppler  Treatment Plan:  routine OB care, growth scan next visit  Reviewed: Preterm labor symptoms and general obstetric precautions including but not limited to vaginal bleeding, contractions, leaking of fluid and fetal movement were reviewed in detail with the patient.  All questions were answered. Pt has  home bp cuff. Check bp weekly, let us know if >140/90.   Follow-up: Return in about 1 week (around 03/22/2023) for LROB visit.   Future Appointments  Date Time Provider Department Center  03/23/2023  4:10 PM Cresenzo-Dishmon, Scarlette Calico, CNM [facility]    Orders Placed This Encounter  Procedures   US OB Follow Up    Myna Hidalgo, DO Attending Obstetrician & Gynecologist, Faculty Practice Center for [facility], [facility]

## 2023-03-16 ENCOUNTER — Telehealth: Payer: Self-pay

## 2023-03-16 NOTE — Telephone Encounter (Signed)
Left voicemail on phone to call about getting u/s & provider visit scheduled. 12/12 @ 3:45 pm with appt with Ozan following.

## 2023-03-22 ENCOUNTER — Other Ambulatory Visit: Payer: Self-pay | Admitting: Obstetrics and Gynecology

## 2023-03-22 DIAGNOSIS — Z98891 History of uterine scar from previous surgery: Secondary | ICD-10-CM

## 2023-03-22 DIAGNOSIS — F1991 Other psychoactive substance use, unspecified, in remission: Secondary | ICD-10-CM

## 2023-03-23 ENCOUNTER — Ambulatory Visit (INDEPENDENT_AMBULATORY_CARE_PROVIDER_SITE_OTHER): Payer: Medicaid Other | Admitting: Obstetrics & Gynecology

## 2023-03-23 ENCOUNTER — Other Ambulatory Visit: Payer: Medicaid Other

## 2023-03-23 ENCOUNTER — Other Ambulatory Visit (HOSPITAL_COMMUNITY)
Admission: RE | Admit: 2023-03-23 | Discharge: 2023-03-23 | Disposition: A | Discharge status: Home / Self Care | Payer: Medicaid Other | Source: Ambulatory Visit | Attending: Obstetrics & Gynecology | Admitting: Obstetrics & Gynecology

## 2023-03-23 ENCOUNTER — Encounter: Payer: Medicaid Other | Admitting: Advanced Practice Midwife

## 2023-03-23 ENCOUNTER — Encounter: Payer: Self-pay | Admitting: Obstetrics & Gynecology

## 2023-03-23 VITALS — BP 117/71 | HR 83 | Wt 166.0 lb

## 2023-03-23 DIAGNOSIS — F1991 Other psychoactive substance use, unspecified, in remission: Secondary | ICD-10-CM | POA: Diagnosis not present

## 2023-03-23 DIAGNOSIS — Z3A36 36 weeks gestation of pregnancy: Secondary | ICD-10-CM

## 2023-03-23 DIAGNOSIS — Z98891 History of uterine scar from previous surgery: Secondary | ICD-10-CM

## 2023-03-23 DIAGNOSIS — O34219 Maternal care for unspecified type scar from previous cesarean delivery: Secondary | ICD-10-CM

## 2023-03-23 DIAGNOSIS — A599 Trichomoniasis, unspecified: Secondary | ICD-10-CM

## 2023-03-23 DIAGNOSIS — O0993 Supervision of high risk pregnancy, unspecified, third trimester: Secondary | ICD-10-CM | POA: Diagnosis not present

## 2023-03-23 DIAGNOSIS — O09523 Supervision of elderly multigravida, third trimester: Secondary | ICD-10-CM

## 2023-03-23 DIAGNOSIS — R8271 Bacteriuria: Secondary | ICD-10-CM

## 2023-03-23 DIAGNOSIS — O2343 Unspecified infection of urinary tract in pregnancy, third trimester: Secondary | ICD-10-CM

## 2023-03-23 DIAGNOSIS — O99343 Other mental disorders complicating pregnancy, third trimester: Secondary | ICD-10-CM

## 2023-03-23 MED ORDER — SULFAMETHOXAZOLE-TRIMETHOPRIM 800-160 MG PO TABS
1.0000 | ORAL_TABLET | Freq: Two times a day (BID) | ORAL | 0 refills | Status: AC
Start: 2023-03-23 — End: 2023-03-29

## 2023-03-23 MED ORDER — METRONIDAZOLE 500 MG PO TABS: 500.0000 mg | ORAL_TABLET | Freq: Two times a day (BID) | ORAL | 1 refills | Status: AC

## 2023-03-23 NOTE — Progress Notes (Signed)
[name] 36+6 wks,cephalic,dolichocephaly,FHR 142 bpm,AFI 16 cm,anterior placenta gr 3,EFW 2886 g 39%,BPD 2.7%,HC 32%

## 2023-03-23 NOTE — Progress Notes (Signed)
HIGH-RISK PREGNANCY VISIT Patient name: Janet Dixon MRN [id number]  Date of birth: [birth year] Chief Complaint:   Routine Prenatal Visit (GBS, GC/CHL)  History of Present Illness:   Janet Dixon is a 37 y.o. [id number] female at [redacted]w[redacted]d with an Estimated Date of Delivery: 04/14/23 being seen today for ongoing management of a high-risk pregnancy complicated by:  -prior C-section x 7 -illicit drug use in pregnancy -Trich/UTI pt notes pharmacy did not have treatment, currently asymptomatic   Today she reports  pelvic pressure .   Contractions: Not present. Vag. Bleeding: None.  Movement: Present. denies leaking of fluid.      02/23/2023    9:47 AM 11/09/2022    1:55 PM 05/11/2017    2:00 PM 08/29/2016    3:09 PM  Depression screen PHQ 2/9  Decreased Interest 0 0 1 0  Down, Depressed, Hopeless 0 0 2 0  PHQ - 2 Score 0 0 3 0  Altered sleeping 0 0 2   Tired, decreased energy 1 0 2   Change in appetite 0 0 2   Feeling bad or failure about yourself  0 0 0   Trouble concentrating 0 0 0   Moving slowly or fidgety/restless 0 0 0   Suicidal thoughts 0 0 0   PHQ-9 Score 1 0 9      Current Outpatient Medications  Medication Instructions   albuterol (VENTOLIN HFA) 108 (90 Base) MCG/ACT inhaler 2 puffs, Inhalation, Every 6 hours PRN   metroNIDAZOLE (FLAGYL) 500 mg, Oral, 2 times daily   sulfamethoxazole-trimethoprim (BACTRIM DS) 800-160 MG tablet 1 tablet, Oral, 2 times daily     Review of Systems:   Pertinent items are noted in HPI Denies abnormal vaginal discharge w/ itching/odor/irritation, headaches, visual changes, shortness of breath, chest pain, abdominal pain, severe nausea/vomiting, or problems with urination or bowel movements unless otherwise stated above. Pertinent History Reviewed:  Reviewed past medical,surgical, social, obstetrical and family history.  Reviewed problem list, medications and allergies. Physical Assessment:   Vitals:   03/23/23 1627  BP: 117/71   Pulse: 83  Weight: 166 lb (75.3 kg)  Body mass index is 32.42 kg/m.           Physical Examination:   General appearance: alert, well appearing, and in no distress  Mental status: normal mood, behavior, speech, dress, motor activity, and thought processes  Skin: warm & dry   Extremities:      Cardiovascular: normal heart rate noted  Respiratory: normal respiratory effort, no distress  Abdomen: gravid, soft, non-tender  Pelvic: Cervical exam deferred         Fetal Status:     Movement: Present    Fetal Surveillance Testing today: cephalic,dolichocephaly,FHR 142 bpm,AFI 16 cm,anterior placenta gr 3,EFW 2886 g 39%,BPD 2.7%,HC 32%    Chaperone: N/A    No results found for this or any previous visit (from the past 24 hours).   Assessment & Plan:  High-risk pregnancy: [id number] at [redacted]w[redacted]d with an Estimated Date of Delivery: 04/14/23   -prior C-section x 7 Scheduled for 12/27 -illicit drug use in pregnancy -Trich x 3 Treatment for both patient and partner sent in -UTI Treatment recent -Dolichocephaly Reviewed findings on exam, reassured patient that this may be due to fetal positioning Will follow-up post delivery Meds: No orders of the defined types were placed in this encounter.   Labs/procedures today: growth  Treatment Plan:  routine OB care and as outlined above  Reviewed: Term labor symptoms and  general obstetric precautions including but not limited to vaginal bleeding, contractions, leaking of fluid and fetal movement were reviewed in detail with the patient.  All questions were answered.   Follow-up: No follow-ups on file.   Future Appointments  Date Time Provider Department Center  04/06/2023 10:30 AM [facility] PAT 1 [facility] None    No orders of the defined types were placed in this encounter.   Myna Hidalgo, DO Attending Obstetrician & Gynecologist, [facility] for [employer], [facility]

## 2023-03-24 NOTE — Patient Instructions (Signed)
[name]  03/24/2023   Your procedure is scheduled on:  04/07/2023  Arrive at 1245 at Entrance C on [address] at [facility]  and [employer]. You are invited to use the FREE valet parking or use the Visitor's parking deck.  Pick up the phone at the desk and dial [phone].  Call this number if you have problems the morning of surgery: [phone]  Remember:   Do not eat food:(After Midnight) Desps de medianoche.  You may drink clear liquids until arrival at __1245___.  Clear liquids means a liquid you can see thru.  It can have color such as Cola or Kool aid.  Tea is OK and coffee as long as no milk or creamer of any kind.  Take these medicines the morning of surgery with A SIP OF WATER:  none   Do not wear jewelry, make-up or nail polish.  Do not wear lotions, powders, or perfumes. Do not wear deodorant.  Do not shave 48 hours prior to surgery.  Do not bring valuables to the hospital.  [facility] is not   responsible for any belongings or valuables brought to the hospital.  Contacts, dentures or bridgework may not be worn into surgery.  Leave suitcase in the car. After surgery it may be brought to your room.  For patients admitted to the hospital, checkout time is 11:00 AM the day of              discharge.      Please read over the following fact sheets that you were given:     Preparing for Surgery

## 2023-03-27 LAB — CERVICOVAGINAL ANCILLARY ONLY
Chlamydia: NEGATIVE
Comment: NEGATIVE
Comment: NORMAL
Neisseria Gonorrhea: NEGATIVE

## 2023-03-28 ENCOUNTER — Telehealth (HOSPITAL_COMMUNITY): Payer: Self-pay | Admitting: *Deleted

## 2023-03-28 ENCOUNTER — Encounter (HOSPITAL_COMMUNITY): Payer: Self-pay

## 2023-03-28 ENCOUNTER — Encounter: Payer: Self-pay | Admitting: Obstetrics & Gynecology

## 2023-03-28 NOTE — Telephone Encounter (Signed)
Preadmission screen  

## 2023-03-29 ENCOUNTER — Telehealth (HOSPITAL_COMMUNITY): Payer: Self-pay | Admitting: *Deleted

## 2023-03-29 ENCOUNTER — Encounter: Payer: Self-pay | Admitting: Obstetrics & Gynecology

## 2023-03-29 ENCOUNTER — Ambulatory Visit (INDEPENDENT_AMBULATORY_CARE_PROVIDER_SITE_OTHER): Payer: Medicaid Other | Admitting: Obstetrics & Gynecology

## 2023-03-29 VITALS — BP 121/75 | HR 85 | Wt 166.0 lb

## 2023-03-29 DIAGNOSIS — O98313 Other infections with a predominantly sexual mode of transmission complicating pregnancy, third trimester: Secondary | ICD-10-CM

## 2023-03-29 DIAGNOSIS — O0993 Supervision of high risk pregnancy, unspecified, third trimester: Secondary | ICD-10-CM

## 2023-03-29 DIAGNOSIS — Z3A37 37 weeks gestation of pregnancy: Secondary | ICD-10-CM

## 2023-03-29 DIAGNOSIS — A599 Trichomoniasis, unspecified: Secondary | ICD-10-CM

## 2023-03-29 DIAGNOSIS — F1991 Other psychoactive substance use, unspecified, in remission: Secondary | ICD-10-CM

## 2023-03-29 DIAGNOSIS — O99323 Drug use complicating pregnancy, third trimester: Secondary | ICD-10-CM

## 2023-03-29 DIAGNOSIS — Z98891 History of uterine scar from previous surgery: Secondary | ICD-10-CM

## 2023-03-29 NOTE — Progress Notes (Signed)
HIGH-RISK PREGNANCY VISIT Patient name: Janet Dixon MRN [id number]  Date of birth: 1985/11/16 Chief Complaint:   Routine Prenatal Visit  History of Present Illness:   Janet Dixon is a 37 y.o. [id number] female at [redacted]w[redacted]d with an Estimated Date of Delivery: 04/14/23 being seen today for ongoing management of a high-risk pregnancy complicated by:  -prior C-section x 7 -tobacco use -h/o polysubstance use  -Trich- treatment sent in last visit  Today she reports occasional contractions.   Contractions: Not present. Vag. Bleeding: None.  Movement: Present. denies leaking of fluid.      02/23/2023    9:47 AM 11/09/2022    1:55 PM 05/11/2017    2:00 PM 08/29/2016    3:09 PM  Depression screen PHQ 2/9  Decreased Interest 0 0 1 0  Down, Depressed, Hopeless 0 0 2 0  PHQ - 2 Score 0 0 3 0  Altered sleeping 0 0 2   Tired, decreased energy 1 0 2   Change in appetite 0 0 2   Feeling bad or failure about yourself  0 0 0   Trouble concentrating 0 0 0   Moving slowly or fidgety/restless 0 0 0   Suicidal thoughts 0 0 0   PHQ-9 Score 1 0 9      Current Outpatient Medications  Medication Instructions   albuterol (VENTOLIN HFA) 108 (90 Base) MCG/ACT inhaler 2 puffs, Inhalation, Every 6 hours PRN   metroNIDAZOLE (FLAGYL) 500 mg, Oral, 2 times daily     Review of Systems:   Pertinent items are noted in HPI Denies abnormal vaginal discharge w/ itching/odor/irritation, headaches, visual changes, shortness of breath, chest pain, abdominal pain, severe nausea/vomiting, or problems with urination or bowel movements unless otherwise stated above. Pertinent History Reviewed:  Reviewed past medical,surgical, social, obstetrical and family history.  Reviewed problem list, medications and allergies. Physical Assessment:   Vitals:   03/29/23 1458  BP: 121/75  Pulse: 85  Weight: 166 lb (75.3 kg)  Body mass index is 32.42 kg/m.           Physical Examination:   General appearance: alert, well  appearing, and in no distress  Mental status: normal mood, behavior, speech, dress, motor activity, and thought processes  Skin: warm & dry   Extremities:      Cardiovascular: normal heart rate noted  Respiratory: normal respiratory effort, no distress  Abdomen: gravid, soft, non-tender  Pelvic: Cervical exam performed  Dilation: Closed Effacement (%): Thick Station: -3  Fetal Status: Fetal Heart Rate (bpm): 150   Movement: Present    Fetal Surveillance Testing today: doppler   Chaperone: Faith Rogue    No results found for this or any previous visit (from the past 24 hours).   Assessment & Plan:  High-risk pregnancy: [id number] at [redacted]w[redacted]d with an Estimated Date of Delivery: 04/14/23   -prior C-section x 7 Repeat scheduled 12/27 Reviewed upcoming appointment  -tobacco use -h/o polysubstance use  -Trich - recently treated  Meds: No orders of the defined types were placed in this encounter.   Labs/procedures today: doppler  Treatment Plan:  routine OB care, reviewed labor precautions, pt declined visit next week since C-section scheduled for Friday  Reviewed: Term labor symptoms and general obstetric precautions including but not limited to vaginal bleeding, contractions, leaking of fluid and fetal movement were reviewed in detail with the patient.  All questions were answered. Pt has home bp cuff. Check bp weekly, let us know if >140/90.  Follow-up: Return in about 2 weeks (around 04/12/2023) for 1 wk postop incision check (scheduled for 12/27 C-section).   Future Appointments  Date Time Provider Department Center  04/06/2023 10:30 AM [facility] PAT 1 [facility] None    No orders of the defined types were placed in this encounter.   Myna Hidalgo, DO Attending Obstetrician & Gynecologist, [facility] for [employer], [facility]

## 2023-03-29 NOTE — Telephone Encounter (Signed)
Preadmission screen  

## 2023-03-30 ENCOUNTER — Telehealth (HOSPITAL_COMMUNITY): Payer: Self-pay | Admitting: *Deleted

## 2023-03-30 NOTE — Telephone Encounter (Signed)
Preadmission screen  

## 2023-03-31 ENCOUNTER — Encounter (HOSPITAL_COMMUNITY): Payer: Self-pay

## 2023-04-02 ENCOUNTER — Inpatient Hospital Stay (HOSPITAL_COMMUNITY)
Admission: EM | Admit: 2023-04-02 | Discharge: 2023-04-05 | DRG: 787 | Disposition: A | Discharge status: Home / Self Care | Payer: Medicaid Other | Attending: Obstetrics & Gynecology | Admitting: Obstetrics & Gynecology

## 2023-04-02 ENCOUNTER — Other Ambulatory Visit: Payer: Self-pay

## 2023-04-02 ENCOUNTER — Inpatient Hospital Stay (HOSPITAL_COMMUNITY): Payer: Medicaid Other | Admitting: Anesthesiology

## 2023-04-02 ENCOUNTER — Encounter (HOSPITAL_COMMUNITY)
Admission: EM | Disposition: A | Discharge status: Home / Self Care | Payer: Self-pay | Source: Home / Self Care | Attending: Obstetrics & Gynecology

## 2023-04-02 ENCOUNTER — Encounter (HOSPITAL_COMMUNITY): Payer: Self-pay | Admitting: Emergency Medicine

## 2023-04-02 DIAGNOSIS — Z3490 Encounter for supervision of normal pregnancy, unspecified, unspecified trimester: Secondary | ICD-10-CM

## 2023-04-02 DIAGNOSIS — Z886 Allergy status to analgesic agent status: Secondary | ICD-10-CM

## 2023-04-02 DIAGNOSIS — O99334 Smoking (tobacco) complicating childbirth: Secondary | ICD-10-CM | POA: Diagnosis present

## 2023-04-02 DIAGNOSIS — Z888 Allergy status to other drugs, medicaments and biological substances status: Secondary | ICD-10-CM | POA: Diagnosis not present

## 2023-04-02 DIAGNOSIS — O9902 Anemia complicating childbirth: Secondary | ICD-10-CM | POA: Diagnosis present

## 2023-04-02 DIAGNOSIS — Z3A38 38 weeks gestation of pregnancy: Secondary | ICD-10-CM

## 2023-04-02 DIAGNOSIS — Z3A00 Weeks of gestation of pregnancy not specified: Secondary | ICD-10-CM

## 2023-04-02 DIAGNOSIS — O99324 Drug use complicating childbirth: Secondary | ICD-10-CM | POA: Diagnosis not present

## 2023-04-02 DIAGNOSIS — O99820 Streptococcus B carrier state complicating pregnancy: Secondary | ICD-10-CM | POA: Diagnosis not present

## 2023-04-02 DIAGNOSIS — Z30430 Encounter for insertion of intrauterine contraceptive device: Secondary | ICD-10-CM | POA: Diagnosis not present

## 2023-04-02 DIAGNOSIS — Z3483 Encounter for supervision of other normal pregnancy, third trimester: Secondary | ICD-10-CM

## 2023-04-02 DIAGNOSIS — O34219 Maternal care for unspecified type scar from previous cesarean delivery: Secondary | ICD-10-CM | POA: Diagnosis present

## 2023-04-02 DIAGNOSIS — F17210 Nicotine dependence, cigarettes, uncomplicated: Secondary | ICD-10-CM | POA: Diagnosis present

## 2023-04-02 DIAGNOSIS — O09523 Supervision of elderly multigravida, third trimester: Secondary | ICD-10-CM

## 2023-04-02 DIAGNOSIS — F1490 Cocaine use, unspecified, uncomplicated: Secondary | ICD-10-CM | POA: Diagnosis present

## 2023-04-02 DIAGNOSIS — Z8249 Family history of ischemic heart disease and other diseases of the circulatory system: Secondary | ICD-10-CM

## 2023-04-02 DIAGNOSIS — Z01818 Encounter for other preprocedural examination: Secondary | ICD-10-CM

## 2023-04-02 DIAGNOSIS — O34211 Maternal care for low transverse scar from previous cesarean delivery: Principal | ICD-10-CM | POA: Diagnosis present

## 2023-04-02 DIAGNOSIS — R8271 Bacteriuria: Secondary | ICD-10-CM

## 2023-04-02 DIAGNOSIS — O99214 Obesity complicating childbirth: Secondary | ICD-10-CM | POA: Diagnosis present

## 2023-04-02 DIAGNOSIS — Z98891 History of uterine scar from previous surgery: Secondary | ICD-10-CM

## 2023-04-02 LAB — URINALYSIS, ROUTINE W REFLEX MICROSCOPIC
Bilirubin Urine: NEGATIVE
Glucose, UA: NEGATIVE mg/dL
Hgb urine dipstick: NEGATIVE
Ketones, ur: NEGATIVE mg/dL
Nitrite: NEGATIVE
Protein, ur: NEGATIVE mg/dL
Specific Gravity, Urine: 1.019 (ref 1.005–1.030)
pH: 5 (ref 5.0–8.0)

## 2023-04-02 LAB — RAPID URINE DRUG SCREEN, HOSP PERFORMED
Amphetamines: NOT DETECTED
Barbiturates: NOT DETECTED
Benzodiazepines: NOT DETECTED
Cocaine: POSITIVE — AB
Opiates: NOT DETECTED
Tetrahydrocannabinol: POSITIVE — AB

## 2023-04-02 LAB — COMPREHENSIVE METABOLIC PANEL
ALT: 16 U/L (ref 0–44)
AST: 18 U/L (ref 15–41)
Albumin: 2.6 g/dL — ABNORMAL LOW (ref 3.5–5.0)
Alkaline Phosphatase: 185 U/L — ABNORMAL HIGH (ref 38–126)
Anion gap: 9 (ref 5–15)
BUN: 12 mg/dL (ref 6–20)
CO2: 19 mmol/L — ABNORMAL LOW (ref 22–32)
Calcium: 9 mg/dL (ref 8.9–10.3)
Chloride: 108 mmol/L (ref 98–111)
Creatinine, Ser: 0.56 mg/dL (ref 0.44–1.00)
GFR, Estimated: >60 mL/min (ref >60)
Glucose, Bld: 96 mg/dL (ref 70–99)
Potassium: 4.1 mmol/L (ref 3.5–5.1)
Sodium: 136 mmol/L (ref 135–145)
Total Bilirubin: 0.2 mg/dL (ref <1.2)
Total Protein: 6.2 g/dL — ABNORMAL LOW (ref 6.5–8.1)

## 2023-04-02 LAB — CBC
HCT: 30.3 % — ABNORMAL LOW (ref 36.0–46.0)
HCT: 31.8 % — ABNORMAL LOW (ref 36.0–46.0)
Hemoglobin: 9.9 g/dL — ABNORMAL LOW (ref 12.0–15.0)
Hemoglobin: 10.4 g/dL — ABNORMAL LOW (ref 12.0–15.0)
MCH: 32.6 pg (ref 26.0–34.0)
MCH: 32.5 pg (ref 26.0–34.0)
MCHC: 32.7 g/dL (ref 30.0–36.0)
MCHC: 32.7 g/dL (ref 30.0–36.0)
MCV: 99.7 fL (ref 80.0–100.0)
MCV: 99.4 fL (ref 80.0–100.0)
Platelets: 239 10*3/uL (ref 150–400)
Platelets: 267 10*3/uL (ref 150–400)
RBC: 3.04 MIL/uL — ABNORMAL LOW (ref 3.87–5.11)
RBC: 3.2 MIL/uL — ABNORMAL LOW (ref 3.87–5.11)
RDW: 13.4 % (ref 11.5–15.5)
RDW: 13.3 % (ref 11.5–15.5)
WBC: 7.7 10*3/uL (ref 4.0–10.5)
WBC: 8.9 10*3/uL (ref 4.0–10.5)
nRBC: 0 % (ref 0.0–0.2)
nRBC: 0 % (ref 0.0–0.2)

## 2023-04-02 LAB — PREPARE RBC (CROSSMATCH)

## 2023-04-02 LAB — HIV ANTIBODY (ROUTINE TESTING W REFLEX): HIV Screen 4th Generation wRfx: NONREACTIVE

## 2023-04-02 SURGERY — Surgical Case
Anesthesia: Spinal

## 2023-04-02 MED ORDER — DIBUCAINE (PERIANAL) 1 % EX OINT: 1.0000 | TOPICAL_OINTMENT | CUTANEOUS | Status: DC | PRN

## 2023-04-02 MED ORDER — METHYLERGONOVINE MALEATE 0.2 MG PO TABS: 0.2000 mg | ORAL_TABLET | ORAL | Status: AC

## 2023-04-02 MED ORDER — OXYCODONE HCL 5 MG PO TABS: 5.0000 mg | ORAL_TABLET | Freq: Once | ORAL | Status: DC | PRN

## 2023-04-02 MED ORDER — TETANUS-DIPHTH-ACELL PERTUSSIS 5-2.5-18.5 LF-MCG/0.5 IM SUSY: 0.5000 mL | PREFILLED_SYRINGE | Freq: Once | INTRAMUSCULAR | Status: DC

## 2023-04-02 MED ORDER — FENTANYL CITRATE (PF) 100 MCG/2ML IJ SOLN
INTRAMUSCULAR | Status: AC
  Filled 2023-04-02: qty 2

## 2023-04-02 MED ORDER — BUPIVACAINE IN DEXTROSE 0.75-8.25 % IT SOLN
INTRATHECAL | Status: DC | PRN
  Administered 2023-04-02: 1.6 mL via INTRATHECAL

## 2023-04-02 MED ORDER — DEXAMETHASONE SODIUM PHOSPHATE 10 MG/ML IJ SOLN
INTRAMUSCULAR | Status: AC
  Filled 2023-04-02: qty 1

## 2023-04-02 MED ORDER — DIPHENHYDRAMINE HCL 50 MG/ML IJ SOLN: 12.5000 mg | INTRAMUSCULAR | Status: DC | PRN

## 2023-04-02 MED ORDER — ACETAMINOPHEN 10 MG/ML IV SOLN
INTRAVENOUS | Status: DC | PRN
Start: 2023-04-02 — End: 2023-04-02
  Administered 2023-04-02: 1000 mg via INTRAVENOUS

## 2023-04-02 MED ORDER — SODIUM CHLORIDE 0.9 % IV SOLN
500.0000 mg | INTRAVENOUS | Status: DC
  Filled 2023-04-02: qty 5

## 2023-04-02 MED ORDER — SODIUM CHLORIDE 0.9 % IR SOLN
Status: DC | PRN
  Administered 2023-04-02: 1

## 2023-04-02 MED ORDER — SIMETHICONE 80 MG PO CHEW
80.0000 mg | CHEWABLE_TABLET | Freq: Three times a day (TID) | ORAL | Status: DC
  Administered 2023-04-03 – 2023-04-05 (×5): 80 mg via ORAL
  Filled 2023-04-02 (×5): qty 1

## 2023-04-02 MED ORDER — KETOROLAC TROMETHAMINE 30 MG/ML IJ SOLN
30.0000 mg | Freq: Four times a day (QID) | INTRAMUSCULAR | Status: DC
  Administered 2023-04-03 (×3): 30 mg via INTRAVENOUS
  Filled 2023-04-02 (×3): qty 1

## 2023-04-02 MED ORDER — COCONUT OIL OIL: 1.0000 | TOPICAL_OIL | Status: DC | PRN

## 2023-04-02 MED ORDER — KETOROLAC TROMETHAMINE 30 MG/ML IJ SOLN
INTRAMUSCULAR | Status: DC | PRN
  Administered 2023-04-02: 30 mg via INTRAVENOUS

## 2023-04-02 MED ORDER — WITCH HAZEL-GLYCERIN EX PADS: 1.0000 | MEDICATED_PAD | CUTANEOUS | Status: DC | PRN

## 2023-04-02 MED ORDER — ONDANSETRON HCL 4 MG/2ML IJ SOLN: 4.0000 mg | Freq: Three times a day (TID) | INTRAMUSCULAR | Status: DC | PRN

## 2023-04-02 MED ORDER — KETOROLAC TROMETHAMINE 30 MG/ML IJ SOLN
INTRAMUSCULAR | Status: AC
  Filled 2023-04-02: qty 1

## 2023-04-02 MED ORDER — ZOLPIDEM TARTRATE 5 MG PO TABS
5.0000 mg | ORAL_TABLET | Freq: Every evening | ORAL | Status: DC | PRN
Start: 2023-04-02 — End: 2023-04-05

## 2023-04-02 MED ORDER — MEASLES, MUMPS & RUBELLA VAC IJ SOLR: 0.5000 mL | Freq: Once | INTRAMUSCULAR | Status: DC

## 2023-04-02 MED ORDER — SENNOSIDES-DOCUSATE SODIUM 8.6-50 MG PO TABS
2.0000 | ORAL_TABLET | Freq: Every day | ORAL | Status: DC
  Administered 2023-04-03 – 2023-04-05 (×3): 2 via ORAL
  Filled 2023-04-02 (×3): qty 2

## 2023-04-02 MED ORDER — NALOXONE HCL 0.4 MG/ML IJ SOLN: 0.4000 mg | INTRAMUSCULAR | Status: DC | PRN

## 2023-04-02 MED ORDER — DIPHENHYDRAMINE HCL 25 MG PO CAPS: 25.0000 mg | ORAL_CAPSULE | Freq: Four times a day (QID) | ORAL | Status: DC | PRN

## 2023-04-02 MED ORDER — SODIUM CHLORIDE 0.9% FLUSH: 3.0000 mL | INTRAVENOUS | Status: DC | PRN

## 2023-04-02 MED ORDER — PHENYLEPHRINE HCL-NACL 20-0.9 MG/250ML-% IV SOLN
INTRAVENOUS | Status: DC | PRN
  Administered 2023-04-02: 60 ug/min via INTRAVENOUS

## 2023-04-02 MED ORDER — MENTHOL 3 MG MT LOZG: 1.0000 | LOZENGE | OROMUCOSAL | Status: DC | PRN

## 2023-04-02 MED ORDER — AMISULPRIDE (ANTIEMETIC) 5 MG/2ML IV SOLN: 10.0000 mg | Freq: Once | INTRAVENOUS | Status: DC | PRN

## 2023-04-02 MED ORDER — ONDANSETRON HCL 4 MG/2ML IJ SOLN
INTRAMUSCULAR | Status: AC
  Filled 2023-04-02: qty 2

## 2023-04-02 MED ORDER — SODIUM CHLORIDE 0.9% IV SOLUTION: Freq: Once | INTRAVENOUS | Status: DC

## 2023-04-02 MED ORDER — SODIUM CHLORIDE 0.9 % IV SOLN: 12.5000 mg | INTRAVENOUS | Status: DC | PRN

## 2023-04-02 MED ORDER — SCOPOLAMINE 1 MG/3DAYS TD PT72: 1.0000 | MEDICATED_PATCH | Freq: Once | TRANSDERMAL | Status: DC

## 2023-04-02 MED ORDER — SCOPOLAMINE 1 MG/3DAYS TD PT72
MEDICATED_PATCH | TRANSDERMAL | Status: DC | PRN
  Administered 2023-04-02: 1 via TRANSDERMAL

## 2023-04-02 MED ORDER — ALBUTEROL SULFATE HFA 108 (90 BASE) MCG/ACT IN AERS: 2.0000 | INHALATION_SPRAY | Freq: Four times a day (QID) | RESPIRATORY_TRACT | Status: DC | PRN

## 2023-04-02 MED ORDER — SIMETHICONE 80 MG PO CHEW
80.0000 mg | CHEWABLE_TABLET | ORAL | Status: DC | PRN
Start: 2023-04-02 — End: 2023-04-05
  Administered 2023-04-03: 80 mg via ORAL
  Filled 2023-04-02: qty 1

## 2023-04-02 MED ORDER — ENOXAPARIN SODIUM 40 MG/0.4ML IJ SOSY
40.0000 mg | PREFILLED_SYRINGE | INTRAMUSCULAR | Status: DC
  Administered 2023-04-03 – 2023-04-04 (×2): 40 mg via SUBCUTANEOUS
  Filled 2023-04-02 (×3): qty 0.4

## 2023-04-02 MED ORDER — NALOXONE HCL 4 MG/10ML IJ SOLN: 1.0000 ug/kg/h | INTRAVENOUS | Status: DC | PRN

## 2023-04-02 MED ORDER — SOD CITRATE-CITRIC ACID 500-334 MG/5ML PO SOLN
30.0000 mL | ORAL | Status: AC
Start: 2023-04-02 — End: 2023-04-02
  Administered 2023-04-02: 30 mL via ORAL
  Filled 2023-04-02: qty 30

## 2023-04-02 MED ORDER — FENTANYL CITRATE (PF) 100 MCG/2ML IJ SOLN
INTRAMUSCULAR | Status: DC | PRN
  Administered 2023-04-02: 85 ug via INTRAVENOUS

## 2023-04-02 MED ORDER — LEVONORGESTREL 20 MCG/DAY IU IUD
1.0000 | INTRAUTERINE_SYSTEM | Freq: Once | INTRAUTERINE | Status: AC
  Administered 2023-04-02: 1 via INTRAUTERINE

## 2023-04-02 MED ORDER — OXYTOCIN-SODIUM CHLORIDE 30-0.9 UT/500ML-% IV SOLN
2.5000 [IU]/h | INTRAVENOUS | Status: AC
  Administered 2023-04-02: 2.5 [IU]/h via INTRAVENOUS
  Filled 2023-04-02: qty 500

## 2023-04-02 MED ORDER — TRANEXAMIC ACID-NACL 1000-0.7 MG/100ML-% IV SOLN
1000.0000 mg | Freq: Once | INTRAVENOUS | Status: AC
  Administered 2023-04-02: 1000 mg via INTRAVENOUS

## 2023-04-02 MED ORDER — GABAPENTIN 100 MG PO CAPS
300.0000 mg | ORAL_CAPSULE | Freq: Three times a day (TID) | ORAL | Status: DC
  Administered 2023-04-02 – 2023-04-05 (×8): 300 mg via ORAL
  Filled 2023-04-02 (×8): qty 3

## 2023-04-02 MED ORDER — MORPHINE SULFATE (PF) 0.5 MG/ML IJ SOLN
INTRAMUSCULAR | Status: AC
  Filled 2023-04-02: qty 10

## 2023-04-02 MED ORDER — DIPHENHYDRAMINE HCL 25 MG PO CAPS: 25.0000 mg | ORAL_CAPSULE | ORAL | Status: DC | PRN

## 2023-04-02 MED ORDER — FENTANYL CITRATE (PF) 100 MCG/2ML IJ SOLN
25.0000 ug | INTRAMUSCULAR | Status: DC | PRN
  Administered 2023-04-02: 25 ug via INTRAVENOUS

## 2023-04-02 MED ORDER — MEPERIDINE HCL 25 MG/ML IJ SOLN: 6.2500 mg | INTRAMUSCULAR | Status: DC | PRN

## 2023-04-02 MED ORDER — CEFAZOLIN SODIUM-DEXTROSE 2-4 GM/100ML-% IV SOLN: 2.0000 g | INTRAVENOUS | Status: DC

## 2023-04-02 MED ORDER — MORPHINE SULFATE (PF) 0.5 MG/ML IJ SOLN
INTRAMUSCULAR | Status: DC | PRN
  Administered 2023-04-02: 150 ug via INTRATHECAL

## 2023-04-02 MED ORDER — TRANEXAMIC ACID-NACL 1000-0.7 MG/100ML-% IV SOLN: 1000.0000 mg | Freq: Once | INTRAVENOUS | Status: DC

## 2023-04-02 MED ORDER — DEXMEDETOMIDINE HCL IN NACL 80 MCG/20ML IV SOLN
INTRAVENOUS | Status: AC
  Filled 2023-04-02: qty 20

## 2023-04-02 MED ORDER — ONDANSETRON HCL 4 MG/2ML IJ SOLN
INTRAMUSCULAR | Status: DC | PRN
  Administered 2023-04-02: 4 mg via INTRAVENOUS

## 2023-04-02 MED ORDER — ACETAMINOPHEN 10 MG/ML IV SOLN
INTRAVENOUS | Status: AC
  Filled 2023-04-02: qty 100

## 2023-04-02 MED ORDER — CEFAZOLIN SODIUM-DEXTROSE 2-4 GM/100ML-% IV SOLN
2.0000 g | INTRAVENOUS | Status: AC
  Administered 2023-04-02: 2 g via INTRAVENOUS
  Filled 2023-04-02: qty 100

## 2023-04-02 MED ORDER — DEXAMETHASONE SODIUM PHOSPHATE 10 MG/ML IJ SOLN
INTRAMUSCULAR | Status: DC | PRN
  Administered 2023-04-02: 10 mg via INTRAVENOUS

## 2023-04-02 MED ORDER — PRENATAL 27-0.8 MG PO TABS: 1.0000 | ORAL_TABLET | Freq: Every day | ORAL | Status: DC

## 2023-04-02 MED ORDER — FENTANYL CITRATE (PF) 100 MCG/2ML IJ SOLN
INTRAMUSCULAR | Status: DC | PRN
  Administered 2023-04-02: 15 ug via INTRATHECAL

## 2023-04-02 MED ORDER — OXYCODONE HCL 5 MG/5ML PO SOLN
5.0000 mg | Freq: Once | ORAL | Status: DC | PRN
Start: 2023-04-02 — End: 2023-04-02

## 2023-04-02 MED ORDER — LEVONORGESTREL 20 MCG/DAY IU IUD
INTRAUTERINE_SYSTEM | INTRAUTERINE | Status: AC
Start: 2023-04-02 — End: ?
  Filled 2023-04-02: qty 1

## 2023-04-02 MED ORDER — OXYTOCIN-SODIUM CHLORIDE 30-0.9 UT/500ML-% IV SOLN
INTRAVENOUS | Status: DC | PRN
  Administered 2023-04-02: 30 [IU] via INTRAVENOUS

## 2023-04-02 MED ORDER — POVIDONE-IODINE 10 % EX SWAB
2.0000 | Freq: Once | CUTANEOUS | Status: AC
  Administered 2023-04-02: 2 via TOPICAL

## 2023-04-02 MED ORDER — BUPIVACAINE IN DEXTROSE 0.75-8.25 % IT SOLN
INTRATHECAL | Status: AC
  Filled 2023-04-02: qty 4

## 2023-04-02 MED ORDER — OXYTOCIN-SODIUM CHLORIDE 30-0.9 UT/500ML-% IV SOLN
INTRAVENOUS | Status: AC
  Filled 2023-04-02: qty 500

## 2023-04-02 MED ORDER — METHYLERGONOVINE MALEATE 0.2 MG/ML IJ SOLN
INTRAMUSCULAR | Status: DC | PRN
  Administered 2023-04-02: .2 mg via INTRAMUSCULAR

## 2023-04-02 MED ORDER — LACTATED RINGERS IV SOLN: INTRAVENOUS | Status: DC

## 2023-04-02 MED ORDER — OXYCODONE-ACETAMINOPHEN 5-325 MG PO TABS: 1.0000 | ORAL_TABLET | ORAL | Status: DC | PRN

## 2023-04-02 MED ORDER — ALBUTEROL SULFATE (2.5 MG/3ML) 0.083% IN NEBU: 2.5000 mg | INHALATION_SOLUTION | Freq: Four times a day (QID) | RESPIRATORY_TRACT | Status: DC | PRN

## 2023-04-02 MED ORDER — METHYLERGONOVINE MALEATE 0.2 MG/ML IJ SOLN
INTRAMUSCULAR | Status: AC
  Filled 2023-04-02: qty 1

## 2023-04-02 MED ORDER — STERILE WATER FOR IRRIGATION IR SOLN
Status: DC | PRN
  Administered 2023-04-02: 1

## 2023-04-02 MED ORDER — PRENATAL MULTIVITAMIN CH
1.0000 | ORAL_TABLET | Freq: Every day | ORAL | Status: DC
  Administered 2023-04-03 – 2023-04-05 (×3): 1 via ORAL
  Filled 2023-04-02 (×3): qty 1

## 2023-04-02 MED ORDER — DEXMEDETOMIDINE HCL IN NACL 80 MCG/20ML IV SOLN
INTRAVENOUS | Status: DC | PRN
  Administered 2023-04-02: 20 ug via INTRAVENOUS
  Administered 2023-04-02: 12 ug via INTRAVENOUS

## 2023-04-02 SURGICAL SUPPLY — 29 items
BENZOIN TINCTURE PRP APPL 2/3 (GAUZE/BANDAGES/DRESSINGS) ×2 IMPLANT
CANISTER SUCT 3000ML PPV (MISCELLANEOUS) ×2 IMPLANT
CHLORAPREP W/TINT 26 (MISCELLANEOUS) ×4 IMPLANT
CLAMP UMBILICAL CORD (MISCELLANEOUS) ×2 IMPLANT
DERMABOND ADVANCED .7 DNX12 (GAUZE/BANDAGES/DRESSINGS) IMPLANT
DRSG OPSITE POSTOP 4X10 (GAUZE/BANDAGES/DRESSINGS) ×2 IMPLANT
ELECT REM PT RETURN 9FT ADLT (ELECTROSURGICAL) ×1
ELECTRODE REM PT RTRN 9FT ADLT (ELECTROSURGICAL) ×2 IMPLANT
EXTRACTOR VACUUM KIWI (MISCELLANEOUS) ×2 IMPLANT
GLOVE BIOGEL PI IND STRL 7.0 (GLOVE) ×4 IMPLANT
GLOVE BIOGEL PI IND STRL 7.5 (GLOVE) ×2 IMPLANT
GLOVE SURG SS PI 7.0 STRL IVOR (GLOVE) ×2 IMPLANT
GOWN STRL REUS W/ TWL LRG LVL3 (GOWN DISPOSABLE) ×4 IMPLANT
GOWN STRL REUS W/ TWL XL LVL3 (GOWN DISPOSABLE) ×2 IMPLANT
NS IRRIG 1000ML POUR BTL (IV SOLUTION) ×2 IMPLANT
PACK C SECTION WH (CUSTOM PROCEDURE TRAY) ×2 IMPLANT
PAD ABD 7.5X8 STRL (GAUZE/BANDAGES/DRESSINGS) ×2 IMPLANT
PAD OB MATERNITY 4.3X12.25 (PERSONAL CARE ITEMS) ×2 IMPLANT
PAD PREP 24X48 CUFFED NSTRL (MISCELLANEOUS) ×2 IMPLANT
RETRACTOR WND ALEXIS 25 LRG (MISCELLANEOUS) ×2 IMPLANT
RTRCTR WOUND ALEXIS 25CM LRG (MISCELLANEOUS) ×1
STRIP CLOSURE SKIN 1/2X4 (GAUZE/BANDAGES/DRESSINGS) ×2 IMPLANT
SUT MNCRL 0 VIOLET CTX 36 (SUTURE) ×4 IMPLANT
SUT MON AB 4-0 PS1 27 (SUTURE) ×2 IMPLANT
SUT PLAIN 2 0 XLH (SUTURE) ×2 IMPLANT
SUT VIC AB 0 CT1 36 (SUTURE) ×4 IMPLANT
SUT VIC AB 3-0 CT1 TAPERPNT 27 (SUTURE) ×2 IMPLANT
TOWEL OR 17X24 6PK STRL BLUE (TOWEL DISPOSABLE) ×4 IMPLANT
WATER STERILE IRR 1000ML POUR (IV SOLUTION) ×2 IMPLANT

## 2023-04-02 NOTE — ED Triage Notes (Signed)
Pt c/o contractions 10 min apart.

## 2023-04-02 NOTE — ED Notes (Signed)
MD at bedside, Cervical Dilation was checked and no dilation at this time. Pt being placed on tocometer.

## 2023-04-02 NOTE — ED Notes (Signed)
[facility] here to transport pt 

## 2023-04-02 NOTE — Op Note (Signed)
Preoperative diagnosis:  1.  Intrauterine pregnancy at [redacted]w[redacted]d  weeks gestation                                         2.  Fetal bradycardic episodes, recurrent                                         3.  +Cocaine on UDS today                                         4.  Previous C section x 7                                         5.   Consents to post placental IUD   Postoperative diagnosis:  Same as above   Procedure: Repeat cesarean section  Surgeon:  Lazaro Arms MD  Assistant:    Anesthesia: Spinal  Findings:  .    Over a low transverse incision was delivered a viable female with Apgars of 8 and 9 weighing 6 lbs. 7 oz. Uterus, tubes and ovaries were all normal.  There were no other significant findings  Description of operation:  Patient was taken to the operating room and placed in the sitting position where she underwent a spinal anesthetic. She was then placed in the supine position with tilt to the left side. When adequate anesthetic level was obtained she was prepped and draped in usual sterile fashion and a Foley catheter was placed. A Pfannenstiel skin incision was made and carried down sharply to the rectus fascia which was scored in the midline extended laterally. The fascia was taken off the muscles both superiorly and without difficulty. The muscles were divided.  The peritoneal cavity was entered.  Bladder blade was placed, no bladder flap was created.  A low transverse hysterotomy incision was made and delivered a viable female  infant at 12 with Apgars of 8 and 9 weighing6 lbs 7 oz.  Cord pH was obtained and was 7.31. The uterus was exteriorized. It was closed in 2 layers, the first being a running interlocking layer and the second being an imbricating layer using 0 monocryl on a CTX needle. There was good resulting hemostasis. The uterus tubes and ovaries were all normal. Peritoneal cavity was irrigated vigorously.  The fascia was closed using 0 Vicryl in running fashion.  Subcutaneous tissue was made hemostatic and irrigated. The skin was closed using 4-0 Vicryl on a Keith needle in a subcuticular fashion.  Dermabond was placed for additional wound integrity and to serve as a barrier. Blood loss for the procedure was 550 cc. The patient received a gram of Ancef prophylactically. The patient was taken to the recovery room in good stable condition with all counts being correct x3.  EBL 550 cc  Lazaro Arms 04/02/2023 8:22 PM

## 2023-04-02 NOTE — Progress Notes (Signed)
Contacted at 1610 regarding [redacted]w[redacted]d G8P7 previous C/s x7 pt presenting to [facility] ED with contractions. Dr Despina Hidden reviewed tracing advised transport of pt to [facility]. Dr Hyacinth Meeker ED physician notified of recommendations and to arrange transport.   Luna Fuse

## 2023-04-02 NOTE — Transfer of Care (Signed)
Immediate Anesthesia Transfer of Care Note  Patient: Janet Dixon  Procedure(s) Performed: CESAREAN SECTION  Patient Location: PACU  Anesthesia Type:Spinal  Level of Consciousness: awake, alert , and oriented  Airway & Oxygen Therapy: Patient Spontanous Breathing  Post-op Assessment: Report given to RN and Post -op Vital signs reviewed and stable  Post vital signs: Reviewed and stable  Last Vitals:  Vitals Value Taken Time  BP 131/90 04/02/23 2030  Temp    Pulse 78 04/02/23 2032  Resp 17 04/02/23 2032  SpO2 98 % 04/02/23 2032  Vitals shown include unfiled device data.  Last Pain:  Vitals:   04/02/23 1802  TempSrc:   PainSc: 10-Worst pain ever         Complications: No notable events documented.

## 2023-04-02 NOTE — ED Provider Notes (Signed)
[facility] EMERGENC[facility] DEPARTMENT AT [facility] Provider Note   CSN: [id number] Arrival date & time: 04/02/23  1550     History  Chief Complaint  Patient presents with   Laboring    Janet Dixon is a 37 y.o. female.  HPI   This patient is a 37 year old female, she is G8, P7 at approximately 37 weeks, scheduled to be admitted on the 27th for an induced C-section.  The patient had a couple hours of pain coming on every 10 minutes or so and was concerned about labor.  She has no leakage of fluid, no bleeding, no chest pain or shortness of breath, abdomen is hurting at this time no dysuria.  She denies prior complicated pregnancies  Home Medications Prior to Admission medications   Medication Sig Start Date End Date Taking? Authorizing Provider  albuterol (VENTOLIN HFA) 108 (90 Base) MCG/ACT inhaler Inhale 2 puffs into the lungs every 6 (six) hours as needed for wheezing or shortness of breath. 12/19/22   Cheral Marker, CNM  metroNIDAZOLE (FLAGYL) 500 MG tablet Take 1 tablet (500 mg total) by mouth 2 (two) times daily. 03/23/23   Myna Hidalgo, DO  pantoprazole (PROTONIX) 20 MG tablet Take 20 mg by mouth daily. 03/17/23   [provider]  potassium chloride (K-DUR) 10 MEQ tablet Take 1 tablet (10 mEq total) by mouth 2 (two) times daily for 3 days. Patient not taking: Reported on 11/08/2018 08/05/18 01/09/19  Durward Parcel, DO      Allergies    Macrobid [nitrofurantoin macrocrystal], Ibuprofen, and Flexeril [cyclobenzaprine hcl]    Review of Systems   Review of Systems  All other systems reviewed and are negative.   Physical Exam Updated Vital Signs BP 107/73   Resp 19   Ht 1.524 m (5')   Wt 75 kg   LMP 06/29/2022   SpO2 100%   BMI 32.29 kg/m  Physical Exam Vitals and nursing note reviewed.  Constitutional:      General: She is not in acute distress.    Appearance: She is well-developed.  HENT:     Head: Normocephalic and atraumatic.      Mouth/Throat:     Pharynx: No oropharyngeal exudate.  Eyes:     General: No scleral icterus.       Right eye: No discharge.        Left eye: No discharge.     Conjunctiva/sclera: Conjunctivae normal.     Pupils: Pupils are equal, round, and reactive to light.  Neck:     Thyroid: No thyromegaly.     Vascular: No JVD.  Cardiovascular:     Rate and Rhythm: Normal rate and regular rhythm.     Heart sounds: Normal heart sounds. No murmur heard.    No friction rub. No gallop.  Pulmonary:     Effort: Pulmonary effort is normal. No respiratory distress.     Breath sounds: Normal breath sounds. No wheezing or rales.  Abdominal:     General: Bowel sounds are normal. There is distension.     Palpations: There is no mass.     Tenderness: There is no abdominal tenderness.     Comments: Gravid uterus, nontender, fetal movements palpated  Genitourinary:    Comments: Chaperone present for genitourinary exam, normal-appearing external genitalia, internal bimanual exam reveals a very high station, cervix is soft, posterior and does not appear to be dilated Musculoskeletal:        General: No tenderness. Normal range of  motion.     Cervical back: Normal range of motion and neck supple.  Lymphadenopathy:     Cervical: No cervical adenopathy.  Skin:    General: Skin is warm and dry.     Findings: No erythema or rash.  Neurological:     Mental Status: She is alert.     Coordination: Coordination normal.  Psychiatric:        Behavior: Behavior normal.     ED Results / Procedures / Treatments   Labs (all labs ordered are listed, but only abnormal results are displayed) Labs Reviewed  CBC - Abnormal; Notable for the following components:      Result Value   RBC 3.04 (*)    Hemoglobin 9.9 (*)    HCT 30.3 (*)    All other components within normal limits  COMPREHENSIVE METABOLIC PANEL    EKG None  Radiology No results found.  Procedures Procedures    Medications Ordered in  ED Medications - No data to display  ED Course/ Medical Decision Making/ A&P                                 Medical Decision Making Amount and/or Complexity of Data Reviewed Labs: ordered.  Risk Decision regarding hospitalization.   Patient appears to be in possible early labor, contractions seem to be about 10 minutes apart historically but will place on tocometry and discussed with OB/GYN.  Vital signs are reassuring, the patient has essentially nontender abdomen at this time.  Will obtain IV access and labs and discussed with OB/GYN  I discussed the care with the rapid response OB/GYN nurse and Dr. Despina Hidden with the OB/GYN service, they request that the patient be transferred immediately to the maternal admissions unit in labor and delivery at [facility].  [facility] called and requested immediate transport        Final Clinical Impression(s) / ED Diagnoses Final diagnoses:  Labor abnormal    Rx / DC Orders ED Discharge Orders     None         Eber Hong, MD 04/02/23 1623

## 2023-04-02 NOTE — ED Notes (Signed)
Spoke with Jeanmarie Plant, states he will come back up here to pick up his spouses belongings that was left when she was transported

## 2023-04-02 NOTE — Anesthesia Procedure Notes (Signed)
Epidural Patient location during procedure: OR Start time: 04/02/2023 7:18 PM End time: 04/02/2023 7:21 PM  Staffing Anesthesiologist: Beryle Lathe, MD Performed: anesthesiologist   Preanesthetic Checklist Completed: patient identified, IV checked, risks and benefits discussed, surgical consent, monitors and equipment checked, pre-op evaluation and timeout performed  Epidural Patient position: sitting Prep: DuraPrep Patient monitoring: continuous pulse ox and blood pressure Approach: midline Location: L2-L3 Injection technique: LOR air  Needle:  Needle type: Tuohy  Needle gauge: 17 G Needle length: 9 cm Needle insertion depth: 5 cm Catheter size: 19 Gauge Epidural test dose: No test dose given due to administration of intrathecal medication.  Assessment Events: blood not aspirated, injection not painful, no injection resistance, no paresthesia and negative IV test  Additional Notes Patient identified. Risks including, but not limited to, bleeding, infection, nerve damage, paralysis, inadequate analgesia, blood pressure changes, nausea, vomiting, allergic reaction, postpartum back pain, itching, and headache were discussed. Patient expressed understanding and wished to proceed. Sterile prep and drape, including hand hygiene, mask, and sterile gloves were used. The patient was positioned and the spine was prepped. The skin was anesthetized with lidocaine. The Tuohy was advanced until LOR was achieved. A 25ga Whitacre needle was advanced through the Tuohy. Free flow of clear CSF was obtained prior to injecting local anesthetic into the CSF. The spinal needle aspirated freely following injection. The spinal needle was carefully withdrawn. Unfortunately, was unable to pass the epidural catheter via the Tuohy needle. Decision made to abort epidural catheter placement to avoid poor setup of hyperbaric spinal. No epidural catheter placed. No paraesthesia or other complications noted.  The patient tolerated the procedure well.   Leslye Peer, MDReason for block:surgical anesthesia

## 2023-04-02 NOTE — H&P (Signed)
Preoperative History and Physical  Janet Dixon is a 37 y.o. [id number] with Patient's last menstrual period was 06/29/2022. admitted for a repeat Caesarean section, she presented to [facility] ED with c/o contractions since 1430 No bleeding or ROM .  She reports contractions every 2-5 minutes She had a prolonged decel in the [facility] ED and was transferred over immediately  PMH:    Past Medical History:  Diagnosis Date   Asthma    History of chlamydia    History of gonorrhea    History of trichomoniasis    Seizures (HCC)    Vaginal Pap smear, abnormal     PSH:     Past Surgical History:  Procedure Laterality Date   CESAREAN SECTION     CESAREAN SECTION N/A 12/02/2014   Procedure: REPEAT CESAREAN SECTION;  Surgeon: Tilda Burrow, MD;  Location: [facility] ORS;  Service: Obstetrics;  Laterality: N/A;   CESAREAN SECTION N/A 12/09/2017   Procedure: CESAREAN SECTION;  Surgeon: Tilda Burrow, MD;  Location: [facility] BIRTHING SUITES;  Service: Obstetrics;  Laterality: N/A;    POb/GynH:      OB History     Gravida  8   Para  7   Term  7   Preterm      AB  0   Living  7      SAB  0   IAB      Ectopic      Multiple  0   Live Births  7           SH:   Social History   Tobacco Use   Smoking status: Some Days    Current packs/day: 0.50    Average packs/day: 0.5 packs/day for 13.0 years (6.5 ttl pk-yrs)    Types: Cigarettes   Smokeless tobacco: Never   Tobacco comments:    1-2 per day  Vaping Use   Vaping status: Never Used  Substance Use Topics   Alcohol use: No    Comment: none x 1 month ago   Drug use: Yes    Types: Codeine, IV, Marijuana    Comment: marijuana on 12/20    FH:    Family History  Problem Relation Age of Onset   Hypertension Mother    Asthma Brother    Asthma Daughter    Eczema Daughter    Asthma Son    Eczema Son    Aneurysm Maternal Grandmother    Heart disease Maternal Grandmother    Cancer Maternal Grandmother    Asthma Daughter    Eczema  Daughter    Asthma Son    Eczema Son    Eczema Son    Cancer Maternal Grandfather        colon   Eczema Maternal Aunt      Allergies:  Allergies  Allergen Reactions   Macrobid [Nitrofurantoin Macrocrystal] Shortness Of Breath, Nausea And Vomiting and Other (See Comments)    dizziness   Ibuprofen Other (See Comments)    Patient states that syncope has occurred after taking this medication   Flexeril [Cyclobenzaprine Hcl] Rash    Medications:       Current Facility-Administered Medications:    0.9 %  sodium chloride infusion (Manually program via Guardrails IV Fluids), , Intravenous, Once, Ozan, Jennifer, DO   azithromycin (ZITHROMAX) 500 mg in sodium chloride 0.9 % 250 mL IVPB, 500 mg, Intravenous, 60 min Pre-Op, [place] Bing, MD   [START ON 04/03/2023] ceFAZolin (ANCEF) IVPB 2g/100 mL premix,  2 g, Intravenous, On Call to OR, Myna Hidalgo, DO   ceFAZolin (ANCEF) IVPB 2g/100 mL premix, 2 g, Intravenous, 30 min Pre-Op, [place] Bing, MD   lactated ringers infusion, , Intravenous, Continuous, Myna Hidalgo, DO, Last Rate: 40 mL/hr at 04/02/23 1814, New Bag at 04/02/23 1814   povidone-iodine 10 % swab 2 Application, 2 Application, Topical, Once, Ozan, Jennifer, DO   sodium citrate-citric acid (ORACIT) solution 30 mL, 30 mL, Oral, 30 min Pre-Op, [place] Bing, MD   tranexamic acid (CYKLOKAPRON) IVPB 1,000 mg, 1,000 mg, Intravenous, Once, Ozan, Jennifer, DO   tranexamic acid (CYKLOKAPRON) IVPB 1,000 mg, 1,000 mg, Intravenous, Once, [place] Bing, MD  Review of Systems:   Review of Systems  Constitutional: Negative for fever, chills, weight loss, malaise/fatigue and diaphoresis.  HENT: Negative for hearing loss, ear pain, nosebleeds, congestion, sore throat, neck pain, tinnitus and ear discharge.   Eyes: Negative for blurred vision, double vision, photophobia, pain, discharge and redness.  Respiratory: Negative for cough, hemoptysis, sputum production, shortness of  breath, wheezing and stridor.   Cardiovascular: Negative for chest pain, palpitations, orthopnea, claudication, leg swelling and PND.  Gastrointestinal: Positive for abdominal pain. Negative for heartburn, nausea, vomiting, diarrhea, constipation, blood in stool and melena.  Genitourinary: Negative for dysuria, urgency, frequency, hematuria and flank pain.  Musculoskeletal: Negative for myalgias, back pain, joint pain and falls.  Skin: Negative for itching and rash.  Neurological: Negative for dizziness, tingling, tremors, sensory change, speech change, focal weakness, seizures, loss of consciousness, weakness and headaches.  Endo/Heme/Allergies: Negative for environmental allergies and polydipsia. Does not bruise/bleed easily.  Psychiatric/Behavioral: Negative for depression, suicidal ideas, hallucinations, memory loss and substance abuse. The patient is not nervous/anxious and does not have insomnia.      PHYSICAL EXAM:  Blood pressure 134/81, pulse 84, temperature 98.1 F (36.7 C), temperature source Oral, resp. rate 18, height 5' (1.524 m), weight 75 kg, last menstrual period 06/29/2022, SpO2 100%.    Vitals reviewed. Constitutional: She is oriented to person, place, and time. She appears well-developed and well-nourished.  HENT:  Head: Normocephalic and atraumatic.  Right Ear: External ear normal.  Left Ear: External ear normal.  Nose: Nose normal.  Mouth/Throat: Oropharynx is clear and moist.  Eyes: Conjunctivae and EOM are normal. Pupils are equal, round, and reactive to light. Right eye exhibits no discharge. Left eye exhibits no discharge. No scleral icterus.  Neck: Normal range of motion. Neck supple. No tracheal deviation present. No thyromegaly present.  Cardiovascular: Normal rate, regular rhythm, normal heart sounds and intact distal pulses.  Exam reveals no gallop and no friction rub.   No murmur heard. Respiratory: Effort normal and breath sounds normal. No respiratory  distress. She has no wheezes. She has no rales. She exhibits no tenderness.  GI: Soft. Bowel sounds are normal. She exhibits no distension and no mass. There is tenderness. There is no rebound and no guarding.  Genitourinary:       Vulva is normal without lesions Vagina is pink moist without discharge Cervix normal in appearance and pap is normal Uterus is term size Adnexa is negative with normal sized ovaries by sonogram  Musculoskeletal: Normal range of motion. She exhibits no edema and no tenderness.  Neurological: She is alert and oriented to person, place, and time. She has normal reflexes. She displays normal reflexes. No cranial nerve deficit. She exhibits normal muscle tone. Coordination normal.  Skin: Skin is warm and dry. No rash noted. No erythema. No pallor.  Psychiatric: She  has a normal mood and affect. Her behavior is normal. Judgment and thought content normal.    Labs: Results for orders placed or performed during the hospital encounter of 04/02/23 (from the past 2 weeks)  CBC   Collection Time: 04/02/23  4:17 PM  Result Value Ref Range   WBC 7.7 4.0 - 10.5 K/uL   RBC 3.04 (L) 3.87 - 5.11 MIL/uL   Hemoglobin 9.9 (L) 12.0 - 15.0 g/dL   HCT 84.1 (L) 32.4 - 40.1 %   MCV 99.7 80.0 - 100.0 fL   MCH 32.6 26.0 - 34.0 pg   MCHC 32.7 30.0 - 36.0 g/dL   RDW 02.7 25.3 - 66.4 %   Platelets 239 150 - 400 K/uL   nRBC 0.0 0.0 - 0.2 %  Comprehensive metabolic panel   Collection Time: 04/02/23  4:17 PM  Result Value Ref Range   Sodium 136 135 - 145 mmol/L   Potassium 4.1 3.5 - 5.1 mmol/L   Chloride 108 98 - 111 mmol/L   CO2 19 (L) 22 - 32 mmol/L   Glucose, Bld 96 70 - 99 mg/dL   BUN 12 6 - 20 mg/dL   Creatinine, Ser 4.03 0.44 - 1.00 mg/dL   Calcium 9.0 8.9 - 47.4 mg/dL   Total Protein 6.2 (L) 6.5 - 8.1 g/dL   Albumin 2.6 (L) 3.5 - 5.0 g/dL   AST 18 15 - 41 U/L   ALT 16 0 - 44 U/L   Alkaline Phosphatase 185 (H) 38 - 126 U/L   Total Bilirubin 0.2 <1.2 mg/dL   GFR,  Estimated >25 >95 mL/min   Anion gap 9 5 - 15  Results for orders placed or performed in visit on 03/23/23 (from the past 2 weeks)  Cervicovaginal ancillary only( [place])   Collection Time: 03/23/23  4:33 PM  Result Value Ref Range   Neisseria Gonorrhea Negative    Chlamydia Negative    Comment Normal Reference Ranger Chlamydia - Negative    Comment      Normal Reference Range Neisseria Gonorrhea - Negative    EKG: Orders placed or performed during the hospital encounter of 04/02/23   EKG   EKG   EKG   EKG    Imaging Studies: US OB Follow Up Result Date: 03/27/2023 Table formatting from the original result was not included. Images from the original result were not included.  ..an Financial trader of Ultrasound Medicine Technical sales engineer) accredited practice Center for [facility] [address] [place] [address] Ordering Provider: Myna Hidalgo, DO FOLLOW UP SONOGRAM ALTA DEWILDE is in the office for a follow up sonogram for EFW. She is a 37 y.o. year old [phone] with Estimated Date of Delivery: 04/14/23 by early ultrasound now at  [redacted]w[redacted]d weeks gestation. Thus far the pregnancy has been complicated by AMA,hx of drug use,seizure disorder . GESTATION: SINGLETON PRESENTATION: cephalic FETAL ACTIVITY:          Heart rate         142          The fetus is active. AMNIOTIC FLUID: The amniotic fluid volume is  normal, 16 cm. PLACENTA LOCALIZATION:  anterior GRADE 3 CERVIX: Limited view ADNEXA: The ovaries are normal. GESTATIONAL AGE AND  BIOMETRICS: Gestational criteria: Estimated Date of Delivery: 04/14/23 by early ultrasound now at [redacted]w[redacted]d Previous Scans:2          BIPARIETAL DIAMETER  8.40 cm         33+6 weeks   2.7% HEAD CIRCUMFERENCE           32.80 cm         37+2 weeks    32% ABDOMINAL CIRCUMFERENCE           32.79 cm         36+5 weeks    59% FEMUR LENGTH           7.01 cm         36    25% weeks                                                       AVERAGE  EGA(BY THIS SCAN):  36 weeks                                                 ESTIMATED FETAL WEIGHT:       2886  grams, 39 % ANATOMICAL SURVEY                                                                            COMMENTS CEREBRAL VENTRICLES yes normal  CHOROID PLEXUS yes normal  CEREBELLUM yes normal  CISTERNA MAGNA  Yes  normal   CAVUM SEPTI PELLUCIDI YES NORMAL              NOSE/LIP yes normal  FACIAL PROFILE yes normal  4 CHAMBERED HEART yes normal  OUTFLOW TRACTS YES normaL  3VV YES NORMAL  3VTV YES NORMAL  SITUS YES NORMAL      DIAPHRAGM yes normal  STOMACH yes normal  RENAL REGION yes normal  BLADDER yes normal          3 VESSEL CORD yes normal              GENITALIA yes normal female     SUSPECTED ABNORMALITIES:  yes QUALITY OF SCAN: satisfactory TECHNICIAN COMMENTS: [name] 36+6 wks,cephalic,dolichocephaly,FHR 142 bpm,AFI 16 cm,anterior placenta gr 3,EFW 2886 g 39%,BPD 2.7%,HC 32% A copy of this report including all images has been saved and backed up to a second source for retrieval if needed. All measures and details of the anatomical scan, placentation, fluid volume and pelvic anatomy are contained in that report. Amber Flora Lipps 03/23/2023 4:31 PM Clinical Impression and recommendations: I have reviewed the sonogram results above, combined with the patient's current clinical course, below are my impressions and any appropriate recommendations for management based on the sonographic findings. 1.  [id number] Estimated Date of Delivery: 04/14/23 by serial sonographic evaluations 2.  Fetal sonographic surveillance findings: a). Normal fluid volume b). 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  Will plan to follow as neonate. 3.  Normal general sonographic findings Recommend continued prenatal evaluations and care based on this sonogram and as clinically indicated from the patient's  clinical course. Myna Hidalgo, DO Attending Obstetrician &  Gynecologist, [facility] for [facility], [facility]       Assessment:  [id number] [redacted]w[redacted]d Estimated Date of Delivery: 04/14/23  Previous C section x 7, declines tubal ligation Consents for Mirena post placental IUD placement  Patient Active Problem List   Diagnosis Date Noted   History of cesarean delivery, currently pregnant 04/02/2023   GBS bacteriuria 02/23/2023   History of illicit drug use 01/19/2023   Abnormal Pap smear of cervix 12/19/2022   ASB (asymptomatic bacteriuria) 11/16/2022   AMA (advanced maternal age) multigravida 35+ 11/09/2022   Proctocolitis 02/18/2019   Pyelonephritis 08/04/2018   Elevated lipase 04/14/2017   History of C-section 04/14/2017   Trichomoniasis 08/29/2016   GC (gonococcus infection) 07/30/2014   High-risk pregnancy 07/22/2014   Seizure disorder (HCC) 05/01/2014   Cocaine abuse (HCC) 03/14/2014    Plan: Repeat Caesarean section, placement of post placental IUD  Pt understands the risks of surgery including but not limited t  excessive bleeding requiring transfusion or reoperation, post-operative infection requiring prolonged hospitalization or re-hospitalization and antibiotic therapy, and damage to other organs including bladder, bowel, ureters and major vessels.  The patient also understands the alternative treatment options which were discussed in full.  All questions were answered.  Lazaro Arms 04/02/2023 6:23 PM   Lazaro Arms 04/02/2023 6:22 PM

## 2023-04-02 NOTE — MAU Note (Signed)
.  Janet Dixon is a 37 y.o. at [redacted]w[redacted]d here in MAU reporting: patient arrived via [facility] from [facility] ER after signing in for ctx. Dr. Despina Hidden decided that she would most likely go to the OR. H/o of 7 prior cesareans - planning repeat. Cervix closed at [facility].   EMS reports contractions q 6 mintues. Patient arrived with 20g in Left-AC.  FHT: 125 bpm Lab orders placed from triage: Standard admission labs, per verbal order from Dr. Despina Hidden

## 2023-04-02 NOTE — Anesthesia Preprocedure Evaluation (Addendum)
Anesthesia Evaluation  Patient identified by MRN, date of birth, ID band Patient awake    Reviewed: Allergy & Precautions, NPO status , Patient's Chart, lab work & pertinent test results  History of Anesthesia Complications Negative for: history of anesthetic complications  Airway Mallampati: II  TM Distance: >3 FB Neck ROM: Full    Dental  (+) Dental Advisory Given   Pulmonary asthma , Current Smoker and Patient abstained from smoking.   Pulmonary exam normal        Cardiovascular negative cardio ROS Normal cardiovascular exam     Neuro/Psych Seizures -, Well Controlled,   negative psych ROS   GI/Hepatic negative GI ROS,,,(+)     substance abuse  cocaine use, marijuana use and IV drug use  Endo/Other   Obesity   Renal/GU negative Renal ROS     Musculoskeletal negative musculoskeletal ROS (+)    Abdominal   Peds  Hematology  (+) Blood dyscrasia, anemia  Plt 267k    Anesthesia Other Findings +uTox for cocaine and THC - patient admits to recent marijuana use but adamantly denies any cocaine use within the past month   Reproductive/Obstetrics (+) Pregnancy  Hx 7 previous C/S                              Anesthesia Physical Anesthesia Plan  ASA: 3 and emergent  Anesthesia Plan: Combined Spinal and Epidural   Post-op Pain Management: Tylenol PO (pre-op)*   Induction:   PONV Risk Score and Plan: 1 and Treatment may vary due to age or medical condition, Ondansetron and Scopolamine patch - Pre-op  Airway Management Planned: Natural Airway  Additional Equipment: None  Intra-op Plan:   Post-operative Plan:   Informed Consent: I have reviewed the patients History and Physical, chart, labs and discussed the procedure including the risks, benefits and alternatives for the proposed anesthesia with the patient or authorized representative who has indicated his/her understanding  and acceptance.       Plan Discussed with: CRNA, Anesthesiologist and Surgeon  Anesthesia Plan Comments: (Labs reviewed, platelets acceptable. Discussed risks and benefits of spinal, including spinal/epidural hematoma, infection, failed block, and PDPH. Patient expressed understanding and wished to proceed.  Inadequate NPO status discussed with OB. OB states that it would not be safe to wait for adequate NPO time given episodes of fetal bradycardia.)       Anesthesia Quick Evaluation

## 2023-04-02 NOTE — Anesthesia Postprocedure Evaluation (Signed)
Anesthesia Post Note  Patient: Janet Dixon  Procedure(s) Performed: CESAREAN SECTION     Patient location during evaluation: PACU Anesthesia Type: Spinal Level of consciousness: awake and alert Pain management: pain level controlled Vital Signs Assessment: post-procedure vital signs reviewed and stable Respiratory status: spontaneous breathing and respiratory function stable Cardiovascular status: blood pressure returned to baseline and stable Postop Assessment: spinal receding and no apparent nausea or vomiting Anesthetic complications: no   No notable events documented.  Last Vitals:  Vitals:   04/02/23 2105 04/02/23 2115  BP:  (!) 130/94  Pulse: 72 71  Resp: 15 14  Temp: (!) 36.3 C   SpO2: 97% 97%     Pain Goal:    LLE Motor Response: Purposeful movement (04/02/23 2050) LLE Sensation: Numbness (04/02/23 2050) RLE Motor Response: Purposeful movement (04/02/23 2050) RLE Sensation: Numbness (04/02/23 2050)     Epidural/Spinal Function Cutaneous sensation: Tingles (04/02/23 2105), Patient able to flex knees: Yes (04/02/23 2105), Patient able to lift hips off bed: Yes (04/02/23 2105), Back pain beyond tenderness at insertion site: No (04/02/23 2105), Progressively worsening motor and/or sensory loss: No (04/02/23 2105), Bowel and/or bladder incontinence post epidural: No (04/02/23 2105)  Beryle Lathe

## 2023-04-02 NOTE — Progress Notes (Signed)
Notified Dr. Judd Lien of pt's BPs since arriving to [facility]. BP 136/95 @ 2135, 118/96 @ 2231. Pt asymptomatic, c/o pain 8-9 with fundal assessments. Per Dr. Judd Lien will monitor for now as pt's UDS + cocaine. RN to notify MD if after midnight BPs >= 135/85.

## 2023-04-03 ENCOUNTER — Encounter (HOSPITAL_COMMUNITY): Payer: Self-pay | Admitting: Obstetrics and Gynecology

## 2023-04-03 ENCOUNTER — Other Ambulatory Visit (HOSPITAL_COMMUNITY): Payer: Self-pay

## 2023-04-03 LAB — CBC
HCT: 32.1 % — ABNORMAL LOW (ref 36.0–46.0)
Hemoglobin: 10.5 g/dL — ABNORMAL LOW (ref 12.0–15.0)
MCH: 32.1 pg (ref 26.0–34.0)
MCHC: 32.7 g/dL (ref 30.0–36.0)
MCV: 98.2 fL (ref 80.0–100.0)
Platelets: 248 10*3/uL (ref 150–400)
RBC: 3.27 MIL/uL — ABNORMAL LOW (ref 3.87–5.11)
RDW: 13.1 % (ref 11.5–15.5)
WBC: 15.1 10*3/uL — ABNORMAL HIGH (ref 4.0–10.5)
nRBC: 0 % (ref 0.0–0.2)

## 2023-04-03 LAB — RPR: RPR Ser Ql: NONREACTIVE

## 2023-04-03 MED ORDER — OXYCODONE HCL 5 MG PO TABS
5.0000 mg | ORAL_TABLET | Freq: Four times a day (QID) | ORAL | Status: DC | PRN
Start: 2023-04-03 — End: 2023-04-05
  Administered 2023-04-03: 5 mg via ORAL
  Administered 2023-04-04 – 2023-04-05 (×4): 10 mg via ORAL
  Filled 2023-04-03 (×2): qty 2
  Filled 2023-04-03: qty 1
  Filled 2023-04-03 (×2): qty 2

## 2023-04-03 MED ORDER — OXYCODONE-ACETAMINOPHEN 5-325 MG PO TABS
1.0000 | ORAL_TABLET | ORAL | 0 refills | Status: AC | PRN
  Filled 2023-04-03: qty 28, 5d supply, fill #0

## 2023-04-03 MED ORDER — ACETAMINOPHEN 500 MG PO TABS
1000.0000 mg | ORAL_TABLET | Freq: Four times a day (QID) | ORAL | Status: DC
Start: 2023-04-03 — End: 2023-04-05
  Administered 2023-04-03 – 2023-04-05 (×9): 1000 mg via ORAL
  Filled 2023-04-03 (×9): qty 2

## 2023-04-03 NOTE — Social Work (Signed)
CSW escorted [facility] CPS SW Mia to [name]'s room. Barriers to infants discharge.  Wende Neighbors, LCSWA Clinical Social Worker [phone]

## 2023-04-03 NOTE — Clinical Social Work Maternal (Signed)
CLINICAL SOCIAL WORK MATERNAL/CHILD NOTE  Patient Details  Name: Janet Dixon MRN: [id number] Date of Birth: 08-Nov-1985  Date:  04/03/2023  Clinical Social Worker Initiating Note:  Willaim Rayas Markea Ruzich Date/Time: Initiated:  04/03/23/1506     Child's Name:  Placido Sou 04/02/2023   Biological Parents:  Mother, Father Arlie Frohn [birth year] 07[birth year] 1986, Jeanmarie Plant 10/18/1971)   Need for Interpreter:  None   Reason for Referral:  Current Substance Use/Substance Use During Pregnancy     Address:  [address] [place] [address]    Phone number:  [phone] (home)     Additional phone number:   Household Members/Support Persons (HM/SP):   Household Member/Support Person 1   HM/SP Name Relationship DOB or Age  HM/SP -1 Jeanmarie Plant FOB 10/18/1971  HM/SP -2        HM/SP -3        HM/SP -4        HM/SP -5        HM/SP -6        HM/SP -7        HM/SP -8          Natural Supports (not living in the home):      Professional Supports: None   Employment: Unemployed   Type of Work:     Education:  9 to 11 years   Homebound arranged: No  Financial Resources:  [employer]   Other Resources:  [employer]   Cultural/Religious Considerations Which May Impact Care:    Strengths:  Ability to meet basic needs  , Home prepared for child  , Pediatrician chosen   Psychotropic Medications:         Pediatrician:    [place]  Pediatrician List:   [employer]    [place]    [facility]  [facility]      Pediatrician Fax Number:    Risk Factors/Current Problems:  Substance Use  , DHHS Involvement     Cognitive State:  Able to Concentrate     Mood/Affect:  Comfortable     CSW Assessment: CSW received a consult for Substance use during pregnancy. CSW met with [name] to complete assessment and offer support. CSW entered the room and observed [name] sitting up on the bed and the infant in the  bassinet. CSW introduced self, CSW role and reason for visit [name] was agreeable to visit and allowed FOB to remain on the phone during the assessment. CSW inquired about how [name] was feeling, [name] reported good. CSW verified [name]'s address and phone number, [name] confirmed the address and phone number.  CSW inquired about [name] MH hx, [name] denied any MH concerns. CSW CSW provided education regarding the baby blues period vs. perinatal mood disorders, discussed treatment and gave resources for mental health follow up if concerns arise.  CSW recommends self-evaluation during the postpartum time period using the New Mom Checklist from Postpartum Progress and encouraged [name] to contact a medical professional if symptoms are noted at any time. CSW inquired abut [name] noted substance use during pregnancy, [name] reported she last used cocaine around 03/15/23 and last used THC prior to coming to the hospital. CSW notified [name] her UDS was positive for Cocaine and THC [name] reported she does not understand whey she keeps testing positive for cocaine. CSW reported she has struggled with substance use for awhile, CSW offered inpatient and outpatient substance use resources, [name] gratefully accepted the resources.  CSW informed [name] of the hospital drug screen policy, [name] verbalized understanding. CSW informed [name] infant UDS and CDS were pending, [name] verbalized understanding. CSW inquired about CPS hx, [name] reported her 3 youngest children are in CPS custody. CSW made a CPS report based off [name] positive drug screens and prior CPS hx.    CSW provided review of Sudden Infant Death Syndrome (SIDS) precautions.  CSW reported she has all necessary items for the infant including a mini crib and car seat.  Barriers to infants discharge at this time.  CSW Plan/Description:  No Further Intervention Required/No Barriers to Discharge, Psychosocial Support and Ongoing Assessment of Needs, Sudden Infant Death Syndrome (SIDS) Education, Perinatal Mood and  Anxiety Disorder (PMADs) Education, Neonatal Abstinence Syndrome (NAS) Education, Hospital Drug Screen Policy Information, Child Protective Service Report  , CSW Will Continue to Monitor Umbilical Cord Tissue Drug Screen Results and Make Report if [place], CSW Awaiting CPS Disposition Plan    Maud Deed, LCSW 04/03/2023, 3:16 PM

## 2023-04-03 NOTE — Lactation Note (Signed)
This note was copied from a baby's chart. Lactation Consultation Note  Patient Name: Janet Dixon [id number] Date: 04/03/2023 Age:36 hours  Mom is positive for Cocaine. BF not recommended.   Maternal Data    Feeding    LATCH Score Latch: Grasps breast easily, tongue down, lips flanged, rhythmical sucking.  Audible Swallowing: A few with stimulation  Type of Nipple: Everted at rest and after stimulation  Comfort (Breast/Nipple): Soft / non-tender  Hold (Positioning): No assistance needed to correctly position infant at breast.  LATCH Score: 9   Lactation Tools Discussed/Used    Interventions    Discharge    Consult Status Consult Status: Complete    Emonni Depasquale G 04/03/2023, 1:11 AM

## 2023-04-03 NOTE — Progress Notes (Signed)
Called to update Dr. Judd Lien on pt's BPs. Dr. Judd Lien requested secure chat to be sent and RN did that. BPs with orthostatic VS at 0041 lying 137/93, sitting 149/102, standing 129/109. Pt was very tense with standing, c/o dizziness. BP reassessed when assisted back into bed 147/93 @ 0058. Per Dr. Judd Lien, give methergine only if issues with bleeding and notify MD before giving. No meds for BP at this time. Tylenol order requested and received.

## 2023-04-03 NOTE — Social Work (Signed)
Per CPS infant must be removed from [name]'s room. Infant will go to nursery once CPS has discussed safety plan with [name] and FOB. Per CPS [name] and FOB are allowed to visit infant in the nursery. Barriers to infant discharge.  Janet Dixon, LCSWA Clinical Social Worker [phone]

## 2023-04-03 NOTE — Progress Notes (Signed)
Subjective: Postpartum Day 1: Cesarean Delivery, 8th C section Patient reports incisional pain and tolerating PO.    Objective: Vital signs in last 24 hours: Temp:  [97.3 F (36.3 C)-98.5 F (36.9 C)] 98.5 F (36.9 C) (12/23 0830) Pulse Rate:  [69-92] 85 (12/23 0830) Resp:  [13-22] 20 (12/23 0830) BP: (107-147)/(63-96) 122/82 (12/23 0830) SpO2:  [95 %-100 %] 98 % (12/23 0830) Weight:  [75 kg] 75 kg (12/22 1614)  Physical Exam:  General: alert, cooperative, and no distress Lochia: appropriate Uterine Fundus: firm Incision: healing well, no significant drainage, no dehiscence, no significant erythema DVT Evaluation: No evidence of DVT seen on physical exam.  Recent Labs    04/02/23 1812 04/03/23 0445  HGB 10.4* 10.5*  HCT 31.8* 32.1*    Assessment/Plan: Status post Cesarean section. Doing well postoperatively.  Continue current care Discharge 12/24 vs 12/25  Rx already sent to [facility] rx.: percocet, has reaction to motrin  Lazaro Arms, MD 04/03/2023, 9:31 AM

## 2023-04-04 ENCOUNTER — Other Ambulatory Visit (HOSPITAL_COMMUNITY): Payer: Self-pay

## 2023-04-04 LAB — SURGICAL PATHOLOGY

## 2023-04-04 NOTE — Progress Notes (Signed)
Post-Op Day 2, repeat CS for hx 7 prior CS  Subjective: No complaints, up ad lib, voiding and tolerating PO, passing flatus,small lochia,.plans to bottle feed,  Mirnea IUD inserted at CS  Objective: Blood pressure 122/67, pulse 78, temperature 98.3 F (36.8 C), resp. rate 18, height 5' (1.524 m), weight 75 kg, last menstrual period 06/29/2022, SpO2 100%, unknown if currently breastfeeding.  Physical Exam:  General: alert, cooperative and no distress Lochia:normal flow Chest: CTAB Heart: RRR no m/r/g Abdomen: +BS, soft, nontender, dsg c/d/intact, no erythema Uterine Fundus: firm DVT Evaluation: No evidence of DVT seen on physical exam. Extremities: no edema  Recent Labs    04/02/23 1812 04/03/23 0445  HGB 10.4* 10.5*  HCT 31.8* 32.1*    Assessment/Plan: Plan for discharge tomorrow and Social Work consult--barriers to infant's DC, in CPS custody   LOS: 2 days   Jacklyn Shell 04/04/2023, 6:24 AM

## 2023-04-05 LAB — TYPE AND SCREEN
ABO/RH(D): O POS
Antibody Screen: NEGATIVE
Unit division: 0
Unit division: 0

## 2023-04-05 LAB — BPAM RBC
Blood Product Expiration Date: 202501182359
Blood Product Expiration Date: 202501182359
Unit Type and Rh: 5100
Unit Type and Rh: 5100

## 2023-04-05 MED ORDER — GABAPENTIN 300 MG PO CAPS: 300.0000 mg | ORAL_CAPSULE | Freq: Three times a day (TID) | ORAL | 0 refills | Status: AC

## 2023-04-05 MED ORDER — DOCUSATE SODIUM 100 MG PO CAPS: 100.0000 mg | ORAL_CAPSULE | Freq: Two times a day (BID) | ORAL | 0 refills | Status: AC

## 2023-04-05 MED ORDER — ACETAMINOPHEN 500 MG PO TABS: 1000.0000 mg | ORAL_TABLET | Freq: Three times a day (TID) | ORAL | 0 refills | Status: AC | PRN

## 2023-04-05 NOTE — Discharge Summary (Signed)
Postpartum Discharge Summary  Date of Service updated-12/25     Patient Name: Janet Dixon DOB: April 23, 1985 MRN: [id number]  Date of admission: 04/02/2023 Delivery date:04/02/2023 Delivering provider: Lazaro Arms Date of discharge: 04/05/2023  Admitting diagnosis: Labor abnormal [O62.9] History of cesarean delivery, currently pregnant [O34.219] S/P cesarean section [Z98.891] Intrauterine pregnancy [Z34.90] Intrauterine pregnancy: [redacted]w[redacted]d     Secondary diagnosis:  Principal Problem:   History of cesarean delivery, currently pregnant Active Problems:   S/P cesarean section   Intrauterine pregnancy Contraceptive management  Additional problems: +cocaine use in pregnancy, prior C-section x 7    Discharge diagnosis: Term Pregnancy Delivered                                              Post partum procedures: none Augmentation: N/A Complications: None  Hospital course: Onset of Labor With Planned C-section 37 y.o. yo [id number] at [redacted]w[redacted]d was admitted in Latent Labor on 04/02/2023. The patient went for cesarean section due to  prior C-section x 7 . Delivery details as follows: Membrane Rupture Time/Date: 7:40 PM,04/02/2023  Delivery Method:C-Section, Low Transverse Operative Delivery:N/A Details of operation can be found in separate operative note. Patient had a postpartum course that was uncomplicated.  She is ambulating,tolerating a regular diet, passing flatus, and urinating well.  Patient is discharged home in stable condition 04/05/23.  Newborn Data: Birth date:04/02/2023 Birth time:7:40 PM Gender:Female Living status:Living Apgars:8 ,9  Weight:2920 g  Magnesium Sulfate received: No BMZ received: No Rhophylac:No MMR:No T-DaP:Given prenatally Flu: No RSV Vaccine received: No Transfusion:No  Immunizations received: Immunization History  Administered Date(s) Administered   Influenza,inj,Quad PF,6+ Mos 06/08/2017   Tdap 12/04/2014, 02/23/2023    Physical  exam  Vitals:   04/04/23 0521 04/04/23 1720 04/04/23 2007 04/05/23 0600  BP: 122/67 118/77 130/76 124/69  Pulse: 78 86 82 83  Resp: 18 18 18 18   Temp: 98.3 F (36.8 C) 98.9 F (37.2 C) 98.2 F (36.8 C) 98.2 F (36.8 C)  TempSrc:  Oral Oral Oral  SpO2:  99% 100% 100%  Weight:      Height:       General: alert, cooperative, and no distress Lochia: appropriate Uterine Fundus: firm Incision: Dressing is clean, dry, and intact DVT Evaluation: No evidence of DVT seen on physical exam. Labs: Lab Results  Component Value Date   WBC 15.1 (H) 04/03/2023   HGB 10.5 (L) 04/03/2023   HCT 32.1 (L) 04/03/2023   MCV 98.2 04/03/2023   PLT 248 04/03/2023      Latest Ref Rng & Units 04/02/2023    4:17 PM  CMP  Glucose 70 - 99 mg/dL 96   BUN 6 - 20 mg/dL 12   Creatinine 1.47 - 1.00 mg/dL 8.29   Sodium 562 - 130 mmol/L 136   Potassium 3.5 - 5.1 mmol/L 4.1   Chloride 98 - 111 mmol/L 108   CO2 22 - 32 mmol/L 19   Calcium 8.9 - 10.3 mg/dL 9.0   Total Protein 6.5 - 8.1 g/dL 6.2   Total Bilirubin <8.6 mg/dL 0.2   Alkaline Phos 38 - 126 U/L 185   AST 15 - 41 U/L 18   ALT 0 - 44 U/L 16    Edinburgh Score:    04/04/2023    5:21 PM  Edinburgh Postnatal Depression Scale Screening Tool  I have  been able to laugh and see the funny side of things. 0  I have looked forward with enjoyment to things. 0  I have blamed myself unnecessarily when things went wrong. 1  I have been anxious or worried for no good reason. 2  I have felt scared or panicky for no good reason. 1  Things have been getting on top of me. 1  I have been so unhappy that I have had difficulty sleeping. 1  I have felt sad or miserable. 1  I have been so unhappy that I have been crying. 1  The thought of harming myself has occurred to me. 0  Edinburgh Postnatal Depression Scale Total 8   Edinburgh Postnatal Depression Scale Total: 8   After visit meds:  Allergies as of 04/05/2023       Reactions   Macrobid  [nitrofurantoin Macrocrystal] Shortness Of Breath, Nausea And Vomiting, Other (See Comments)   dizziness   Ibuprofen Other (See Comments)   Patient states that syncope has occurred after taking this medication   Flexeril [cyclobenzaprine Hcl] Rash        Medication List     STOP taking these medications    pantoprazole 20 MG tablet Commonly known as: PROTONIX       TAKE these medications    acetaminophen 500 MG tablet Commonly known as: TYLENOL Take 2 tablets (1,000 mg total) by mouth every 8 (eight) hours as needed.   albuterol 108 (90 Base) MCG/ACT inhaler Commonly known as: VENTOLIN HFA Inhale 2 puffs into the lungs every 6 (six) hours as needed for wheezing or shortness of breath.   docusate sodium 100 MG capsule Commonly known as: Colace Take 1 capsule (100 mg total) by mouth 2 (two) times daily for 20 days.   gabapentin 300 MG capsule Commonly known as: Neurontin Take 1 capsule (300 mg total) by mouth 3 (three) times daily for 4 days. For pain management   metroNIDAZOLE 500 MG tablet Commonly known as: FLAGYL Take 1 tablet (500 mg total) by mouth 2 (two) times daily.   multivitamin-prenatal 27-0.8 MG Tabs tablet Take 1 tablet by mouth daily at 12 noon.   oxyCODONE-acetaminophen 5-325 MG tablet Commonly known as: Percocet Take 1 tablet by mouth every 4 (four) hours as needed for severe pain (pain score 7-10).         Discharge home in stable condition Infant Feeding: Breast Infant Disposition: in nursery Discharge instruction: per After Visit Summary and Postpartum booklet. Activity: Advance as tolerated. Pelvic rest for 6 weeks.  Diet: routine diet Future Appointments: Future Appointments  Date Time Provider Department Center  04/11/2023  9:50 AM Cheral Marker, CNM [facility]  05/09/2023 10:30 AM Cheral Marker, CNM [facility]   Follow up Visit:  Follow-up Information     [facility] Follow up in 1 week(s).   Specialty: Obstetrics and Gynecology Why: Follow up in one week for an incision check Contact information: [address] [place] [address] [phone]                 Please schedule this patient for a In person postpartum visit in 1 week with the following provider: Any provider. Additional Postpartum F/U:Incision check 1 week  Low risk pregnancy complicated by:  polysubstance abuse Delivery mode:  C-Section, Low Transverse Anticipated Birth Control:  PP IUD placed   04/05/2023 Sharon Seller, DO

## 2023-04-05 NOTE — Progress Notes (Signed)
  CSW received notice from pediatrician that the infant is medically stable to be discharged.   Support Staff notified (TSP) Janet Dixon of the discharge plan for the infant.   CSW notified [facility] CPS emergency line of the discharge plan for the infant. The social worker on call notified CSW that a home assessment has been completed and the infant is safe to be discharge with Janet Dixon.   (TSP Temporary Safety Provider) Janet Dixon [address] [place] [address]   A copy of the TSP's ID has been placed in the chart for the infant.   CSW identifies no further need for intervention and no barriers to discharge at this time.   Enos Fling, Theresia Majors Clinical Social Worker [phone]

## 2023-04-06 ENCOUNTER — Other Ambulatory Visit: Payer: Self-pay

## 2023-04-06 ENCOUNTER — Encounter (HOSPITAL_COMMUNITY)
Admission: RE | Admit: 2023-04-06 | Discharge: 2023-04-06 | Disposition: A | Discharge status: Home / Self Care | Payer: Medicaid Other | Source: Ambulatory Visit | Attending: Obstetrics and Gynecology | Admitting: Obstetrics and Gynecology

## 2023-04-06 ENCOUNTER — Emergency Department (HOSPITAL_COMMUNITY)
Admission: EM | Admit: 2023-04-06 | Discharge: 2023-04-06 | Disposition: A | Discharge status: Home / Self Care | Payer: Medicaid Other | Attending: Emergency Medicine | Admitting: Emergency Medicine

## 2023-04-06 ENCOUNTER — Encounter (HOSPITAL_COMMUNITY): Payer: Self-pay

## 2023-04-06 ENCOUNTER — Other Ambulatory Visit (HOSPITAL_COMMUNITY): Payer: Self-pay

## 2023-04-06 DIAGNOSIS — K047 Periapical abscess without sinus: Secondary | ICD-10-CM | POA: Diagnosis not present

## 2023-04-06 DIAGNOSIS — K0889 Other specified disorders of teeth and supporting structures: Secondary | ICD-10-CM | POA: Diagnosis present

## 2023-04-06 MED ORDER — AMOXICILLIN-POT CLAVULANATE 875-125 MG PO TABS: 1.0000 | ORAL_TABLET | Freq: Two times a day (BID) | ORAL | 0 refills | Status: AC

## 2023-04-06 NOTE — Discharge Instructions (Addendum)
Today you are seen for a dental infection.  Please pick up your antibiotic and take as prescribed.  May also take Tylenol as needed for pain.  Please follow-up with the dentist soon as possible for further evaluation and workup.  Thank you for letting us treat you today. After performing a physical exam I feel you are safe to go home. Please follow up with your PCP in the next several days and provide them with your records from this visit. Return to the Emergency Room if pain becomes severe or symptoms worsen.

## 2023-04-06 NOTE — ED Provider Notes (Signed)
[place] EMERGENCY DEPARTMENT AT [facility] Provider Note   CSN: [id number] Arrival date & time: 04/06/23  1624     History  Chief Complaint  Patient presents with   Dental Pain    Janet Dixon is a 37 y.o. female presents for right upper tooth pain and swelling x 1 day.  Patient denies fever, chills, sore throat, shortness of breath, or any other issue at this time.  Patient states that she did give birth on 04/02/2023 and is currently taking Percocet.   Dental Pain      Home Medications Prior to Admission medications   Medication Sig Start Date End Date Taking? Authorizing Provider  amoxicillin-clavulanate (AUGMENTIN) 875-125 MG tablet Take 1 tablet by mouth every 12 (twelve) hours. 04/06/23  Yes Dolphus Jenny, PA-C  acetaminophen (TYLENOL) 500 MG tablet Take 2 tablets (1,000 mg total) by mouth every 8 (eight) hours as needed. 04/05/23   Myna Hidalgo, DO  albuterol (VENTOLIN HFA) 108 (90 Base) MCG/ACT inhaler Inhale 2 puffs into the lungs every 6 (six) hours as needed for wheezing or shortness of breath. 12/19/22   Cheral Marker, CNM  docusate sodium (COLACE) 100 MG capsule Take 1 capsule (100 mg total) by mouth 2 (two) times daily for 20 days. 04/05/23 04/25/23  Myna Hidalgo, DO  gabapentin (NEURONTIN) 300 MG capsule Take 1 capsule (300 mg total) by mouth 3 (three) times daily for 4 days. For pain management 04/05/23 04/09/23  Myna Hidalgo, DO  metroNIDAZOLE (FLAGYL) 500 MG tablet Take 1 tablet (500 mg total) by mouth 2 (two) times daily. 03/23/23   Myna Hidalgo, DO  oxyCODONE-acetaminophen (PERCOCET) 5-325 MG tablet Take 1 tablet by mouth every 4 (four) hours as needed for severe pain (pain score 7-10). 04/03/23   Lazaro Arms, MD  Prenatal Vit-Fe Fumarate-FA (MULTIVITAMIN-PRENATAL) 27-0.8 MG TABS tablet Take 1 tablet by mouth daily at 12 noon.    [provider]  potassium chloride (K-DUR) 10 MEQ tablet Take 1 tablet (10 mEq total) by mouth  2 (two) times daily for 3 days. Patient not taking: Reported on 11/08/2018 08/05/18 01/09/19  Durward Parcel, DO      Allergies    Macrobid [nitrofurantoin macrocrystal], Ibuprofen, and Flexeril [cyclobenzaprine hcl]    Review of Systems   Review of Systems  HENT:  Positive for dental problem.     Physical Exam Updated Vital Signs BP 124/66 (BP Location: Right Arm)   Pulse 89   Temp 98.9 F (37.2 C) (Oral)   Resp 16   Ht 5' (1.524 m)   Wt 71.2 kg   LMP 04/02/2023   SpO2 97%   Breastfeeding No   BMI 30.66 kg/m  Physical Exam Vitals and nursing note reviewed.  Constitutional:      General: She is not in acute distress.    Appearance: Normal appearance. She is well-developed.  HENT:     Head: Normocephalic and atraumatic.     Nose: Nose normal.     Mouth/Throat:     Mouth: Mucous membranes are moist.     Dentition: Gingival swelling present. No dental abscesses.     Pharynx: Uvula midline.     Tonsils: No tonsillar exudate or tonsillar abscesses.      Comments: Missing teeth noted above with gingival erythema and swelling, there is some minimal purulent drainage without obvious abscess Eyes:     Extraocular Movements: Extraocular movements intact.     Conjunctiva/sclera: Conjunctivae normal.  Cardiovascular:  Rate and Rhythm: Normal rate and regular rhythm.     Pulses: Normal pulses.     Heart sounds: Normal heart sounds. No murmur heard. Pulmonary:     Effort: Pulmonary effort is normal. No respiratory distress.     Breath sounds: Normal breath sounds.  Abdominal:     Palpations: Abdomen is soft.     Tenderness: There is no abdominal tenderness.  Musculoskeletal:        General: No swelling. Normal range of motion.     Cervical back: Normal range of motion and neck supple.  Skin:    General: Skin is warm and dry.     Capillary Refill: Capillary refill takes less than 2 seconds.  Neurological:     General: No focal deficit present.     Mental Status: She is  alert.     Motor: No weakness.  Psychiatric:        Mood and Affect: Mood normal.     ED Results / Procedures / Treatments   Labs (all labs ordered are listed, but only abnormal results are displayed) Labs Reviewed - No data to display  EKG None  Radiology No results found.  Procedures Procedures    Medications Ordered in ED Medications - No data to display  ED Course/ Medical Decision Making/ A&P                                 Medical Decision Making Risk Prescription drug management.   This patient presents to the ED with chief complaint(s) of dental pain with pertinent past medical history of none which further complicates the presenting complaint. The complaint involves an extensive differential diagnosis and also carries with it a high risk of complications and morbidity.    The differential diagnosis includes dental abscess, dental infection  Additional history obtained: Additional history obtained from spouse Records reviewed previous admission documents  ED Course and Reassessment:   Consultation: - Consulted or discussed management/test interpretation w/ external professional: None  Consideration for admission or further workup: Considered fracture and further workup however patient's vital signs and physical exam are reassuring.  Patient symptoms likely due to dental infection.  There is no obvious abscess to drain in the ER.  Patient started on Augmentin outpatient antibiotic and given dental resources to follow-up with dentistry for further evaluation and workup.  Patient given strict return precautions.        Final Clinical Impression(s) / ED Diagnoses Final diagnoses:  Dental infection    Rx / DC Orders ED Discharge Orders          Ordered    amoxicillin-clavulanate (AUGMENTIN) 875-125 MG tablet  Every 12 hours        04/06/23 1716              Dolphus Jenny, PA-C 04/06/23 1724    Terrilee Files, MD 04/07/23 1049

## 2023-04-06 NOTE — ED Triage Notes (Signed)
Pt reports right upper tooth pain and swelling since yesterday.

## 2023-04-07 ENCOUNTER — Inpatient Hospital Stay (HOSPITAL_COMMUNITY)
Admission: AD | Admit: 2023-04-07 | Payer: Medicaid Other | Source: Home / Self Care | Admitting: Obstetrics and Gynecology

## 2023-04-07 DIAGNOSIS — Z01818 Encounter for other preprocedural examination: Secondary | ICD-10-CM

## 2023-04-07 DIAGNOSIS — Z3483 Encounter for supervision of other normal pregnancy, third trimester: Secondary | ICD-10-CM

## 2023-04-11 ENCOUNTER — Ambulatory Visit: Payer: Medicaid Other | Admitting: Women's Health

## 2023-04-11 ENCOUNTER — Telehealth (HOSPITAL_COMMUNITY): Payer: Self-pay

## 2023-04-11 ENCOUNTER — Encounter: Payer: Self-pay | Admitting: Women's Health

## 2023-04-11 VITALS — BP 125/78 | HR 92 | Ht 61.0 in | Wt 158.0 lb

## 2023-04-11 DIAGNOSIS — Z4889 Encounter for other specified surgical aftercare: Secondary | ICD-10-CM | POA: Diagnosis not present

## 2023-04-11 NOTE — Telephone Encounter (Signed)
 04/11/2023 0935  Name: Janet Dixon MRN: [id number] DOB: 12-03-1985  Reason for Call:  Transition of Care Hospital Discharge Call  Contact Status: Patient Contact Status: Message  Language assistant needed:          Follow-Up Questions:    Van Postnatal Depression Scale:  In the Past 7 Days:    PHQ2-9 Depression Scale:     Discharge Follow-up:    Post-discharge interventions: NA  Signature  Rosaline Deretha PEAK

## 2023-04-11 NOTE — Progress Notes (Signed)
   GYN VISIT Patient name: Janet Dixon MRN [id number]  Date of birth: [birth year] Chief Complaint:   Postpartum Care (1 wk post-op incision check. Need refill gabapentin  and oxycodone  )  History of Present Illness:   Janet Dixon is a 36 y.o. [phone] African-American female 9d s/p RCS being seen today for incision check. Requesting refill on oxycodone  and gabapentin . Taking 3 extra strength apap. Bottlefeeding. Baby is w/ family member. No dep/anx.     Patient's last menstrual period was 04/02/2023.     02/23/2023    9:47 AM 11/09/2022    1:55 PM 05/11/2017    2:00 PM 08/29/2016    3:09 PM  Depression screen PHQ 2/9  Decreased Interest 0 0 1 0  Down, Depressed, Hopeless 0 0 2 0  PHQ - 2 Score 0 0 3 0  Altered sleeping 0 0 2   Tired, decreased energy 1 0 2   Change in appetite 0 0 2   Feeling bad or failure about yourself  0 0 0   Trouble concentrating 0 0 0   Moving slowly or fidgety/restless 0 0 0   Suicidal thoughts 0 0 0   PHQ-9 Score 1 0 9         02/23/2023    9:48 AM 11/09/2022    1:56 PM  GAD 7 : Generalized Anxiety Score  Nervous, Anxious, on Edge 0 0  Control/stop worrying 0 0  Worry too much - different things 0 0  Trouble relaxing 0 0  Restless 0 0  Easily annoyed or irritable 0 0  Afraid - awful might happen 0 0  Total GAD 7 Score 0 0     Review of Systems:   Pertinent items are noted in HPI Denies fever/chills, dizziness, headaches, visual disturbances, fatigue, shortness of breath, chest pain, abdominal pain, vomiting, abnormal vaginal discharge/itching/odor/irritation, problems with periods, bowel movements, urination, or intercourse unless otherwise stated above.  Pertinent History Reviewed:  Reviewed past medical,surgical, social, obstetrical and family history.  Reviewed problem list, medications and allergies. Physical Assessment:   Vitals:   04/11/23 1345  BP: 125/78  Pulse: 92  Weight: 158 lb (71.7 kg)  Height: 5' 1 (1.549 m)  Body mass  index is 29.85 kg/m.       Physical Examination:   General appearance: alert, well appearing, and in no distress  Mental status: alert, oriented to person, place, and time  Skin: warm & dry   Cardiovascular: normal heart rate noted  Respiratory: normal respiratory effort, no distress  Abdomen: soft, non-tender, c/s incision healing well, no s/s infection  Pelvic: examination not indicated  Extremities: no edema   Chaperone: N/A    No results found for this or any previous visit (from the past 24 hours).  Assessment & Plan:  1) 9d s/p RCS w/ IUD insertion> bottlefeeding. Discussed can not refill oxycodone  and gabapentin . H/o polysubstance abuse. Can take apap 500-100mg  q 4hr prn (not to exceed 3000-4000mg /24hrs) or risk liver damage.   2) Incision check> healing well, keep clean and dry  3) Contraception counseling> s/p pp IUD, abstinence until pp visit/IUD check  Meds: No orders of the defined types were placed in this encounter.   No orders of the defined types were placed in this encounter.   Return for As scheduled.  Suzen JONELLE Fetters CNM, [facility] 04/11/2023 2:28 PM

## 2023-04-11 NOTE — Telephone Encounter (Signed)
 04/11/2023 1000  Name: Janet Dixon MRN: [id number] DOB: 09-13-1985  Reason for Call:  Transition of Care Hospital Discharge Call  Contact Status: Patient Contact Status: Message  Patient left RN a voicemail asking for a return call. RN attempted to call patient but got no answer. Voicemail left.  Language assistant needed:          Follow-Up Questions:    Van Postnatal Depression Scale:  In the Past 7 Days:    PHQ2-9 Depression Scale:     Discharge Follow-up:    Post-discharge interventions: NA  Signature  Rosaline Deretha PEAK

## 2023-05-09 ENCOUNTER — Ambulatory Visit: Payer: Medicaid Other | Admitting: Women's Health

## 2023-05-16 ENCOUNTER — Other Ambulatory Visit: Payer: Self-pay

## 2023-05-16 ENCOUNTER — Emergency Department (HOSPITAL_COMMUNITY): Payer: Medicaid Other

## 2023-05-16 ENCOUNTER — Encounter (HOSPITAL_COMMUNITY): Payer: Self-pay

## 2023-05-16 ENCOUNTER — Emergency Department (HOSPITAL_COMMUNITY)
Admission: EM | Admit: 2023-05-16 | Discharge: 2023-05-16 | Disposition: A | Discharge status: Home / Self Care | Payer: Medicaid Other | Attending: Emergency Medicine | Admitting: Emergency Medicine

## 2023-05-16 DIAGNOSIS — R1030 Lower abdominal pain, unspecified: Secondary | ICD-10-CM | POA: Insufficient documentation

## 2023-05-16 LAB — COMPREHENSIVE METABOLIC PANEL
ALT: 15 U/L (ref 0–44)
AST: 17 U/L (ref 15–41)
Albumin: 3.6 g/dL (ref 3.5–5.0)
Alkaline Phosphatase: 62 U/L (ref 38–126)
Anion gap: 8 (ref 5–15)
BUN: 12 mg/dL (ref 6–20)
CO2: 23 mmol/L (ref 22–32)
Calcium: 9 mg/dL (ref 8.9–10.3)
Chloride: 105 mmol/L (ref 98–111)
Creatinine, Ser: 0.78 mg/dL (ref 0.44–1.00)
GFR, Estimated: >60 mL/min (ref >60)
Glucose, Bld: 93 mg/dL (ref 70–99)
Potassium: 3.9 mmol/L (ref 3.5–5.1)
Sodium: 136 mmol/L (ref 135–145)
Total Bilirubin: 0.3 mg/dL (ref 0.0–1.2)
Total Protein: 6.8 g/dL (ref 6.5–8.1)

## 2023-05-16 NOTE — Discharge Instructions (Signed)
 You have request to go home without completing the workup including a urine sample to make sure there is no bladder infection or other testing.  You can return at any time if you change your mind.  I would recommend that you to keep your appointment on the sixth with your doctor.

## 2023-05-16 NOTE — ED Provider Triage Note (Signed)
 Emergency Medicine Provider Triage Evaluation Note  Janet Dixon , a 39 y.o. female  was evaluated in triage.  Pt complains of pelvic pain and vaginal bleeding.  Symptoms have been present since December.  She states she has a foul-smelling discharge.  She had IUD placed after vaginal delivery in early December.  She is had increasing pelvic pain since that time.  She denies any flank or back pain, fever or chills, vomiting or diarrhea.  Review of Systems  Positive: Pelvic pain, vaginal odor and vaginal discharge Negative: Fever, chills, back pain and dysuria  Physical Exam  BP (!) 135/104 (BP Location: Right Arm)   Pulse 85   Temp 98 F (36.7 C) (Oral)   Resp 17   Ht 5' (1.524 m)   Wt 59 kg   LMP 06/29/2022   SpO2 100%   Breastfeeding No   BMI 25.39 kg/m  Gen:   Awake, no distress   Resp:  Normal effort  MSK:   Moves extremities without difficulty    Medical Decision Making  Medically screening exam initiated at 5:14 PM.  Appropriate orders placed.  OBERA STAUCH was informed that the remainder of the evaluation will be completed by another provider, this initial triage assessment does not replace that evaluation, and the importance of remaining in the ED until their evaluation is complete.     Herlinda Milling, PA-C 05/16/23 8283

## 2023-05-16 NOTE — ED Provider Notes (Signed)
 [facility] AT [facility] Provider Note   CSN: [id number] Arrival date & time: 05/16/23  9047     History  Chief Complaint  Patient presents with   Abdominal Pain    Janet Dixon is a 38 y.o. female.   Abdominal Pain  This patient is a 38 year old female, she presents to the hospital with lower abdominal pain, she had an IUD placed a couple of months ago after delivering a baby, since that time she has had some lower abdominal pain, she denies urinary symptoms, denies fevers chills nausea or vomiting, endorses having multiple sexual partners.  The patient is concerned that her IUD may have changed location because of the discomfort which she is having.  She still has spotting intermittently over time.    Home Medications Prior to Admission medications   Medication Sig Start Date End Date Taking? Authorizing Provider  acetaminophen  (TYLENOL ) 500 MG tablet Take 2 tablets (1,000 mg total) by mouth every 8 (eight) hours as needed. 04/05/23   Ozan, Jennifer, DO  albuterol  (VENTOLIN  HFA) 108 (90 Base) MCG/ACT inhaler Inhale 2 puffs into the lungs every 6 (six) hours as needed for wheezing or shortness of breath. 12/19/22   Kizzie Suzen SAUNDERS, CNM  amoxicillin -clavulanate (AUGMENTIN ) 875-125 MG tablet Take 1 tablet by mouth every 12 (twelve) hours. 04/06/23   Keith, Kayla N, PA-C  gabapentin  (NEURONTIN ) 300 MG capsule Take 1 capsule (300 mg total) by mouth 3 (three) times daily for 4 days. For pain management 04/05/23 04/09/23  Ozan, Jennifer, DO  metroNIDAZOLE  (FLAGYL ) 500 MG tablet Take 1 tablet (500 mg total) by mouth 2 (two) times daily. Patient not taking: Reported on 04/11/2023 03/23/23   Ozan, Jennifer, DO  oxyCODONE -acetaminophen  (PERCOCET) 5-325 MG tablet Take 1 tablet by mouth every 4 (four) hours as needed for severe pain (pain score 7-10). 04/03/23   Jayne Vonn DEL, MD  Prenatal Vit-Fe Fumarate-FA (MULTIVITAMIN-PRENATAL) 27-0.8 MG TABS tablet Take 1  tablet by mouth daily at 12 noon.    [provider]  potassium chloride  (K-DUR) 10 MEQ tablet Take 1 tablet (10 mEq total) by mouth 2 (two) times daily for 3 days. Patient not taking: Reported on 11/08/2018 08/05/18 01/09/19  Ladell Lenis, DO      Allergies    Macrobid  [nitrofurantoin  macrocrystal], Ibuprofen , and Flexeril [cyclobenzaprine hcl]    Review of Systems   Review of Systems  Gastrointestinal:  Positive for abdominal pain.  All other systems reviewed and are negative.   Physical Exam Updated Vital Signs BP (!) 135/104 (BP Location: Right Arm)   Pulse 85   Temp 98 F (36.7 C) (Oral)   Resp 17   Ht 1.524 m (5')   Wt 59 kg   LMP 06/29/2022   SpO2 100%   Breastfeeding No   BMI 25.39 kg/m  Physical Exam Vitals and nursing note reviewed.  Constitutional:      General: She is not in acute distress.    Appearance: She is well-developed.  HENT:     Head: Normocephalic and atraumatic.     Mouth/Throat:     Pharynx: No oropharyngeal exudate.  Eyes:     General: No scleral icterus.       Right eye: No discharge.        Left eye: No discharge.     Conjunctiva/sclera: Conjunctivae normal.     Pupils: Pupils are equal, round, and reactive to light.  Neck:     Thyroid: No thyromegaly.  Vascular: No JVD.  Cardiovascular:     Rate and Rhythm: Normal rate and regular rhythm.     Heart sounds: Normal heart sounds. No murmur heard.    No friction rub. No gallop.  Pulmonary:     Effort: Pulmonary effort is normal. No respiratory distress.     Breath sounds: Normal breath sounds. No wheezing or rales.  Abdominal:     General: Bowel sounds are normal. There is no distension.     Palpations: Abdomen is soft. There is no mass.     Tenderness: There is no abdominal tenderness.  Musculoskeletal:        General: No tenderness. Normal range of motion.     Cervical back: Normal range of motion and neck supple.  Lymphadenopathy:     Cervical: No cervical  adenopathy.  Skin:    General: Skin is warm and dry.     Findings: No erythema or rash.  Neurological:     Mental Status: She is alert.     Coordination: Coordination normal.  Psychiatric:        Behavior: Behavior normal.     ED Results / Procedures / Treatments   Labs (all labs ordered are listed, but only abnormal results are displayed) Labs Reviewed  COMPREHENSIVE METABOLIC PANEL  URINALYSIS, ROUTINE W REFLEX MICROSCOPIC  CBC WITH DIFFERENTIAL/PLATELET  PREGNANCY, URINE  CBC WITH DIFFERENTIAL/PLATELET    EKG None  Radiology US  Pelvis Complete Result Date: 05/16/2023 CLINICAL DATA:  Pelvic pain near C-section scar. EXAM: TRANSABDOMINAL ULTRASOUND OF PELVIS TECHNIQUE: Transabdominal ultrasound examination of the pelvis was performed including evaluation of the uterus, ovaries, adnexal regions, and pelvic cul-de-sac. It should be noted that while a transvaginal study was ordered this portion was declined by the patient. COMPARISON:  March 23, 2023 FINDINGS: Uterus Measurements: 12.7 cm x 4.0 cm x 7.4 cm = volume: 194.1 mL. No fibroids or other mass visualized. Endometrium Thickness: N/A. An IUD is seen within the endometrial canal within the region of the uterine fundus. Right ovary Measurements: 3.9 cm x 1.8 cm x 3.5 cm = volume: 12.5 mL. A 2.7 cm x 1.5 cm x 2.6 cm simple right ovarian cyst is noted. Left ovary The left ovary is not visualized secondary to overlying bowel gas. Other findings:  No abnormal free fluid. IMPRESSION: 1. Limited study secondary to overlying bowel gas. 2. IUD in place. Electronically Signed   By: Suzen Dials M.D.   On: 05/16/2023 17:51    Procedures Procedures    Medications Ordered in ED Medications - No data to display  ED Course/ Medical Decision Making/ A&P                                 Medical Decision Making Amount and/or Complexity of Data Reviewed Labs: ordered.   The patient has no abdominal tenderness, she refuses to give  a urine sample and states that she just wants to go home, she does not want any further testing including STD testing, she states she has a doctor's appointment in 2 days and can follow-up there.  She appears hemodynamically stable and has Medical Decision Making capacity.        Final Clinical Impression(s) / ED Diagnoses Final diagnoses:  Lower abdominal pain    Rx / DC Orders ED Discharge Orders     None         Cleotilde Rogue, MD 05/16/23 [phone]

## 2023-05-16 NOTE — ED Triage Notes (Signed)
Pt arrive with reports of foul smelling discharge and feeling like her IUD has moved. Also reports multiple sexual partners recently. Also reports seeing discharge on one of her sexual partners.

## 2023-10-04 ENCOUNTER — Other Ambulatory Visit: Payer: Self-pay
